# Patient Record
Sex: Female | Born: 1940 | Race: White | Hispanic: No | Smoking: Former smoker
Health system: Southern US, Community
[De-identification: ages and names within clinical notes are randomized; demographics above are authoritative.]

## PROBLEM LIST (undated history)

## (undated) DIAGNOSIS — Z8489 Family history of other specified conditions: Secondary | ICD-10-CM

## (undated) DIAGNOSIS — Z973 Presence of spectacles and contact lenses: Secondary | ICD-10-CM

## (undated) DIAGNOSIS — I10 Essential (primary) hypertension: Secondary | ICD-10-CM

## (undated) DIAGNOSIS — I714 Abdominal aortic aneurysm, without rupture, unspecified: Secondary | ICD-10-CM

## (undated) DIAGNOSIS — Z975 Presence of (intrauterine) contraceptive device: Secondary | ICD-10-CM

## (undated) DIAGNOSIS — H9190 Unspecified hearing loss, unspecified ear: Secondary | ICD-10-CM

## (undated) DIAGNOSIS — T7840XA Allergy, unspecified, initial encounter: Secondary | ICD-10-CM

## (undated) DIAGNOSIS — M8580 Other specified disorders of bone density and structure, unspecified site: Secondary | ICD-10-CM

## (undated) DIAGNOSIS — R030 Elevated blood-pressure reading, without diagnosis of hypertension: Secondary | ICD-10-CM

## (undated) DIAGNOSIS — M1990 Unspecified osteoarthritis, unspecified site: Secondary | ICD-10-CM

## (undated) DIAGNOSIS — Z972 Presence of dental prosthetic device (complete) (partial): Secondary | ICD-10-CM

## (undated) DIAGNOSIS — Z96659 Presence of unspecified artificial knee joint: Secondary | ICD-10-CM

## (undated) DIAGNOSIS — N814 Uterovaginal prolapse, unspecified: Secondary | ICD-10-CM

## (undated) DIAGNOSIS — H269 Unspecified cataract: Secondary | ICD-10-CM

## (undated) DIAGNOSIS — M810 Age-related osteoporosis without current pathological fracture: Secondary | ICD-10-CM

## (undated) HISTORY — DX: Elevated blood-pressure reading, without diagnosis of hypertension: R03.0

## (undated) HISTORY — PX: JOINT REPLACEMENT: SHX530

## (undated) HISTORY — DX: Essential (primary) hypertension: I10

## (undated) HISTORY — PX: COLONOSCOPY: SHX174

## (undated) HISTORY — DX: Uterovaginal prolapse, unspecified: N81.4

## (undated) HISTORY — PX: WISDOM TOOTH EXTRACTION: SHX21

## (undated) HISTORY — DX: Allergy, unspecified, initial encounter: T78.40XA

## (undated) HISTORY — DX: Abdominal aortic aneurysm, without rupture: I71.4

## (undated) HISTORY — DX: Abdominal aortic aneurysm, without rupture, unspecified: I71.40

## (undated) HISTORY — DX: Age-related osteoporosis without current pathological fracture: M81.0

## (undated) HISTORY — DX: Unspecified cataract: H26.9

## (undated) HISTORY — DX: Unspecified osteoarthritis, unspecified site: M19.90

## (undated) HISTORY — PX: EYE SURGERY: SHX253

## (undated) HISTORY — DX: Other specified disorders of bone density and structure, unspecified site: M85.80

---
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
## 2001-07-05 HISTORY — PX: REPLACEMENT TOTAL KNEE BILATERAL: SUR1225

## 2001-08-16 ENCOUNTER — Encounter: Payer: Self-pay | Admitting: Orthopedic Surgery

## 2001-08-21 ENCOUNTER — Inpatient Hospital Stay (HOSPITAL_COMMUNITY): Admission: RE | Admit: 2001-08-21 | Discharge: 2001-08-24 | Payer: Self-pay | Admitting: Orthopedic Surgery

## 2001-10-04 ENCOUNTER — Encounter: Payer: Self-pay | Admitting: *Deleted

## 2001-10-04 ENCOUNTER — Encounter: Admission: RE | Admit: 2001-10-04 | Discharge: 2001-10-04 | Payer: Self-pay | Admitting: *Deleted

## 2001-11-22 ENCOUNTER — Ambulatory Visit (HOSPITAL_COMMUNITY): Admission: RE | Admit: 2001-11-22 | Discharge: 2001-11-22 | Payer: Self-pay | Admitting: Gastroenterology

## 2001-12-11 ENCOUNTER — Inpatient Hospital Stay (HOSPITAL_COMMUNITY): Admission: RE | Admit: 2001-12-11 | Discharge: 2001-12-14 | Payer: Self-pay | Admitting: Orthopedic Surgery

## 2002-10-23 ENCOUNTER — Encounter: Payer: Self-pay | Admitting: *Deleted

## 2002-10-23 ENCOUNTER — Encounter: Admission: RE | Admit: 2002-10-23 | Discharge: 2002-10-23 | Payer: Self-pay | Admitting: *Deleted

## 2003-11-08 ENCOUNTER — Encounter: Admission: RE | Admit: 2003-11-08 | Discharge: 2003-11-08 | Payer: Self-pay | Admitting: Internal Medicine

## 2004-11-12 ENCOUNTER — Encounter: Admission: RE | Admit: 2004-11-12 | Discharge: 2004-11-12 | Payer: Self-pay | Admitting: Internal Medicine

## 2005-12-08 ENCOUNTER — Encounter: Admission: RE | Admit: 2005-12-08 | Discharge: 2005-12-08 | Payer: Self-pay | Admitting: Internal Medicine

## 2006-08-02 ENCOUNTER — Other Ambulatory Visit: Admission: RE | Admit: 2006-08-02 | Discharge: 2006-08-02 | Payer: Self-pay | Admitting: *Deleted

## 2006-12-12 ENCOUNTER — Encounter: Admission: RE | Admit: 2006-12-12 | Discharge: 2006-12-12 | Payer: Self-pay | Admitting: *Deleted

## 2007-12-21 ENCOUNTER — Encounter: Admission: RE | Admit: 2007-12-21 | Discharge: 2007-12-21 | Payer: Self-pay | Admitting: Family Medicine

## 2008-12-23 ENCOUNTER — Encounter: Admission: RE | Admit: 2008-12-23 | Discharge: 2008-12-23 | Payer: Self-pay | Admitting: Family Medicine

## 2009-12-24 ENCOUNTER — Encounter: Admission: RE | Admit: 2009-12-24 | Discharge: 2009-12-24 | Payer: Self-pay | Admitting: Family Medicine

## 2010-11-20 NOTE — Op Note (Signed)
. [facility]  Patient:    Abigail Cohen, Abigail Cohen Visit Number: [id number] MRN: [id number]          Service Type: SUR Location: 5000 [id number] Attending Physician:  Carolan Shiver Ii Dictated by:   Carlisle Beers. Dorothyann Gibbs, M.D. Proc. Date: 12/11/01 Admit Date:  12/11/2001                             Operative Report  PREOPERATIVE DIAGNOSIS:  Osteoarthritis, left knee.  SURGICAL PROCEDURE:  Left LCS total knee replacement, fully cemented.  SURGEON:  John L. Dorothyann Gibbs, M.D.  ASSISTANTJerolyn Shin. Tresa Res, M.D.  ANESTHESIA:  General.  PATHOLOGY:  Bone-on-bone with severe varus deformity, left knee; with subluxation of the femur medially.  POSTOPERATIVE DIAGNOSES:  Osteoarthritis, left knee.  DESCRIPTION OF PROCEDURE:  Under general anesthesia, the left leg is prepared with Duraprep and draped as a sterile field.  A proximal sterile tourniquet is used, leg prepped out with the Esmarch and the tourniquet used at 350 mmHg. The knee is exposed through a midline incision.  The patella is everted. Dissection is carried medial to the parapatella.  Due to the tight medial compartment, a release of the MCL and periosteal sleeve from the medial tibia is done.  Following this the femur is sized to standard.  Using the first tibial guide, a proximal tibial resection is carried out using the first femoral guide, then a condylar drill hole is placed using the second guide. Anterior and posterior flare of the distal femur are resected with the 17.5 flexion gap.  The intermedullary guide is then used, and a distal femoral cut is made with a 17.5 extension gap.  Recessing guide is then used.  Attention is then turned to debris, and lamina spreaders inserted.  Remnants of the menisci and cruciates are resected.  The tibia is then incised to a 2.5.  The center peg hole and keel hole are placed.  Trial reduction of a 2.5 tibia with 17.5 bearing and standard femur  reveals excellent, fit alignment and stability.  The patella is osteotomized, three peg holes are placed.  The patellar component tracks midline.  The joint capsule is clamped together, and excellent stability is seen.  Permanent components are obtained, while the bony surfaces are flushed with pulse irrigation.  Permanent components are then all cemented in place.  It should be noted there is a medial cyst in the tibia, and this is packed with bone graft.  Attention is then turned to trimming spurs along the medial tibial plateau. Permanent components are cemented in place, and after the cement hardened the excess is removed.  Tourniquet is let down at slightly more than one hour. Hemovac drain is inserted, and multiple small vessels are cauterized.  The wound is then closed in layers with #1 Tycron, #1 Vicryl and 2-0 Vicryl; then skin clips.  OPERATIVE TIME:  Approximately  1 hr 20 min.  DISPOSITION:  The patient received a femoral nerve block and returned to recovery in good condition. Dictated by:   Carlisle Beers. Dorothyann Gibbs, M.D. Attending Physician:  Carolan Shiver Ii DD:  12/11/01 TD:  12/12/01 Job: [id number]

## 2010-11-20 NOTE — Procedures (Signed)
[facility]  Patient:    CALLI, BASHOR Visit Number: [id number] MRN: [id number]          Service Type: END Location: ENDO Attending Physician:  Dennison Bulla Ii Dictated by:   Verlin Grills, M.D. Proc. Date: 11/22/01 Admit Date:  11/22/2001 Discharge Date: 11/22/2001   CC:         John L. Dorothyann Gibbs, M.D.  Forde Dandy, M.D.   Procedure Report  PROCEDURE:  Screening colonoscopy.  REFERRING PHYSICIANS:  John L. Dorothyann Gibbs, M.D., Forde Dandy, M.D.  PROCEDURE INDICATION:  Ms. Eugenia Eldredge is a 70 year old female born June 18, 1941. Ms. Sasaki is referred to me for her first screening colonoscopy with polypectomy to prevent colon cancer. Her mother has undergone colonoscopic exams to remove colon polyps. There is no family history of colon cancer.  Ms. Moye viewed the colonoscopy education film. I discussed with her the complications associated with colonoscopy and polypectomy, including a 15 per 1000 risk of bleeding and 4 per 1000 risk of colon perforation requiring surgical repair. Ms. Fogarty has signed the operative permit.  MEDICATION ALLERGIES:  PENICILLIN.  CHRONIC MEDICATIONS:  Bextra, Sudafed, Benadryl, multivitamin with calcium, aspirin.  PAST MEDICAL HISTORY:  Knee replacement surgery. Oral surgery.  FAMILY HISTORY:  Negative for colon cancer.  ENDOSCOPIST:  Verlin Grills, M.D.  PREMEDICATION:  Versed 5 mg, Demerol 50 mg.  ENDOSCOPE:  Olympus Pediatric colonoscope.  DESCRIPTION OF PROCEDURE:  After obtaining informed consent, Ms. Hunnell was placed in the left lateral decubitus position. I administered intravenous Demerol and intravenous Versed to achieve conscious sedation for the procedure. The patients blood pressure, oxygen saturation and cardiac rhythm were monitored throughout the procedure and documented in the medical record.  Anal inspection was normal. Digital rectal exam was  normal. The Olympus Pediatric video colonoscope was introduced into the rectum and advanced to the cecum. Colonic preparation for the exam today was excellent.  Rectum:  Normal.  Sigmoid colon:  Normal.  Descending colon:  Normal.  Splenic flexure:  Normal.  Transverse colon:  Normal.  Hepatic flexure:  Normal.  Ascending colon:  Normal.  Cecum and ileocecal valve:  Normal.  ASSESSMENT:  Normal screening proctocolonoscopy to the cecum. No endoscopic evidence for the presence of colorectal neoplasia. Dictated by:   Verlin Grills, M.D. Attending Physician:  Dennison Bulla Ii DD:  11/22/01 TD:  11/24/01 Job: [id number]

## 2010-11-20 NOTE — H&P (Signed)
[place]. [facility]  Patient:    Abigail Cohen, Abigail Cohen Visit Number: [id number] MRN: [id number]          Service Type: END Location: ENDO Attending Physician:  Dennison Bulla Ii Dictated by:   Jamelle Rushing, P.A. Admit Date:  11/22/2001 Discharge Date: 11/22/2001                           History and Physical  DATE OF BIRTH:  Aug 16, 1940.  CHIEF COMPLAINT:  Right knee pain for several years.  HISTORY OF PRESENT ILLNESS:  The patient is a 70 year old white female with a history of bilateral knee pain for several years.  The patient had a right total knee arthroplasty on August 21, 2001, with good results.  The patient states that the left knee pain has not improved since having the right total knee arthroplasty.  The patient indicates the pain is still present primarily while ambulating.  It is described as a sharp, stabbing-type pain located directly in the knee with any weightbearing-type activity.  It does improve when she is off her feet and using Bextra.  She does have some popping and grinding in the knee.  There is no radiation of the pain.  It does bother her at night.  She does have some swelling in the knee.  She is still currently using a cane to assist with ambulation from previous total knee arthroplasty.  X-rays revealed severe end-stage osteoarthritis with varus deformity.  ALLERGIES:  PENICILLIN.  MEDICATIONS: 1. Bextra 20 mg p.o. b.i.d. 2. Aspirin 325 mg p.o. q.d., discontinued previous to this date. 3. Multivitamins with calcium.  PAST MEDICAL HISTORY:  The patient denies any significant issues in the past with any medical concern, especially diabetes, hypertension, thyroid disease, hiatal hernia, peptic ulcer, heart disease, asthma.  PAST SURGICAL HISTORY: 1. Tonsillectomy in 1945. 2. Wisdom tooth in 2001. 3. Right total knee arthroplasty in 2003.  The patient denies any complications of the above-mentioned  surgical procedures.  SOCIAL HISTORY:  The patient is a short, obese white female, 70 years of age. Smokes approximately one-half pack of cigarettes a day for the last 40 years. Denies alcohol use.  Is widowed.  She does have two grown children.  She lives in a two-story house.  She is currently retired.  FAMILY PHYSICIAN:  Media planner at [employer].  FAMILY HISTORY:  Mother is deceased of age-related issues at 20.  Father is deceased from stroke.  The patient has one sister, who is alive and in good health.  REVIEW OF SYSTEMS:  Negative for all categories with the exception of the patient does have a partial plate and she does use glasses at all times.  PHYSICAL EXAMINATION:  VITAL SIGNS:  The patients height is 5 feet 1 inch, weight 170 pounds.  Blood pressure is 142/90, pulse of 84 and regular, respirations 12, temperature is 98.8.  GENERAL:  This is a healthy-appearing, well-developed white female, short in stature.  She walks with a significant left-sided limp.  She does carry her right leg slightly straight and stiff-legged.  She carries the cane in her right hand.  She is able to get on and off the exam table without any difficulty.  HEENT:  Head was normocephalic, atraumatic.  Nontender over the maxillary and frontal sinuses.  Pupils are equal, round and reactive, accommodating to light.  Extraocular movements intact.  Sclerae are not icteric.  Conjunctivae  are pink and moist.  Nasal septum was midline, mucous membranes pink and moist with no polyps noted.  Oral buccal mucosa was pink and moist without lesions. A partial plate was in place.  The rest of the dentition was in good repair. Uvula moved symmetrically with phonation.  The patient was able to swallow without any difficulty.  External ears were without deformities, canals patent, TMs pearly-gray and intact, gross hearing is intact.  NECK:  Supple.  No palpable lymphadenopathy.  Thyroid gland was  nontender. The patient had excellent range of motion of her cervical spine without any difficulty or tenderness.  CHEST:  Lungs were clear and equal bilaterally, no wheezes, rales, rhonchi, or rubs noted.  CARDIAC:  Regular rate and rhythm.  S1 and S2 were auscultated.  No murmurs, gallops, or rubs noted.  ABDOMEN:  Round, obese, soft, nontender to deep palpation.  Bowel sounds were normoactive throughout.  She had no tenderness to CVA percussion.  EXTREMITIES:  Upper extremities were symmetric size and shape.  She had excellent range of motion of her shoulders, elbows, and wrists without any difficulty.  Motor strength of 5/5 in all muscle groups tested.  Lower extremities:  Bilateral hips had full extension, flexion up to 110 degrees, mostly limited by abdominal girth, 20 degrees internal and external rotation without any difficulty or symptoms.  Right knee had a well-healed midline surgical incision with no sign of erythema or ecchymosis.  No palpable effusion.  Range of motion was 5-92 degrees, no valgus-varus laxity, no instability about the knee.  The calf was nontender.  Left knee had no sign of erythema or ecchymosis, no palpable effusion, was round and boggy-appearing. She did have medial and lateral joint line tenderness.  She had about 10 degrees valgus-varus laxity, no anterior or posterior drawer.  She had approximately a 7-10 degree varus deformity.  Range of motion was from 5-95 degrees.  No calf tenderness.  Bilateral ankles were symmetrical with good dorsiflexion and plantar flexion.  NEUROLOGIC:  The patient was conscious, alert, and appropriate, and held a decent conversation with the examiner.  Cranial nerves II-XII were grossly intact.  Deep tendon reflexes of the upper and lower extremities were brisk at 2+ and with generally light touch sensation is intact.  PERIPHERAL VASCULATURE:  Carotid pulses 2+, no bruits.  Radial pulses 2+. Unable to palpate femoral  pulses.  Dorsalis pedis and posterior tibial pulses were 1+.  The patient had 1+ nonpitting edema.  She had no significant  varicosity or venous stasis changes.  BREASTS, RECTAL, GENITOURINARY:  Deferred at this time.  IMPRESSION: 1. End-stage osteoarthritis, left knee, with varus deformity. 2. Obesity.  PLAN:  The patient will be admitted to [facility] on June 9 under the care of John L. Dorothyann Gibbs, M.D.  The patient will undergo all routine labs and tests prior to having a left total knee arthroplasty performed. Dictated by:   Jamelle Rushing, P.A. Attending Physician:  Dennison Bulla Ii DD:  12/05/01 TD:  12/06/01 Job: [id number]

## 2010-11-20 NOTE — Discharge Summary (Signed)
[place]. [facility]  Patient:    Abigail Cohen, CLOER Visit Number: [id number] MRN: [id number]          Service Type: SUR Location: 5000 [id number] Attending Physician:  Carolan Shiver Ii Dictated by:   Jamelle Rushing, P.A.-C. Admit Date:  12/11/2001 Discharge Date: 12/14/2001   CC:         [facility]   Discharge Summary  ADMISSION DIAGNOSES: 1. End-stage osteoarthritis left knee with varus deformity. 2. Obesity.  DISCHARGE DIAGNOSES: 1. Left total arthroplasty. 2. Obesity. 3. Urinary tract infection.  HISTORY OF PRESENT ILLNESS:  The patient is a 70 year old white female with a history of bilateral knee pain for several years.  The patient had a right total knee arthroplasty performed in February 2003 with good results.  The patient is still having left knee pain with ambulation.  The pain is described as a sharp stabbing pain with weightbearing.  It improves with Bextra.  She does have popping and grinding and swelling.  She does have night pain.  No radiation of the pain.  She is currently using a cane to assist in ambulation. X-rays revealed end-stage osteoarthritis with varus deformity.  ALLERGIES:  PENICILLIN.  MEDICATIONS: 1. Bextra 20 mg p.o. b.i.d. 2. Aspirin 325 mg q.d. 3. Multivitamin with calcium.  SURGICAL PROCEDURE:  On December 11, 2001, the patient was taken to the OR by Dr. Jonny Ruiz Rendall assisted by Dr. Vear Clock.  Under general anesthesia the patient underwent a left LCS total knee replacement, fully cemented.  The patient tolerated this procedure well.  One Hemovac drain was left in place. Operative time was 1 hour and 20 minutes and the patient did receive a postoperative femoral nerve block.  The patient was transferred to the recovery room in good condition.  CONSULTATIONS:  Routine physical therapy, occupational therapy and case management consult was requested.  HOSPITAL COURSE:  On December 11, 2001, the patient  was admitted to [facility] under the care of Dr. Jonny Ruiz Rendall.  The patient was taken to the OR where a left total knee arthroplasty was performed.  The patient received a postoperative femoral nerve block and was placed on Arixta for routine DVT prophylaxis.  The patient then incurred a total of three days of postoperative care in which she had no significant untoward events.  The patients initial urinalysis did indicate the patient had a UTI so she was placed on Cipro for management of this condition.  Her vital signs remained stable.  She did develop a low grade temperature.  Otherwise no other complaints.  Her wound remained benign for any signs of infection.  Her leg remained neuromotor and vascularly intact. The patient worked very well with physical therapy and was ready for discharge to home on postoperative #72 with no further complaints and working well with physical therapy.  LABORATORY DATA:  H&H on December 14, 2001, hemoglobin 10.0, hematocrit 28.8. Routine chemistry on December 13, 2001, sodium 137, potassium 3.7, glucose 117, BUN 11, creatinine 0.7.  Urinalysis on admission was positive for nitrates and too numerous to count bacteria.  MEDICATIONS: 1. Colace 100 mg p.o. b.i.d. 2. Senokot 8.6 mg p.o. b.i.d. 3. Arixta 2.5 mg subcu q.24h. 4. Laxative or enema of choice p.r.n. 5. Tylenol 650 mg p.o. q.4h. p.r.n. 6. Robaxin 500 mg p.o. q.6h. p.r.n. 7. Percocet one or two tablets every four to six hours p.r.n. 8. OxyContin CR 10 mg p.o. q.12h. p.r.n. 9. Cipro 500 mg  p.o. b.i.d.  DISCHARGE INSTRUCTIONS: 1. Medications:  The patient is to resume routine home medications.    Percocet 5 mg one or two tablets every four to six hours for    pain as needed.  OxyContin CR 10 mg two tablets every 12 hours    for five days and then one tablet every 12 hours for five more    days.  Arixta 2.5 mg injection for seven days and then aspirin for    the next month. 2. Pain  management as noted above. 3. Activity as tolerated. 4. Diet:  No restrictions. 5. Wound care:  Keep wound clean and dry, check daily for any    signs of infection.  Call physician if any problems. 6. Followup: Call for a followup appointment in 10 days with    Dr. Priscille Kluver.  DISCHARGE CONDITION:  The patients condition on discharge to home is listed as good. Dictated by:   Jamelle Rushing, P.A.-C. Attending Physician:  Carolan Shiver Ii DD:  01/20/02 TD:  01/26/02 Job: [id number]

## 2010-11-20 NOTE — Op Note (Signed)
[place]. [facility]  Patient:    Abigail Cohen, Abigail Cohen Visit Number: [id number] MRN: [id number]          Service Type: SUR Location: RCRM 2550 09 Attending Physician:  Carolan Shiver Ii Dictated by:   Carlisle Beers. Dorothyann Gibbs, M.D. Proc. Date: 08/21/01 Admit Date:  08/21/2001                             Operative Report  PREOPERATIVE DIAGNOSIS:  Osteoarthritis of right knee.  SURGICAL PROCEDURE:  Right LCS total knee replacement.  POSTOPERATIVE DIAGNOSIS:  Osteoarthritis of right knee.  SURGEON:  John L. Rendall III, M.D.  ASSISTANT:  Arnoldo Morale, P.A.-C  ANESTHESIA:  General.  PATHOLOGY:  Varus knee with bone-against-bone medial compartment.  All compartments are severely worn with spurs in all three compartments.  DESCRIPTION OF PROCEDURE:  Under general anesthesia, the right leg is prepared with Duraprep and draped as a sterile field.  A sterile proximal thigh tourniquet is applied.  The leg is wrapped out with the Esmarch, and the tourniquet is used to 350 mm.  A midline incision is made.  The patella is everted.  The femur is sized to a standard.  Proximal tibial resection is carried out using the tibial guide.  Using the first femoral guide, an intercondylar drill hole is placed, using the second guide, the anterior and posterior flare of the distal femur are resected with a 15 mm flexion gap. The intramedullary guide is then used, and a distal femoral cut is made with a 15 mm extension gap.  Recessing guide is then used.  Following this, the lamina spreader is inserted, the remnants of the cruciate and menisci are resected, spurs off the back of the femoral condyle, and a large loose body from the popliteal space is removed.  Following this, the tibia is exposed. It is sized to 2.5.  A center peg hole is placed with fins.  Trial reduction of a 2.5 tibia 15 mm bearing, standard femur reveals laxity medially, and a 17.5 bearing is then used, and  excellent balancing is then encountered.  The patella is osteotomized.  Three peg holes are placed.  The permanent components are then cemented in place after trial reduction revealed excellent fit and alignment.  Tourniquet is let down at approximately 1 hour.  The medium Hemovac drain is inserted.  The wound is then closed in layers after completing cauterization of multiple small vessels.  Leg is closed in layers of #1 surgidac, 0 and 2-0 Vicryl, and skin clips.  Operative time was 1 hour and 25 minutes.  The patient tolerated the procedure well, and returned to recovery in good condition. Dictated by:   Carlisle Beers. Dorothyann Gibbs, M.D. Attending Physician:  Carolan Shiver Ii DD:  08/21/01 TD:  08/21/01 Job: [id number]

## 2010-11-20 NOTE — H&P (Signed)
[place]  Patient:    Abigail Cohen, Abigail Cohen. Visit Number: [id number] MRN: [id number]          Service Type: Attending:  Carlisle Beers. Dorothyann Gibbs, M.D. Dictated by:   Jamelle Rushing, P.A. Adm. Date:  08/21/01                           History and Physical  DATE OF BIRTH:  April 24, 1941  CHIEF COMPLAINT:  Bilateral knee pain several years.  HISTORY OF PRESENT ILLNESS:  The patient is a 70 year old white female with bilateral knee pain for many years.  The patient states around the first of this year she banged her right knee, causing significant increase in pain and difficulty with range of motion.  The patient went to her primary care physician with [facility] in [place].  She had evaluations and had recommendations for further evaluations by an orthopedist.  The patient states that she has sharp pain in bilateral knees with weight bearing, that it is currently improving somewhat with Bextra.  She does have significant popping, grinding in the knees with any type of weight-bearing activity or range of motion of the knee.  She does have swelling.  She does have occasional night pain.  The patient is currently using a brace on her right knee for one month to improve her discomfort in her right knee.  The patient states that bilateral knee pain is currently worse in the right than the left.  There is no radiation of this discomfort.  X-rays reveal severe end-stage osteoarthritis, bilateral knees with a varus deformity bilaterally.  There is subluxation of the femur medially over the tibia bilaterally, greater on the left than the right.  ALLERGIES:  PENICILLIN.  CURRENT MEDICATIONS: 1. Bextra 20 mg p.o. b.i.d. 2. Aspirin 325 mg p.o. q.d. 3. Multivitamin with calcium.  MEDICAL HISTORY:  The patient denies any significant medical issues including diabetes, hypertension, thyroid disease, hiatal hernia, peptic ulcers, heart disease,  or asthma.  SURGICAL HISTORY: 1. Tonsillectomy in 1945. 2. Oral wisdom tooth surgery in 2001.  The patient denies any complications with any of the above mentioned surgical procedures.  SOCIAL HISTORY:  The patient is a short in stature, slightly heavy set white female, who states she currently smokes less than one half pack a day for the last 40 years.  She denies any alcohol.  She is widowed.  She does have two grown children.  She lives in a two story house with complete living facilities on one floor.  She is currently retired.  FAMILY PHYSICIAN:  Media planner at [employer].  Cardiology evaluation on August 15, 2001, by Dr. Katrinka Blazing with an adenosine Cardiolite test and stress test.  FAMILY MEDICAL HISTORY:  Mother is deceased with age related issues at 61 years of age.  Father is deceased from stroke complications.  The patient has got one sister alive and in good health.  REVIEW OF SYSTEMS:  Negative for any significant issues in general, respiratory, cardiac, GI, GU, hematologic, musculoskeletal, neurologic, or muscles, or mental status issues.  PHYSICAL EXAMINATION:  VITAL SIGNS:  Height is 5 feet 1 inch, weight is 175, pulse 92 and regular, respirations 12, temperature is 98.8, blood pressure is 128/88.  GENERAL:  This is a healthy-appearing white female, short in stature, slightly obese.  She does ambulate with a significant right-sided limp.  She appears to be bowlegged.  She does have long leg immobilizer on her right lower extremity.  She has a fair amount of difficulty getting on and off the exam table.  HEENT:  Head is normocephalic, atraumatic, nontender over maxillary or frontal sinuses.  Pupils are equal, round, and reactive, accommodating to light. Extraocular movements intact.  Sclerae are nonicteric.  Conjunctivae are pink and moist.  External ears without deformities.  Canals patent on the right, cerumen impacted on the left.  Gross hearing  is intact.  TMs are intact. Nasal septum was midline.  Mucous membranes pink and moist, no polyps noted. Oral buccal mucosa was pink and moist without lesions.  Dentition was in good repair.  Uvula was midline and the patient was able to swallow without any difficulty.  CHEST:  Lung sounds were clear and equal bilaterally.  No wheezes, rales, rhonchi, or rubs noted.  HEART:  Regular rate and rhythm, S1 and S2 were auscultated.  No murmurs, rubs, or gallops noted.  ABDOMEN:  Round, obese, soft, nontender to deep palpation.  Unable to palpate any hepatosplenomegaly.  SCVA was nontender to percussion.  Bowel sounds were normoactive throughout all four quadrants.  EXTREMITIES:  Upper extremities were symmetrically sized and shaped with full range of motion of shoulders, elbows, and wrists without any difficulty. Motor strength was 5/5.  Lower extremities:  Right and left hips had full range of motion with extension and flexion at 20 degrees internal/external rotation without any mechanical symptoms or difficulty.  Right knee had a long leg immobilizer on. With the removal of immobilizer, range of motion was 5 degrees to 90 degrees. She was tender along the medial and lateral joint line.  No signs of erythema or ecchymosis.  She had very coarse crepitus under the kneecap with her range of motion.  She had no anterior or posterior draw.  No significant valgus/varus laxity.  Left knee had no sign of erythema or ecchymosis.  No palpable effusion.  She did have medial and lateral joint line tenderness. She had significant coarse crepitus under the patella with range of motion, which was about 2 degrees to 90 degrees.  The calves were nontender. Bilateral ankles were symmetrical with good dorsiplantar flexion.  Peripheral vasculature:  Carotid pulses 2+, no bruits.  Radial pulses 2+, unable to palpate femoral pulses or posterior tibial.  The dorsalis pedis pulses were 1+.  She had no lower  extremity varicosities or pigmentation changes.  She had slight nonpitting edema.   NEUROLOGIC:  The patient was conscious, alert, and appropriate, held an easy conversation with the examiner.  Cranial nerves II-XII were grossly intact. Deep tendon reflexes of the upper and lower extremities were symmetrical, right to left brisk.  The patient was grossly intact to light touch and sensation from head to toe.  BREASTS/RECTAL/GENITOURINARY:  Exams deferred at this time.  IMPRESSION: 1. Severe end-stage osteoarthritis, bilateral knees with varus deformity,    right more symptomatic than the left. 2. Obesity. 3. Tobacco use.  PLAN:  The patient will be admitted to [facility] on August 21, 2001.  The patient has had a physical exam by her primary care physician and she has also had a cardiac workup by Dr. Katrinka Blazing.  I presently do not have these results, but will receive them prior to hospitalization and they will be forwarded to the patients chart.  The patient will undergo all of the routine labs and tests prior to this surgical procedure of a right total knee arthroplasty February 17. Dictated  by:   Jamelle Rushing, P.A. Attending:  Carlisle Beers. Dorothyann Gibbs, M.D. DD:  08/15/01 TD:  08/15/01 Job: [id number]

## 2010-11-20 NOTE — Discharge Summary (Signed)
[place]. [facility]  Patient:    Abigail Cohen, Abigail Cohen Visit Number: [id number] MRN: [id number]          Service Type: SUR Location: [id number] Attending Physician:  Carolan Shiver Ii Dictated by:   Jamelle Rushing, P.A.-C Admit Date:  12/11/2001 Discharge Date: 12/14/2001                             Discharge Summary  ADMISSION DIAGNOSES: 1. Severe end stage osteoarthritis of bilateral knees with varus deformity,    currently right knee more symptomatic than the left. 2. Obesity. 3. Tobacco use.  DISCHARGE DIAGNOSES: 1. Right total knee arthroplasty. 2. Obesity. 3. Tobacco use.  HISTORY OF PRESENT ILLNESS:  The patient is a 70 year old white female with bilateral knee pain for several years now.  The patient recently banged her right knee and has had significant increase in pain and difficulty with range of motion in that knee.  The patient has been evaluated in the past by her primary care physician, and recommended for an orthopedic evaluation.  The patient presents presently with right greater than left knee pain, but both have sharp shooting pains with any type of weightbearing activity and the increased amount of time on her feet.  The patient denies any radiation or pain.  She does have popping and grinding and slight swelling.  She does have significant night pain.  The patient has noted an increasing deformity in bilateral knees.  X-ray reveal end stage osteoarthritis bilateral knees, varus deformity with a subluxed femur medially over the tibia, left worse than right.  ALLERGIES:  PENICILLIN.  CURRENT MEDICATIONS: 1. Bextra 20 mg p.o. b.i.d. 2. Aspirin 325 mg p.o. q.d. 3. Multivitamin with calcium.  SURGICAL PROCEDURE:  On August 21, 2001, the patient was taken to the operating room by Dr. Jonny Ruiz L. Rendall, assisted by Arnoldo Morale, P.A.-C.  Under general anesthesia, the patient underwent a right LCS total knee replacement. The  patient tolerated the procedure well.  Operative time was 1 hour and 25 minutes.  The patient had a femoral nerve block placed prior to being discharged to the recovery room and then to the orthopedic floor.  CONSULTATIONS:  Physical therapy, occupational therapy, rehab, and case management.  HOSPITAL COURSE:  On August 21, 2001, the patient was admitted to [facility] under the care of Dr. Jonny Ruiz L. Rendall.  The patient was taken to the OR where a right total knee LCS knee replacement was performed.  The patient tolerated the procedure well.  Femoral nerve block was placed, and the patient was transferred to the recovery room and then to the orthopedic floor for routine postoperative care.  The patient was placed on Erixstra 2.5 mg subcutaneously q.d. for routine deep vein thrombosis prophylaxis.  The patient had an uneventful three day postoperative course on the orthopedic floor.  Her vital signs remained stable.  She remained afebrile with only a small low grade temperature on postoperative day #2.  Her leg remained neuromotorvascularly intact.  Her wound remained benign for any signs of infection.  She worked very well with physical therapy, and on postoperative day #3, after physical therapy in the morning and the afternoon the patient was comfortable and she was discharged to home in good condition.  LABORATORY DATA:  EKG on admission was normal sinus rhythm, possible left atrial enlargement at 89 beats per minute.  CBC on August 24, 2001,  white blood cell count 10.3, hemoglobin 9.2, hematocrit 26.7, platelets of 142.  The patient was asymptomatic for any signs of decrease in hemoglobin and hematocrit.  Routine chemistry on August 24, 2001, sodium 136, potassium 3.7, glucose 122, down from 141 the day previous, BUN 6, creatinine 0.7, increased glucoses due to stress of surgery.  Routine urinalysis on admission found many epithelial cells, large  leukocyte esterase, negative nitrites, many bacteria.  This was thought to be a contaminated specimen, and she also received routine postoperative antibiotics.  The patient denied any symptoms.  DISCHARGE MEDICATIONS FROM ORTHOPEDIC FLOOR:  1. Colace 100 mg p.o. b.i.d.  2. Senokot one tablet p.o. b.i.d. a.c.  3. Laxative or enema of choice p.r.n.  4. Reglan 10 mg p.o. q.8h. p.r.n.  5. Percocet one or two tablets q.4-6h. p.r.n.  6. Tylenol 650 mg p.o. q.4h. p.r.n.  7. Robaxin 500 mg p.o. q.6h. p.r.n.  8. Restoril 15 mg p.o. q.h.s. p.r.n.  9. Erixstra 2.5 mg subcutaneously q.24h. 10. Potassium chloride 20 mEq p.o. b.i.d. 11. OxyContin CR 10 mg p.o. q.12h.  DISCHARGE MEDICATIONS: 1. The patient is to resume all routine home medications. 2. OxyContin CR one tablet q.12h. 3. Oxy IR 5 mg one or two tablets q.4-6h. p.r.n. pain. 4. Trinsicon one tablet t.i.d. after meals. 5. Ambrose Mantle one injection for 7 days, then restart aspirin after completed    [place].  ACTIVITY:  As tolerated with the use of a walker, no driving, but may shower.  DIET:  No restrictions.  WOUND CARE:  Check daily for any signs of infection as instructed by physician.  If any problems, notify M.D.  [name]:  Call [phone] or the [facility] for a followup in 10 days.  CONDITION ON DISCHARGE:  Good. Dictated by:   Jamelle Rushing, P.A.-C Attending Physician:  Carolan Shiver Ii DD:  09/30/01 TD:  10/01/01 Job: [id number]

## 2011-01-11 ENCOUNTER — Other Ambulatory Visit: Payer: Self-pay | Admitting: Family Medicine

## 2011-01-11 DIAGNOSIS — Z1231 Encounter for screening mammogram for malignant neoplasm of breast: Secondary | ICD-10-CM

## 2011-01-21 ENCOUNTER — Ambulatory Visit
Admission: RE | Admit: 2011-01-21 | Discharge: 2011-01-21 | Disposition: A | Discharge status: Home / Self Care | Payer: Medicare Other | Source: Ambulatory Visit | Attending: Family Medicine | Admitting: Family Medicine

## 2011-01-21 DIAGNOSIS — Z1231 Encounter for screening mammogram for malignant neoplasm of breast: Secondary | ICD-10-CM

## 2011-02-03 ENCOUNTER — Other Ambulatory Visit (HOSPITAL_COMMUNITY)
Admission: RE | Admit: 2011-02-03 | Discharge: 2011-02-03 | Disposition: A | Discharge status: Home / Self Care | Payer: Medicare Other | Source: Ambulatory Visit | Attending: Family Medicine | Admitting: Family Medicine

## 2011-02-03 ENCOUNTER — Other Ambulatory Visit: Payer: Self-pay | Admitting: Physician Assistant

## 2011-02-03 DIAGNOSIS — Z124 Encounter for screening for malignant neoplasm of cervix: Secondary | ICD-10-CM | POA: Insufficient documentation

## 2011-07-28 DIAGNOSIS — N813 Complete uterovaginal prolapse: Secondary | ICD-10-CM | POA: Diagnosis not present

## 2011-07-28 DIAGNOSIS — N898 Other specified noninflammatory disorders of vagina: Secondary | ICD-10-CM | POA: Diagnosis not present

## 2011-07-28 DIAGNOSIS — N816 Rectocele: Secondary | ICD-10-CM | POA: Diagnosis not present

## 2011-07-28 DIAGNOSIS — N814 Uterovaginal prolapse, unspecified: Secondary | ICD-10-CM | POA: Diagnosis not present

## 2011-11-05 DIAGNOSIS — N952 Postmenopausal atrophic vaginitis: Secondary | ICD-10-CM | POA: Diagnosis not present

## 2011-11-05 DIAGNOSIS — N819 Female genital prolapse, unspecified: Secondary | ICD-10-CM | POA: Diagnosis not present

## 2012-01-14 ENCOUNTER — Other Ambulatory Visit: Payer: Self-pay | Admitting: Family Medicine

## 2012-01-14 DIAGNOSIS — Z1231 Encounter for screening mammogram for malignant neoplasm of breast: Secondary | ICD-10-CM

## 2012-02-01 ENCOUNTER — Ambulatory Visit: Payer: Medicare Other

## 2012-02-09 ENCOUNTER — Ambulatory Visit
Admission: RE | Admit: 2012-02-09 | Discharge: 2012-02-09 | Disposition: A | Discharge status: Home / Self Care | Payer: Medicare Other | Source: Ambulatory Visit | Attending: Family Medicine | Admitting: Family Medicine

## 2012-02-09 DIAGNOSIS — Z1231 Encounter for screening mammogram for malignant neoplasm of breast: Secondary | ICD-10-CM

## 2012-02-11 DIAGNOSIS — Z4689 Encounter for fitting and adjustment of other specified devices: Secondary | ICD-10-CM | POA: Diagnosis not present

## 2012-02-11 DIAGNOSIS — N816 Rectocele: Secondary | ICD-10-CM | POA: Diagnosis not present

## 2012-02-11 DIAGNOSIS — N813 Complete uterovaginal prolapse: Secondary | ICD-10-CM | POA: Diagnosis not present

## 2012-02-24 DIAGNOSIS — N952 Postmenopausal atrophic vaginitis: Secondary | ICD-10-CM | POA: Diagnosis not present

## 2012-02-24 DIAGNOSIS — Z8049 Family history of malignant neoplasm of other genital organs: Secondary | ICD-10-CM | POA: Diagnosis not present

## 2012-03-02 DIAGNOSIS — Z0000 Encounter for general adult medical examination without abnormal findings: Secondary | ICD-10-CM | POA: Diagnosis not present

## 2012-04-05 DIAGNOSIS — M899 Disorder of bone, unspecified: Secondary | ICD-10-CM | POA: Diagnosis not present

## 2012-04-05 DIAGNOSIS — M949 Disorder of cartilage, unspecified: Secondary | ICD-10-CM | POA: Diagnosis not present

## 2012-05-08 DIAGNOSIS — N819 Female genital prolapse, unspecified: Secondary | ICD-10-CM | POA: Diagnosis not present

## 2012-05-08 DIAGNOSIS — Z4689 Encounter for fitting and adjustment of other specified devices: Secondary | ICD-10-CM | POA: Diagnosis not present

## 2012-06-07 DIAGNOSIS — Z23 Encounter for immunization: Secondary | ICD-10-CM | POA: Diagnosis not present

## 2012-06-12 DIAGNOSIS — K4090 Unilateral inguinal hernia, without obstruction or gangrene, not specified as recurrent: Secondary | ICD-10-CM | POA: Diagnosis not present

## 2012-06-16 ENCOUNTER — Encounter (INDEPENDENT_AMBULATORY_CARE_PROVIDER_SITE_OTHER): Payer: Self-pay | Admitting: General Surgery

## 2012-06-20 ENCOUNTER — Encounter (INDEPENDENT_AMBULATORY_CARE_PROVIDER_SITE_OTHER): Payer: Self-pay | Admitting: Surgery

## 2012-06-20 ENCOUNTER — Ambulatory Visit (INDEPENDENT_AMBULATORY_CARE_PROVIDER_SITE_OTHER): Payer: Medicare Other | Admitting: Surgery

## 2012-06-20 VITALS — BP 124/72 | HR 92 | Temp 97.5°F | Resp 16 | Ht <= 58 in | Wt 136.8 lb

## 2012-06-20 DIAGNOSIS — K4090 Unilateral inguinal hernia, without obstruction or gangrene, not specified as recurrent: Secondary | ICD-10-CM | POA: Diagnosis not present

## 2012-06-20 NOTE — Progress Notes (Signed)
NAME: Abigail Cohen DOB: 08/30/1940 MRN: [id number]                                                                                      DATE: 06/20/2012  PCP: OWENS,REBECCA, FNP Referring Provider: Owens, Rebecca, FNP  IMPRESSION:  Reducible likely direct left inguinal hernia  PLAN:   repair of left inguinal hernia. We can do this as an outpatient under general or IV sedation and local anesthesia. We'll set this up to be done at her convenience. I have discussed with the patient the fact that he has an inguinal hernia and reviewed the pathophysiology of that diagnosis. I have given him educational materials about this. I discussed options for treatment including observation or repair and have discussed both laparoscopic and open inguinal hernia repairs as potential techniques. We have discussed the use of mesh. We have discussed risks of surgery including bleeding, infection, recurrence, postoperative pain and possible chronic groin pain, testicular injury, and potential issues with voiding. I believe the patient's questions have been answered and that he has a good understanding of the issues                  CC:  Chief Complaint  Patient presents with  . Inguinal Hernia    Left    HPI:  Abigail Cohen is a 71 y.o.  female who presents for evaluation of A reducible left inguinal hernia. She pointed this out to her primary care physician on her recent exam. It is not that symptomatic and has been present for close to a year. She is not clear whether it is enlarged a little bit. Surgical evaluation was requested to evaluate for possible repair.  PMH:  has a past medical history of Osteopenia; Osteoarthritis; Borderline hypertension; and Uterine prolapse.  PSH:   has past surgical history that includes Replacement total knee bilateral (2003).  ALLERGIES:  No Known Allergies  MEDICATIONS: Current outpatient prescriptions:aspirin 81 MG tablet, Take 81 mg by mouth daily., Disp: , Rfl: ;   CALCIUM-VITAMIN D PO, Take 1,500 mg by mouth daily., Disp: , Rfl: ;  Docusate Calcium (STOOL SOFTENER PO), Take 100 mg by mouth daily., Disp: , Rfl: ;  estradiol (ESTRACE) 0.1 MG/GM vaginal cream, Place 2 g vaginally 2 (two) times a week., Disp: , Rfl: ;  Multiple Vitamin (MULTIVITAMIN) tablet, Take 1 tablet by mouth daily., Disp: , Rfl:  OVER THE COUNTER MEDICATION, citrical + vit D, Disp: , Rfl:   ROS: She has filled out our 12 point review of systems and it is negative . EXAM:   VITAL SIGNS: BP 124/72  Pulse 92  Temp 97.5 F (36.4 C) (Temporal)  Resp 16  Ht 4' 10" (1.473 m)  Wt 136 lb 12.8 oz (62.052 kg)  BMI 28.59 kg/m2 GENERAL:  The patient is alert, oriented, and generally healthy-appearing, NAD. Mood and affect are normal.  HEENT:  The head is normocephalic, the eyes nonicteric, the pupils were round regular and equal. EOMs are normal. Pharynx normal. Dentition good.  NECK:  The neck is supple and there are no masses or thyromegaly.  LUNGS: Normal respirations and clear to   auscultation.  HEART: Regular rhythm, with no murmurs rubs or gallops. Pulses are intact carotid dorsalis pedis and posterior tibial. No significant varicosities are noted.   ABDOMEN: Soft, flat, and nontender. No masses or organomegaly is noted. . Bowel sounds are normal.there is a reducible, moderately sized, left inguinal hernia, probably direct. Nontender. There is no right inguinal hernia and there is no umbilical hernia that I can appreciate today.  EXTREMITIES:  Good range of motion, mild bilateral lower extremity edema. Well-healed surgical scars from her knee replacements bilaterally   DATA REVIEWED:  I looked over the notes from her primary physician.    Kamarion Zagami J 06/20/2012  CC: Owens, Rebecca, FNP, OWENS,REBECCA, FNP        

## 2012-06-20 NOTE — Patient Instructions (Signed)
We will schedule surgery to repair the hernia in the left lower part of your abdomen

## 2012-06-30 ENCOUNTER — Encounter (HOSPITAL_BASED_OUTPATIENT_CLINIC_OR_DEPARTMENT_OTHER): Payer: Self-pay | Admitting: *Deleted

## 2012-06-30 NOTE — Progress Notes (Signed)
No labs needed Pt has had both knees replaced 2003-had a stress test prior to those-all normal-no f/u needed

## 2012-07-04 DIAGNOSIS — K5730 Diverticulosis of large intestine without perforation or abscess without bleeding: Secondary | ICD-10-CM | POA: Diagnosis not present

## 2012-07-04 DIAGNOSIS — Z1211 Encounter for screening for malignant neoplasm of colon: Secondary | ICD-10-CM | POA: Diagnosis not present

## 2012-07-04 LAB — HM COLONOSCOPY

## 2012-07-07 ENCOUNTER — Encounter (HOSPITAL_BASED_OUTPATIENT_CLINIC_OR_DEPARTMENT_OTHER)
Admission: RE | Disposition: A | Discharge status: Home / Self Care | Payer: Self-pay | Source: Ambulatory Visit | Attending: Surgery

## 2012-07-07 ENCOUNTER — Encounter (HOSPITAL_BASED_OUTPATIENT_CLINIC_OR_DEPARTMENT_OTHER): Payer: Self-pay | Admitting: Certified Registered Nurse Anesthetist

## 2012-07-07 ENCOUNTER — Ambulatory Visit (HOSPITAL_BASED_OUTPATIENT_CLINIC_OR_DEPARTMENT_OTHER): Payer: Medicare Other | Admitting: Certified Registered Nurse Anesthetist

## 2012-07-07 ENCOUNTER — Ambulatory Visit (HOSPITAL_BASED_OUTPATIENT_CLINIC_OR_DEPARTMENT_OTHER)
Admission: RE | Admit: 2012-07-07 | Discharge: 2012-07-07 | Disposition: A | Discharge status: Home / Self Care | Payer: Medicare Other | Source: Ambulatory Visit | Attending: Surgery | Admitting: Surgery

## 2012-07-07 ENCOUNTER — Encounter (HOSPITAL_BASED_OUTPATIENT_CLINIC_OR_DEPARTMENT_OTHER): Payer: Self-pay

## 2012-07-07 DIAGNOSIS — Z7982 Long term (current) use of aspirin: Secondary | ICD-10-CM | POA: Diagnosis not present

## 2012-07-07 DIAGNOSIS — R030 Elevated blood-pressure reading, without diagnosis of hypertension: Secondary | ICD-10-CM | POA: Diagnosis not present

## 2012-07-07 DIAGNOSIS — K4090 Unilateral inguinal hernia, without obstruction or gangrene, not specified as recurrent: Secondary | ICD-10-CM | POA: Insufficient documentation

## 2012-07-07 DIAGNOSIS — Z96659 Presence of unspecified artificial knee joint: Secondary | ICD-10-CM | POA: Insufficient documentation

## 2012-07-07 DIAGNOSIS — M1990 Unspecified osteoarthritis, unspecified site: Secondary | ICD-10-CM | POA: Diagnosis not present

## 2012-07-07 DIAGNOSIS — M899 Disorder of bone, unspecified: Secondary | ICD-10-CM | POA: Insufficient documentation

## 2012-07-07 DIAGNOSIS — M949 Disorder of cartilage, unspecified: Secondary | ICD-10-CM | POA: Diagnosis not present

## 2012-07-07 HISTORY — PX: INGUINAL HERNIA REPAIR: SHX194

## 2012-07-07 HISTORY — DX: Presence of spectacles and contact lenses: Z97.3

## 2012-07-07 HISTORY — DX: Presence of dental prosthetic device (complete) (partial): Z97.2

## 2012-07-07 HISTORY — DX: Presence of unspecified artificial knee joint: Z96.659

## 2012-07-07 HISTORY — DX: Family history of other specified conditions: Z84.89

## 2012-07-07 HISTORY — PX: HERNIA REPAIR: SHX51

## 2012-07-07 SURGERY — REPAIR, HERNIA, INGUINAL, ADULT
Anesthesia: Monitor Anesthesia Care | Site: Abdomen | Laterality: Left | Wound class: Clean

## 2012-07-07 MED ORDER — PROPOFOL 10 MG/ML IV EMUL
INTRAVENOUS | Status: DC | PRN
  Administered 2012-07-07: 75 ug/kg/min via INTRAVENOUS

## 2012-07-07 MED ORDER — PHENYLEPHRINE HCL 10 MG/ML IJ SOLN
INTRAMUSCULAR | Status: DC | PRN
  Administered 2012-07-07: 40 ug via INTRAVENOUS
  Administered 2012-07-07: 80 ug via INTRAVENOUS

## 2012-07-07 MED ORDER — MIDAZOLAM HCL 2 MG/2ML IJ SOLN: 0.5000 mg | Freq: Once | INTRAMUSCULAR | Status: DC | PRN

## 2012-07-07 MED ORDER — OXYCODONE HCL 5 MG PO TABS: 5.0000 mg | ORAL_TABLET | Freq: Once | ORAL | Status: DC | PRN

## 2012-07-07 MED ORDER — LIDOCAINE HCL (CARDIAC) 20 MG/ML IV SOLN
INTRAVENOUS | Status: DC | PRN
  Administered 2012-07-07: 50 mg via INTRAVENOUS

## 2012-07-07 MED ORDER — FENTANYL CITRATE 0.05 MG/ML IJ SOLN
INTRAMUSCULAR | Status: DC | PRN
  Administered 2012-07-07: 100 ug via INTRAVENOUS

## 2012-07-07 MED ORDER — CEFAZOLIN SODIUM-DEXTROSE 2-3 GM-% IV SOLR
INTRAVENOUS | Status: DC | PRN
  Administered 2012-07-07: 2 g via INTRAVENOUS

## 2012-07-07 MED ORDER — FENTANYL CITRATE 0.05 MG/ML IJ SOLN: 25.0000 ug | INTRAMUSCULAR | Status: DC | PRN

## 2012-07-07 MED ORDER — CHLORHEXIDINE GLUCONATE 4 % EX LIQD: 1.0000 "application " | Freq: Once | CUTANEOUS | Status: DC

## 2012-07-07 MED ORDER — HYDROCODONE-ACETAMINOPHEN 5-325 MG PO TABS: 1.0000 | ORAL_TABLET | ORAL | Status: DC | PRN

## 2012-07-07 MED ORDER — PROMETHAZINE HCL 25 MG/ML IJ SOLN: 6.2500 mg | INTRAMUSCULAR | Status: DC | PRN

## 2012-07-07 MED ORDER — OXYCODONE HCL 5 MG/5ML PO SOLN: 5.0000 mg | Freq: Once | ORAL | Status: DC | PRN

## 2012-07-07 MED ORDER — LACTATED RINGERS IV SOLN
INTRAVENOUS | Status: DC
  Administered 2012-07-07 (×2): via INTRAVENOUS

## 2012-07-07 MED ORDER — ACETAMINOPHEN 10 MG/ML IV SOLN
1000.0000 mg | Freq: Once | INTRAVENOUS | Status: AC
  Administered 2012-07-07: 1000 mg via INTRAVENOUS

## 2012-07-07 MED ORDER — PROPOFOL 10 MG/ML IV BOLUS
INTRAVENOUS | Status: DC | PRN
  Administered 2012-07-07 (×4): 20 mg via INTRAVENOUS

## 2012-07-07 MED ORDER — MEPERIDINE HCL 25 MG/ML IJ SOLN: 6.2500 mg | INTRAMUSCULAR | Status: DC | PRN

## 2012-07-07 MED ORDER — MIDAZOLAM HCL 5 MG/5ML IJ SOLN
INTRAMUSCULAR | Status: DC | PRN
  Administered 2012-07-07: 2 mg via INTRAVENOUS

## 2012-07-07 MED ORDER — LIDOCAINE-EPINEPHRINE (PF) 1 %-1:200000 IJ SOLN
INTRAMUSCULAR | Status: DC | PRN
  Administered 2012-07-07: 15:00:00 via INTRAMUSCULAR

## 2012-07-07 SURGICAL SUPPLY — 42 items
BLADE HEX COATED 2.75 (ELECTRODE) ×2 IMPLANT
BLADE SURG 10 STRL SS (BLADE) ×2 IMPLANT
BLADE SURG ROTATE 9660 (MISCELLANEOUS) ×2 IMPLANT
CHLORAPREP W/TINT 26ML (MISCELLANEOUS) ×2 IMPLANT
CLIP TI WIDE RED SMALL 6 (CLIP) IMPLANT
CLOTH BEACON ORANGE TIMEOUT ST (SAFETY) ×2 IMPLANT
COVER MAYO STAND STRL (DRAPES) ×2 IMPLANT
COVER TABLE BACK 60X90 (DRAPES) ×2 IMPLANT
DECANTER SPIKE VIAL GLASS SM (MISCELLANEOUS) IMPLANT
DERMABOND ADVANCED (GAUZE/BANDAGES/DRESSINGS) ×1
DERMABOND ADVANCED .7 DNX12 (GAUZE/BANDAGES/DRESSINGS) ×1 IMPLANT
DRAIN PENROSE 1/2X12 LTX STRL (WOUND CARE) ×2 IMPLANT
DRAPE LAPAROTOMY TRNSV 102X78 (DRAPE) ×2 IMPLANT
DRAPE UTILITY XL STRL (DRAPES) ×2 IMPLANT
ELECT REM PT RETURN 9FT ADLT (ELECTROSURGICAL) ×2
ELECTRODE REM PT RTRN 9FT ADLT (ELECTROSURGICAL) ×1 IMPLANT
GLOVE BIOGEL PI IND STRL 7.5 (GLOVE) ×1 IMPLANT
GLOVE BIOGEL PI INDICATOR 7.5 (GLOVE) ×1
GLOVE EUDERMIC 7 POWDERFREE (GLOVE) ×2 IMPLANT
GLOVE SKINSENSE NS SZ7.0 (GLOVE) ×1
GLOVE SKINSENSE STRL SZ7.0 (GLOVE) ×1 IMPLANT
GOWN PREVENTION PLUS XLARGE (GOWN DISPOSABLE) ×4 IMPLANT
MESH ULTRAPRO 3X6 7.6X15CM (Mesh General) ×2 IMPLANT
NEEDLE HYPO 22GX1.5 SAFETY (NEEDLE) ×2 IMPLANT
NEEDLE HYPO 25X1 1.5 SAFETY (NEEDLE) ×2 IMPLANT
NS IRRIG 1000ML POUR BTL (IV SOLUTION) IMPLANT
PACK BASIN DAY SURGERY FS (CUSTOM PROCEDURE TRAY) ×2 IMPLANT
PENCIL BUTTON HOLSTER BLD 10FT (ELECTRODE) ×2 IMPLANT
SLEEVE SCD COMPRESS KNEE MED (MISCELLANEOUS) IMPLANT
SPONGE INTESTINAL PEANUT (DISPOSABLE) ×2 IMPLANT
SPONGE LAP 4X18 X RAY DECT (DISPOSABLE) ×2 IMPLANT
SUT MNCRL AB 4-0 PS2 18 (SUTURE) ×2 IMPLANT
SUT PROLENE 2 0 CT2 30 (SUTURE) ×4 IMPLANT
SUT SILK 2 0 SH (SUTURE) IMPLANT
SUT VIC AB 3-0 CT1 27 (SUTURE) ×2
SUT VIC AB 3-0 CT1 27XBRD (SUTURE) ×2 IMPLANT
SUT VIC AB 4-0 BRD 54 (SUTURE) ×2 IMPLANT
SYR CONTROL 10ML LL (SYRINGE) ×2 IMPLANT
TAPE HYPAFIX 4 X10 (GAUZE/BANDAGES/DRESSINGS) IMPLANT
TOWEL OR 17X24 6PK STRL BLUE (TOWEL DISPOSABLE) ×2 IMPLANT
TOWEL OR NON WOVEN STRL DISP B (DISPOSABLE) ×2 IMPLANT
WATER STERILE IRR 1000ML POUR (IV SOLUTION) ×2 IMPLANT

## 2012-07-07 NOTE — Op Note (Signed)
[name] 20-Sep-1940 [id number] 06/20/2012  Preoperative diagnosis: direct left inguinal hernia  Postoperative diagnosis: same  Procedure: repair of direct left inguinal hernia with mesh  Surgeon: Currie Paris, MD, FACS  Anesthesia:MAC  Clinical History and Indications: this patient has presented with a bulge in the left angle area consistent with a large direct inguinal hernia that reduced. She elected to have this repaired.  Description of Procedure:The patient was seen in the holding area and the plans for the procedure as noted above confirmed with the patient. We reviewed again the risks and complications and the patient had no further questions. I then marked the left  as the operative side. This was confirmed with the patient. She wishes to proceed.  The patient was taken to the operating room and after satisfactory MAC anesthesia was obtained the left  inguinal area was prepped and draped as a sterile field. A time out was done.  I used an equal mixture of 0.5% plain Marcaine and 1% Xylocaine with epinephrine for local anesthesia and to help with postoperative pain management. The area of the incision was infiltrated first as well as a field block going medially and inferiorly. I also injected some below the external oblique aponeurosis at the level of the anterior superior iliac spine.  An oblique incision was made and deepened to the external oblique aponeurosis. Bleeders were either cauterized or tied with 3-0 Vicryl. The external oblique aponeurosis was opened in the line of its fibers and elevated off of the underlying tissue.  After opening the external oblique the line of its fibers there was a large direct hernia present. This was stripped off of the surrounding underlying surface of the external oblique and freed up. The entire floor was involved. I was able to reduce this and then closed the transversalis and internal oblique with a running 2-0 Prolene to keep the  hernia reduced. This was done with no tension. I then took some Ultrpore mesh and cut it to shape. It was anchored at the pubic tubercle and a running 2-0 Prolene used to suture it to the edge of the inferior shelving edge of the external oblique.  Several more sutures of 2-0 Prolene were used to anchor the mesh to the internal oblique.  This appeared to produce a nice coverage and repair with no tension. There was adequate space for the cord structures to exit through the mesh and deep ring.  I checked to make sure everything was dry. Additional local had been infiltrated as I was working to be sure we had complete anesthesia of the entire operative field.  The incision was then closed with a running 3-0 Vicryl on the external oblique, closing it over the repair. Scarpa's fascia was closed with a running 3-0 Vicryl and the skin with a running 4-0 Monocryl subcuticular and Dermabond on the skin.  The patient tolerated the procedure well. There were no operative complications. There was minimal blood loss. All counts were correct.  Currie Paris, MD, FACS 07/07/2012 2:49 PM

## 2012-07-07 NOTE — Anesthesia Preprocedure Evaluation (Signed)
Anesthesia Evaluation  Patient identified by MRN, date of birth, ID band Patient awake    Reviewed: Allergy & Precautions, H&P , NPO status , Patient's Chart, lab work & pertinent test results, reviewed documented beta blocker date and time   History of Anesthesia Complications Negative for: history of anesthetic complications  Airway Mallampati: I TM Distance: >3 FB Neck ROM: Full    Dental No notable dental hx. (+) Teeth Intact and Dental Advisory Given   Pulmonary former smoker,  breath sounds clear to auscultation  Pulmonary exam normal       Cardiovascular negative cardio ROS  Rhythm:Regular Rate:Normal     Neuro/Psych negative neurological ROS  negative psych ROS   GI/Hepatic negative GI ROS, Neg liver ROS,   Endo/Other  negative endocrine ROS  Renal/GU negative Renal ROS     Musculoskeletal  (+) Arthritis -, Osteoarthritis,    Abdominal   Peds  Hematology negative hematology ROS (+)   Anesthesia Other Findings   Reproductive/Obstetrics                           Anesthesia Physical Anesthesia Plan  ASA: II  Anesthesia Plan: MAC   Post-op Pain Management:    Induction: Intravenous  Airway Management Planned: Natural Airway and Simple Face Mask  Additional Equipment:   Intra-op Plan:   Post-operative Plan:   Informed Consent: I have reviewed the patients History and Physical, chart, labs and discussed the procedure including the risks, benefits and alternatives for the proposed anesthesia with the patient or authorized representative who has indicated his/her understanding and acceptance.   Dental advisory given  Plan Discussed with: CRNA and Surgeon  Anesthesia Plan Comments: (Plan routine monitors, MAC with local by Dr. Jamey Ripa)        Anesthesia Quick Evaluation

## 2012-07-07 NOTE — Anesthesia Procedure Notes (Addendum)
Procedure Name: MAC Date/Time: 07/07/2012 1:50 PM Performed by: Zenia Resides D Pre-anesthesia Checklist: Patient identified, Timeout performed, Emergency Drugs available, Suction available and Patient being monitored Patient Re-evaluated:Patient Re-evaluated prior to inductionOxygen Delivery Method: Simple face mask Ventilation: Oral airway inserted - appropriate to patient size Placement Confirmation: positive ETCO2 and CO2 detector

## 2012-07-07 NOTE — Transfer of Care (Signed)
Immediate Anesthesia Transfer of Care Note  Patient: Abigail Cohen  Procedure(s) Performed: Procedure(s) (LRB) with comments: HERNIA REPAIR INGUINAL ADULT (Left) - left inguinal area  Patient Location: PACU  Anesthesia Type:MAC  Level of Consciousness: awake and alert   Airway & Oxygen Therapy: Patient Spontanous Breathing and Patient connected to face mask oxygen  Post-op Assessment: Report given to PACU RN and Post -op Vital signs reviewed and stable  Post vital signs: Reviewed and stable  Complications: No apparent anesthesia complications

## 2012-07-07 NOTE — Anesthesia Postprocedure Evaluation (Signed)
  Anesthesia Post-op Note  Patient: Abigail Cohen  Procedure(s) Performed: Procedure(s) (LRB) with comments: HERNIA REPAIR INGUINAL ADULT (Left) - left inguinal area  Patient Location: PACU  Anesthesia Type:MAC  Level of Consciousness: awake, alert , oriented and patient cooperative  Airway and Oxygen Therapy: Patient Spontanous Breathing  Post-op Pain: none  Post-op Assessment: Post-op Vital signs reviewed, Patient's Cardiovascular Status Stable, Respiratory Function Stable, Patent Airway, No signs of Nausea or vomiting and Pain level controlled  Post-op Vital Signs: Reviewed and stable  Complications: No apparent anesthesia complications

## 2012-07-07 NOTE — Interval H&P Note (Signed)
History and Physical Interval Note:  07/07/2012 1:25 PM  Abigail Cohen  has presented today for surgery, with the diagnosis of left inguinal hernia  The various methods of treatment have been discussed with the patient and family. After consideration of risks, benefits and other options for treatment, the patient has consented to  Procedure(s) (LRB) with comments: HERNIA REPAIR INGUINAL ADULT (Left) as a surgical intervention .  The patient's history has been reviewed, patient examined, no change in status, stable for surgery.  I have reviewed the patient's chart and labs.  Questions were answered to the patient's satisfaction.    Left inguinal area is marked as the operative site [name]

## 2012-07-07 NOTE — H&P (View-Only) (Signed)
Abigail Cohen DOB: Apr 15, 1941 MRN: [id number]                                                                                      DATE: 06/20/2012  PCP: Claude Manges, FNP Referring Provider: Claude Manges, FNP  IMPRESSION:  Reducible likely direct left inguinal hernia  PLAN:   repair of left inguinal hernia. We can do this as an outpatient under general or IV sedation and local anesthesia. We'll set this up to be done at her convenience. I have discussed with the patient the fact that he has an inguinal hernia and reviewed the pathophysiology of that diagnosis. I have given him educational materials about this. I discussed options for treatment including observation or repair and have discussed both laparoscopic and open inguinal hernia repairs as potential techniques. We have discussed the use of mesh. We have discussed risks of surgery including bleeding, infection, recurrence, postoperative pain and possible chronic groin pain, testicular injury, and potential issues with voiding. I believe the patient's questions have been answered and that he has a good understanding of the issues                  CC:  Chief Complaint  Patient presents with  . Inguinal Hernia    Left    HPI:  Abigail Cohen is a 72 y.o.  female who presents for evaluation of A reducible left inguinal hernia. She pointed this out to her primary care physician on her recent exam. It is not that symptomatic and has been present for close to a year. She is not clear whether it is enlarged a little bit. Surgical evaluation was requested to evaluate for possible repair.  PMH:  has a past medical history of Osteopenia; Osteoarthritis; Borderline hypertension; and Uterine prolapse.  PSH:   has past surgical history that includes Replacement total knee bilateral (2003).  ALLERGIES:  No Known Allergies  MEDICATIONS: Current outpatient prescriptions:aspirin 81 MG tablet, Take 81 mg by mouth daily., Disp: , Rfl: ;   CALCIUM-VITAMIN D PO, Take 1,500 mg by mouth daily., Disp: , Rfl: ;  Docusate Calcium (STOOL SOFTENER PO), Take 100 mg by mouth daily., Disp: , Rfl: ;  estradiol (ESTRACE) 0.1 MG/GM vaginal cream, Place 2 g vaginally 2 (two) times a week., Disp: , Rfl: ;  Multiple Vitamin (MULTIVITAMIN) tablet, Take 1 tablet by mouth daily., Disp: , Rfl:  OVER THE COUNTER MEDICATION, citrical + vit D, Disp: , Rfl:   ROS: She has filled out our 12 point review of systems and it is negative . EXAM:   VITAL SIGNS: BP 124/72  Pulse 92  Temp 97.5 F (36.4 C) (Temporal)  Resp 16  Ht 4\' 10"  (1.473 m)  Wt 136 lb 12.8 oz (62.052 kg)  BMI 28.59 kg/m2 GENERAL:  The patient is alert, oriented, and generally healthy-appearing, NAD. Mood and affect are normal.  HEENT:  The head is normocephalic, the eyes nonicteric, the pupils were round regular and equal. EOMs are normal. Pharynx normal. Dentition good.  NECK:  The neck is supple and there are no masses or thyromegaly.  LUNGS: Normal respirations and clear to  auscultation.  HEART: Regular rhythm, with no murmurs rubs or gallops. Pulses are intact carotid dorsalis pedis and posterior tibial. No significant varicosities are noted.   ABDOMEN: Soft, flat, and nontender. No masses or organomegaly is noted. . Bowel sounds are normal.there is a reducible, moderately sized, left inguinal hernia, probably direct. Nontender. There is no right inguinal hernia and there is no umbilical hernia that I can appreciate today.  EXTREMITIES:  Good range of motion, mild bilateral lower extremity edema. Well-healed surgical scars from her knee replacements bilaterally   DATA REVIEWED:  I looked over the notes from her primary physician.    Abigail Cohen 06/20/2012  CC: Claude Manges, FNP, OWENS,REBECCA, FNP

## 2012-07-11 ENCOUNTER — Encounter (HOSPITAL_BASED_OUTPATIENT_CLINIC_OR_DEPARTMENT_OTHER): Payer: Self-pay | Admitting: Surgery

## 2012-07-25 ENCOUNTER — Encounter (INDEPENDENT_AMBULATORY_CARE_PROVIDER_SITE_OTHER): Payer: Self-pay | Admitting: Surgery

## 2012-07-25 ENCOUNTER — Ambulatory Visit (INDEPENDENT_AMBULATORY_CARE_PROVIDER_SITE_OTHER): Payer: Medicare Other | Admitting: Surgery

## 2012-07-25 VITALS — BP 112/68 | HR 84 | Temp 97.6°F | Resp 18 | Ht 59.0 in | Wt 136.8 lb

## 2012-07-25 DIAGNOSIS — K4090 Unilateral inguinal hernia, without obstruction or gangrene, not specified as recurrent: Secondary | ICD-10-CM

## 2012-07-25 NOTE — Patient Instructions (Signed)
We will see you again on an as needed basis. Please call the office at [phone] if you have any questions or concerns. Thank you for allowing us to take care of you.  

## 2012-07-25 NOTE — Progress Notes (Signed)
Abigail Cohen                                            DOB: May 15, 1941 DATE: 07/25/2012                                                  MRN: [id number]  CC: Post op   HPI: This patient comes in for post op follow-up .Sheunderwent [facility] repari on 07/06/12. She feels that she is doing well.  PE:  VITAL SIGNS: BP 112/68  Pulse 84  Temp 97.6 F (36.4 C) (Temporal)  Resp 18  Ht 4\' 11"  (1.499 m)  Wt 136 lb 12.8 oz (62.052 kg)  BMI 27.63 kg/m2  General: The patient appears to be healthy, NAD Incision: Heal;ing nicely, usual amount of ridging. repaior is solid  DATA REVIEWED: None  IMPRESSION: The patient is doing well S/P [facility] repari.    PLAN: RTC PRN

## 2012-07-31 DIAGNOSIS — N819 Female genital prolapse, unspecified: Secondary | ICD-10-CM | POA: Diagnosis not present

## 2012-09-05 DIAGNOSIS — Z4689 Encounter for fitting and adjustment of other specified devices: Secondary | ICD-10-CM | POA: Diagnosis not present

## 2012-09-05 DIAGNOSIS — N819 Female genital prolapse, unspecified: Secondary | ICD-10-CM | POA: Diagnosis not present

## 2012-12-06 DIAGNOSIS — N819 Female genital prolapse, unspecified: Secondary | ICD-10-CM | POA: Diagnosis not present

## 2012-12-06 DIAGNOSIS — Z4689 Encounter for fitting and adjustment of other specified devices: Secondary | ICD-10-CM | POA: Diagnosis not present

## 2013-01-08 ENCOUNTER — Other Ambulatory Visit: Payer: Self-pay

## 2013-01-08 DIAGNOSIS — Z1231 Encounter for screening mammogram for malignant neoplasm of breast: Secondary | ICD-10-CM

## 2013-02-09 ENCOUNTER — Ambulatory Visit
Admission: RE | Admit: 2013-02-09 | Discharge: 2013-02-09 | Disposition: A | Discharge status: Home / Self Care | Payer: Medicare Other | Source: Ambulatory Visit

## 2013-02-09 DIAGNOSIS — Z1231 Encounter for screening mammogram for malignant neoplasm of breast: Secondary | ICD-10-CM | POA: Diagnosis not present

## 2013-04-04 DIAGNOSIS — N819 Female genital prolapse, unspecified: Secondary | ICD-10-CM | POA: Diagnosis not present

## 2013-04-04 DIAGNOSIS — Z4689 Encounter for fitting and adjustment of other specified devices: Secondary | ICD-10-CM | POA: Diagnosis not present

## 2013-04-25 DIAGNOSIS — D696 Thrombocytopenia, unspecified: Secondary | ICD-10-CM | POA: Diagnosis not present

## 2013-04-25 DIAGNOSIS — Z0000 Encounter for general adult medical examination without abnormal findings: Secondary | ICD-10-CM | POA: Diagnosis not present

## 2013-04-25 DIAGNOSIS — Z13220 Encounter for screening for lipoid disorders: Secondary | ICD-10-CM | POA: Diagnosis not present

## 2013-04-25 DIAGNOSIS — Z23 Encounter for immunization: Secondary | ICD-10-CM | POA: Diagnosis not present

## 2013-04-26 DIAGNOSIS — Z0000 Encounter for general adult medical examination without abnormal findings: Secondary | ICD-10-CM | POA: Diagnosis not present

## 2013-04-26 DIAGNOSIS — D696 Thrombocytopenia, unspecified: Secondary | ICD-10-CM | POA: Diagnosis not present

## 2013-04-26 DIAGNOSIS — Z136 Encounter for screening for cardiovascular disorders: Secondary | ICD-10-CM | POA: Diagnosis not present

## 2013-09-05 DIAGNOSIS — Z4689 Encounter for fitting and adjustment of other specified devices: Secondary | ICD-10-CM | POA: Diagnosis not present

## 2013-10-17 DIAGNOSIS — J111 Influenza due to unidentified influenza virus with other respiratory manifestations: Secondary | ICD-10-CM | POA: Diagnosis not present

## 2013-10-17 DIAGNOSIS — R509 Fever, unspecified: Secondary | ICD-10-CM | POA: Diagnosis not present

## 2013-10-17 DIAGNOSIS — J029 Acute pharyngitis, unspecified: Secondary | ICD-10-CM | POA: Diagnosis not present

## 2014-01-07 ENCOUNTER — Other Ambulatory Visit: Payer: Self-pay

## 2014-01-07 DIAGNOSIS — Z1231 Encounter for screening mammogram for malignant neoplasm of breast: Secondary | ICD-10-CM

## 2014-02-11 ENCOUNTER — Ambulatory Visit: Payer: Medicare Other

## 2014-02-13 ENCOUNTER — Ambulatory Visit
Admission: RE | Admit: 2014-02-13 | Discharge: 2014-02-13 | Disposition: A | Discharge status: Home / Self Care | Payer: Medicare Other | Source: Ambulatory Visit

## 2014-02-13 DIAGNOSIS — Z1231 Encounter for screening mammogram for malignant neoplasm of breast: Secondary | ICD-10-CM

## 2014-03-19 DIAGNOSIS — N813 Complete uterovaginal prolapse: Secondary | ICD-10-CM | POA: Diagnosis not present

## 2014-03-19 DIAGNOSIS — Z4689 Encounter for fitting and adjustment of other specified devices: Secondary | ICD-10-CM | POA: Diagnosis not present

## 2014-04-26 DIAGNOSIS — M8580 Other specified disorders of bone density and structure, unspecified site: Secondary | ICD-10-CM | POA: Diagnosis not present

## 2014-04-26 DIAGNOSIS — Z0000 Encounter for general adult medical examination without abnormal findings: Secondary | ICD-10-CM | POA: Diagnosis not present

## 2014-04-26 DIAGNOSIS — Z131 Encounter for screening for diabetes mellitus: Secondary | ICD-10-CM | POA: Diagnosis not present

## 2014-04-29 ENCOUNTER — Other Ambulatory Visit: Payer: Self-pay | Admitting: Family

## 2014-04-29 DIAGNOSIS — M8580 Other specified disorders of bone density and structure, unspecified site: Secondary | ICD-10-CM

## 2014-05-09 ENCOUNTER — Ambulatory Visit
Admission: RE | Admit: 2014-05-09 | Discharge: 2014-05-09 | Disposition: A | Discharge status: Home / Self Care | Payer: Medicare Other | Source: Ambulatory Visit | Attending: Family | Admitting: Family

## 2014-05-09 DIAGNOSIS — M810 Age-related osteoporosis without current pathological fracture: Secondary | ICD-10-CM | POA: Diagnosis not present

## 2014-05-09 DIAGNOSIS — M8580 Other specified disorders of bone density and structure, unspecified site: Secondary | ICD-10-CM

## 2014-05-20 DIAGNOSIS — R600 Localized edema: Secondary | ICD-10-CM | POA: Diagnosis not present

## 2014-05-20 DIAGNOSIS — M810 Age-related osteoporosis without current pathological fracture: Secondary | ICD-10-CM | POA: Diagnosis not present

## 2014-05-20 DIAGNOSIS — H6121 Impacted cerumen, right ear: Secondary | ICD-10-CM | POA: Diagnosis not present

## 2014-06-03 DIAGNOSIS — Z4689 Encounter for fitting and adjustment of other specified devices: Secondary | ICD-10-CM | POA: Diagnosis not present

## 2014-06-03 DIAGNOSIS — Z01411 Encounter for gynecological examination (general) (routine) with abnormal findings: Secondary | ICD-10-CM | POA: Diagnosis not present

## 2014-06-03 DIAGNOSIS — M810 Age-related osteoporosis without current pathological fracture: Secondary | ICD-10-CM | POA: Diagnosis not present

## 2014-06-12 DIAGNOSIS — J019 Acute sinusitis, unspecified: Secondary | ICD-10-CM | POA: Diagnosis not present

## 2014-07-06 DIAGNOSIS — Z23 Encounter for immunization: Secondary | ICD-10-CM | POA: Diagnosis not present

## 2014-08-03 DIAGNOSIS — H10021 Other mucopurulent conjunctivitis, right eye: Secondary | ICD-10-CM | POA: Diagnosis not present

## 2014-08-05 DIAGNOSIS — J019 Acute sinusitis, unspecified: Secondary | ICD-10-CM | POA: Diagnosis not present

## 2014-08-21 DIAGNOSIS — H25012 Cortical age-related cataract, left eye: Secondary | ICD-10-CM | POA: Diagnosis not present

## 2014-08-21 DIAGNOSIS — H2511 Age-related nuclear cataract, right eye: Secondary | ICD-10-CM | POA: Diagnosis not present

## 2014-08-21 DIAGNOSIS — H2512 Age-related nuclear cataract, left eye: Secondary | ICD-10-CM | POA: Diagnosis not present

## 2014-08-21 DIAGNOSIS — H25041 Posterior subcapsular polar age-related cataract, right eye: Secondary | ICD-10-CM | POA: Diagnosis not present

## 2014-08-21 DIAGNOSIS — H25011 Cortical age-related cataract, right eye: Secondary | ICD-10-CM | POA: Diagnosis not present

## 2014-08-21 DIAGNOSIS — H25042 Posterior subcapsular polar age-related cataract, left eye: Secondary | ICD-10-CM | POA: Diagnosis not present

## 2014-08-30 DIAGNOSIS — N816 Rectocele: Secondary | ICD-10-CM | POA: Diagnosis not present

## 2014-08-30 DIAGNOSIS — N819 Female genital prolapse, unspecified: Secondary | ICD-10-CM | POA: Diagnosis not present

## 2014-08-30 DIAGNOSIS — Z4689 Encounter for fitting and adjustment of other specified devices: Secondary | ICD-10-CM | POA: Diagnosis not present

## 2014-10-08 DIAGNOSIS — H25012 Cortical age-related cataract, left eye: Secondary | ICD-10-CM | POA: Diagnosis not present

## 2014-10-08 DIAGNOSIS — H2512 Age-related nuclear cataract, left eye: Secondary | ICD-10-CM | POA: Diagnosis not present

## 2014-10-09 DIAGNOSIS — H25041 Posterior subcapsular polar age-related cataract, right eye: Secondary | ICD-10-CM | POA: Diagnosis not present

## 2014-10-09 DIAGNOSIS — H2511 Age-related nuclear cataract, right eye: Secondary | ICD-10-CM | POA: Diagnosis not present

## 2014-10-09 DIAGNOSIS — H25011 Cortical age-related cataract, right eye: Secondary | ICD-10-CM | POA: Diagnosis not present

## 2014-10-15 DIAGNOSIS — H2511 Age-related nuclear cataract, right eye: Secondary | ICD-10-CM | POA: Diagnosis not present

## 2014-10-15 DIAGNOSIS — H25011 Cortical age-related cataract, right eye: Secondary | ICD-10-CM | POA: Diagnosis not present

## 2015-01-14 ENCOUNTER — Other Ambulatory Visit: Payer: Self-pay

## 2015-01-14 DIAGNOSIS — Z1231 Encounter for screening mammogram for malignant neoplasm of breast: Secondary | ICD-10-CM

## 2015-02-18 ENCOUNTER — Ambulatory Visit
Admission: RE | Admit: 2015-02-18 | Discharge: 2015-02-18 | Disposition: A | Discharge status: Home / Self Care | Payer: Medicare Other | Source: Ambulatory Visit

## 2015-02-18 DIAGNOSIS — Z1231 Encounter for screening mammogram for malignant neoplasm of breast: Secondary | ICD-10-CM

## 2015-02-28 DIAGNOSIS — N813 Complete uterovaginal prolapse: Secondary | ICD-10-CM | POA: Diagnosis not present

## 2015-02-28 DIAGNOSIS — Z4689 Encounter for fitting and adjustment of other specified devices: Secondary | ICD-10-CM | POA: Diagnosis not present

## 2015-04-22 DIAGNOSIS — Z23 Encounter for immunization: Secondary | ICD-10-CM | POA: Diagnosis not present

## 2015-04-22 DIAGNOSIS — Z79899 Other long term (current) drug therapy: Secondary | ICD-10-CM | POA: Diagnosis not present

## 2015-04-22 DIAGNOSIS — M810 Age-related osteoporosis without current pathological fracture: Secondary | ICD-10-CM | POA: Diagnosis not present

## 2015-04-22 DIAGNOSIS — R202 Paresthesia of skin: Secondary | ICD-10-CM | POA: Diagnosis not present

## 2015-04-28 DIAGNOSIS — Z0000 Encounter for general adult medical examination without abnormal findings: Secondary | ICD-10-CM | POA: Diagnosis not present

## 2015-05-08 ENCOUNTER — Ambulatory Visit (INDEPENDENT_AMBULATORY_CARE_PROVIDER_SITE_OTHER): Payer: Medicare Other

## 2015-05-08 ENCOUNTER — Encounter: Payer: Self-pay | Admitting: Podiatry

## 2015-05-08 ENCOUNTER — Ambulatory Visit (INDEPENDENT_AMBULATORY_CARE_PROVIDER_SITE_OTHER): Payer: Medicare Other | Admitting: Podiatry

## 2015-05-08 VITALS — BP 129/84 | HR 96 | Resp 16

## 2015-05-08 DIAGNOSIS — G629 Polyneuropathy, unspecified: Secondary | ICD-10-CM | POA: Diagnosis not present

## 2015-05-08 DIAGNOSIS — M21619 Bunion of unspecified foot: Secondary | ICD-10-CM | POA: Diagnosis not present

## 2015-05-08 DIAGNOSIS — M2040 Other hammer toe(s) (acquired), unspecified foot: Secondary | ICD-10-CM | POA: Diagnosis not present

## 2015-05-08 DIAGNOSIS — M2011 Hallux valgus (acquired), right foot: Secondary | ICD-10-CM | POA: Diagnosis not present

## 2015-05-08 NOTE — Progress Notes (Signed)
   Subjective:    Patient ID: Abigail Cohen, female    DOB: 11-21-1940, 74 y.o.   MRN: [id number]  HPI she presents today for a chief complain of some pins and needles type sensations and plantar aspect of the forefoot bilaterally. She states they've been present for 2 years and evaluated by her primary care provider. She states that she had her nerve checked out by her primary care provider and they seemed to be okay. She states she also has some deformities present but is not interested in correction.    Review of Systems  Musculoskeletal: Positive for arthralgias and gait problem.  Neurological: Positive for numbness.  All other systems reviewed and are negative.      Objective:   Physical Exam:: 74 year old female in no apparent distress vital signs stable alert and oriented 3 pulses are strong and palpable. Neurologic sensorium appears to be intact per Semmes-Weinstein monofilament. Deep tendon reflexes are intact bilateral muscle strength is normal bilateral. Orthopedic evaluation doesn't straight hallux valgus deformities bilateral hammertoe deformities bilateral. Cutaneous evaluation of straight supple well-hydrated cutis no erythema edema cellulitis drainage or odor        Assessment & Plan:  Very early idiopathic neuropathy.  Plan: At this point I recommended that she consider Nu Remedy, and over-the-counter vitamin. I will follow-up with her in [place].  Roselind Messier DPM

## 2015-05-08 NOTE — Patient Instructions (Signed)

## 2015-05-10 ENCOUNTER — Other Ambulatory Visit: Payer: Self-pay | Admitting: Family

## 2015-05-10 DIAGNOSIS — R5381 Other malaise: Secondary | ICD-10-CM

## 2015-06-03 DIAGNOSIS — Z4689 Encounter for fitting and adjustment of other specified devices: Secondary | ICD-10-CM | POA: Diagnosis not present

## 2015-08-07 ENCOUNTER — Ambulatory Visit: Payer: Medicare Other | Admitting: Podiatry

## 2015-08-12 ENCOUNTER — Encounter: Payer: Self-pay | Admitting: Podiatry

## 2015-08-12 ENCOUNTER — Ambulatory Visit (INDEPENDENT_AMBULATORY_CARE_PROVIDER_SITE_OTHER): Payer: Medicare Other | Admitting: Podiatry

## 2015-08-12 VITALS — BP 100/66 | HR 71 | Resp 16

## 2015-08-12 DIAGNOSIS — G629 Polyneuropathy, unspecified: Secondary | ICD-10-CM | POA: Diagnosis not present

## 2015-08-12 DIAGNOSIS — Z79899 Other long term (current) drug therapy: Secondary | ICD-10-CM

## 2015-08-12 NOTE — Progress Notes (Signed)
She presents today for follow-up of her idiopathic neuropathy. She states that it seems that she has less burning and tingling in her feet in the morning and at bedtime and she really doesn't notice it in the daytime at all anymore. She continues to take her medication.    objective: vital signs are stable alert and oriented  No changes in physical findings.  Assessment: idiopathic neuropathy bilateral foot.  Plan: continue the use of NuRemedy

## 2015-10-14 DIAGNOSIS — G629 Polyneuropathy, unspecified: Secondary | ICD-10-CM

## 2015-10-21 DIAGNOSIS — M79605 Pain in left leg: Secondary | ICD-10-CM | POA: Diagnosis not present

## 2015-10-21 DIAGNOSIS — M79652 Pain in left thigh: Secondary | ICD-10-CM | POA: Diagnosis not present

## 2015-10-21 DIAGNOSIS — R109 Unspecified abdominal pain: Secondary | ICD-10-CM | POA: Diagnosis not present

## 2015-11-17 DIAGNOSIS — H26491 Other secondary cataract, right eye: Secondary | ICD-10-CM | POA: Diagnosis not present

## 2015-11-17 DIAGNOSIS — Z961 Presence of intraocular lens: Secondary | ICD-10-CM | POA: Diagnosis not present

## 2015-11-17 DIAGNOSIS — H02839 Dermatochalasis of unspecified eye, unspecified eyelid: Secondary | ICD-10-CM | POA: Diagnosis not present

## 2015-11-17 DIAGNOSIS — H18411 Arcus senilis, right eye: Secondary | ICD-10-CM | POA: Diagnosis not present

## 2015-11-24 DIAGNOSIS — Z4689 Encounter for fitting and adjustment of other specified devices: Secondary | ICD-10-CM | POA: Diagnosis not present

## 2015-11-24 DIAGNOSIS — M859 Disorder of bone density and structure, unspecified: Secondary | ICD-10-CM | POA: Diagnosis not present

## 2015-11-24 DIAGNOSIS — Z01411 Encounter for gynecological examination (general) (routine) with abnormal findings: Secondary | ICD-10-CM | POA: Diagnosis not present

## 2015-11-24 DIAGNOSIS — M8589 Other specified disorders of bone density and structure, multiple sites: Secondary | ICD-10-CM | POA: Diagnosis not present

## 2015-12-04 DIAGNOSIS — M79605 Pain in left leg: Secondary | ICD-10-CM | POA: Diagnosis not present

## 2015-12-08 DIAGNOSIS — H26492 Other secondary cataract, left eye: Secondary | ICD-10-CM | POA: Diagnosis not present

## 2015-12-08 DIAGNOSIS — Z961 Presence of intraocular lens: Secondary | ICD-10-CM | POA: Diagnosis not present

## 2015-12-09 DIAGNOSIS — M7062 Trochanteric bursitis, left hip: Secondary | ICD-10-CM | POA: Diagnosis not present

## 2015-12-09 DIAGNOSIS — H26492 Other secondary cataract, left eye: Secondary | ICD-10-CM | POA: Diagnosis not present

## 2015-12-09 DIAGNOSIS — M25562 Pain in left knee: Secondary | ICD-10-CM | POA: Diagnosis not present

## 2015-12-09 DIAGNOSIS — M25561 Pain in right knee: Secondary | ICD-10-CM | POA: Diagnosis not present

## 2016-02-02 ENCOUNTER — Other Ambulatory Visit: Payer: Self-pay | Admitting: Family

## 2016-02-02 DIAGNOSIS — Z1231 Encounter for screening mammogram for malignant neoplasm of breast: Secondary | ICD-10-CM

## 2016-02-10 ENCOUNTER — Ambulatory Visit (INDEPENDENT_AMBULATORY_CARE_PROVIDER_SITE_OTHER): Payer: Medicare Other | Admitting: Podiatry

## 2016-02-10 ENCOUNTER — Encounter: Payer: Self-pay | Admitting: Podiatry

## 2016-02-10 DIAGNOSIS — G629 Polyneuropathy, unspecified: Secondary | ICD-10-CM

## 2016-02-10 NOTE — Progress Notes (Signed)
She presents today for follow-up of her neuropathy. She states that she's been diagnosed with scoliosis and leg length discrepancy and some sciatica to her left side. She states that her neuropathy is worse on the left. She states that she's been taking the supplements for neuropathy provided to her from our office if she's not sure that she is doing better or not. He states that it does not hurt is often and is frequently as it once did this still seems to hurt some on occasions.  Objective: Vital signs are stable she is alert and oriented 3. No changes on physical exam.  Assessment: Significant neuropathy bilateral foot.  Plan: He continue the use of the supplements and I will follow-up with her in 6 months.

## 2016-02-20 ENCOUNTER — Ambulatory Visit: Payer: Medicare Other

## 2016-02-20 ENCOUNTER — Ambulatory Visit
Admission: RE | Admit: 2016-02-20 | Discharge: 2016-02-20 | Disposition: A | Discharge status: Home / Self Care | Payer: Medicare Other | Source: Ambulatory Visit | Attending: Family | Admitting: Family

## 2016-02-20 DIAGNOSIS — Z1231 Encounter for screening mammogram for malignant neoplasm of breast: Secondary | ICD-10-CM

## 2016-04-28 DIAGNOSIS — G6289 Other specified polyneuropathies: Secondary | ICD-10-CM | POA: Diagnosis not present

## 2016-04-28 DIAGNOSIS — G603 Idiopathic progressive neuropathy: Secondary | ICD-10-CM | POA: Diagnosis not present

## 2016-04-28 DIAGNOSIS — Z0000 Encounter for general adult medical examination without abnormal findings: Secondary | ICD-10-CM | POA: Diagnosis not present

## 2016-04-28 DIAGNOSIS — Z23 Encounter for immunization: Secondary | ICD-10-CM | POA: Diagnosis not present

## 2016-05-04 DIAGNOSIS — M542 Cervicalgia: Secondary | ICD-10-CM | POA: Diagnosis not present

## 2016-05-10 DIAGNOSIS — M8589 Other specified disorders of bone density and structure, multiple sites: Secondary | ICD-10-CM | POA: Diagnosis not present

## 2016-05-10 DIAGNOSIS — M810 Age-related osteoporosis without current pathological fracture: Secondary | ICD-10-CM | POA: Diagnosis not present

## 2016-05-10 DIAGNOSIS — Z4689 Encounter for fitting and adjustment of other specified devices: Secondary | ICD-10-CM | POA: Diagnosis not present

## 2016-05-11 DIAGNOSIS — M542 Cervicalgia: Secondary | ICD-10-CM | POA: Diagnosis not present

## 2016-05-11 DIAGNOSIS — M6281 Muscle weakness (generalized): Secondary | ICD-10-CM | POA: Diagnosis not present

## 2016-05-18 DIAGNOSIS — M542 Cervicalgia: Secondary | ICD-10-CM | POA: Diagnosis not present

## 2016-05-18 DIAGNOSIS — M6281 Muscle weakness (generalized): Secondary | ICD-10-CM | POA: Diagnosis not present

## 2016-05-21 DIAGNOSIS — M542 Cervicalgia: Secondary | ICD-10-CM | POA: Diagnosis not present

## 2016-05-21 DIAGNOSIS — M6281 Muscle weakness (generalized): Secondary | ICD-10-CM | POA: Diagnosis not present

## 2016-05-24 DIAGNOSIS — M542 Cervicalgia: Secondary | ICD-10-CM | POA: Diagnosis not present

## 2016-05-24 DIAGNOSIS — M6281 Muscle weakness (generalized): Secondary | ICD-10-CM | POA: Diagnosis not present

## 2016-05-26 DIAGNOSIS — M542 Cervicalgia: Secondary | ICD-10-CM | POA: Diagnosis not present

## 2016-05-26 DIAGNOSIS — M6281 Muscle weakness (generalized): Secondary | ICD-10-CM | POA: Diagnosis not present

## 2016-06-01 DIAGNOSIS — M6281 Muscle weakness (generalized): Secondary | ICD-10-CM | POA: Diagnosis not present

## 2016-06-01 DIAGNOSIS — M542 Cervicalgia: Secondary | ICD-10-CM | POA: Diagnosis not present

## 2016-06-03 DIAGNOSIS — M6281 Muscle weakness (generalized): Secondary | ICD-10-CM | POA: Diagnosis not present

## 2016-06-03 DIAGNOSIS — M542 Cervicalgia: Secondary | ICD-10-CM | POA: Diagnosis not present

## 2016-06-09 DIAGNOSIS — M6281 Muscle weakness (generalized): Secondary | ICD-10-CM | POA: Diagnosis not present

## 2016-06-09 DIAGNOSIS — M542 Cervicalgia: Secondary | ICD-10-CM | POA: Diagnosis not present

## 2016-06-11 DIAGNOSIS — M6281 Muscle weakness (generalized): Secondary | ICD-10-CM | POA: Diagnosis not present

## 2016-06-11 DIAGNOSIS — M542 Cervicalgia: Secondary | ICD-10-CM | POA: Diagnosis not present

## 2016-06-15 DIAGNOSIS — M6281 Muscle weakness (generalized): Secondary | ICD-10-CM | POA: Diagnosis not present

## 2016-06-15 DIAGNOSIS — M542 Cervicalgia: Secondary | ICD-10-CM | POA: Diagnosis not present

## 2016-06-17 DIAGNOSIS — M6281 Muscle weakness (generalized): Secondary | ICD-10-CM | POA: Diagnosis not present

## 2016-06-17 DIAGNOSIS — M542 Cervicalgia: Secondary | ICD-10-CM | POA: Diagnosis not present

## 2016-06-18 DIAGNOSIS — M810 Age-related osteoporosis without current pathological fracture: Secondary | ICD-10-CM | POA: Diagnosis not present

## 2016-06-21 DIAGNOSIS — M542 Cervicalgia: Secondary | ICD-10-CM | POA: Diagnosis not present

## 2016-06-21 DIAGNOSIS — M6281 Muscle weakness (generalized): Secondary | ICD-10-CM | POA: Diagnosis not present

## 2016-07-07 DIAGNOSIS — M6281 Muscle weakness (generalized): Secondary | ICD-10-CM | POA: Diagnosis not present

## 2016-07-07 DIAGNOSIS — M542 Cervicalgia: Secondary | ICD-10-CM | POA: Diagnosis not present

## 2016-07-09 DIAGNOSIS — M6281 Muscle weakness (generalized): Secondary | ICD-10-CM | POA: Diagnosis not present

## 2016-07-09 DIAGNOSIS — M542 Cervicalgia: Secondary | ICD-10-CM | POA: Diagnosis not present

## 2016-07-12 DIAGNOSIS — M810 Age-related osteoporosis without current pathological fracture: Secondary | ICD-10-CM | POA: Diagnosis not present

## 2016-07-15 DIAGNOSIS — M542 Cervicalgia: Secondary | ICD-10-CM | POA: Diagnosis not present

## 2016-07-15 DIAGNOSIS — M6281 Muscle weakness (generalized): Secondary | ICD-10-CM | POA: Diagnosis not present

## 2016-07-29 DIAGNOSIS — M6281 Muscle weakness (generalized): Secondary | ICD-10-CM | POA: Diagnosis not present

## 2016-07-29 DIAGNOSIS — R2689 Other abnormalities of gait and mobility: Secondary | ICD-10-CM | POA: Diagnosis not present

## 2016-08-02 ENCOUNTER — Ambulatory Visit (INDEPENDENT_AMBULATORY_CARE_PROVIDER_SITE_OTHER): Payer: Medicare Other | Admitting: Internal Medicine

## 2016-08-02 ENCOUNTER — Encounter: Payer: Self-pay | Admitting: Internal Medicine

## 2016-08-02 DIAGNOSIS — M810 Age-related osteoporosis without current pathological fracture: Secondary | ICD-10-CM

## 2016-08-02 NOTE — Progress Notes (Signed)
Patient ID: Abigail Cohen, female   DOB: 1940-11-07, 76 y.o.   MRN: [id number]    HPI  Abigail Cohen is a 76 y.o.-year-old female, referred by her PCP, Dr.Jennifer Owens Shark, for management of osteoporosis.  Pt was dx with OP in 2015.  I reviewed pt's DEXA scans: Date L1-L4 T score FN T score 33% distal Radius UD radius  05/10/2016 (Lunar) n/a RFN: -2.0 LFN: -1.5 -3.1 -2.8  05/09/2014 (Hologic) n/a RFN: n/a LFN: -1.7 -2.6 -1.3  04/05/2012 (Lunar) n/a RFN: -1.7 -2.2   11/02/2006 (Lunar) n/a RFN: -1.6 -2.1   10/04/2001 (Hologic)  -0.5 LFN: -0.8 n/a    She denies fractures or falls.  No dizziness/vertigo/orthostasis/poor vision.  Previous OP treatments:  - Fosamax 2015-06/2016  No vitamin D deficiency. Reviewed available vit D levels: 11/24/2015: vit D 49.2 06/03/2014: Vit D 39.7 No results found for: VD25OH  Pt is on vitamin D 250 units daily. Also calcium citrate 250 units in am with a MVI. She also eats dairy and green, leafy, vegetables.  She writes down each day how much calcium she takes.   Will start weight bearing exercises. She starts going to [facility] on [address].   She does not take high vitamin A doses.  Menopause was in her 9s.   Pt does not have a FH of osteoporosis.  No h/o hyper/hypocalcemia or hyperparathyroidism. No h/o kidney stones. 04/28/2016: Calcium 9.8 No results found for: PTH, CALCIUM  No h/o thyrotoxicosis. 05/20/2014: Total T4 7.0 (4.5-12) No results found for: TSH  No h/o CKD. Last BUN/Cr: 06/18/2016: EGFR 78, BUN/creatinine 29/0.72  04/28/2016: EGFR 85: BUN/creatinine 24/0.67 No results found for: BUN, CREATININE   She has a h/o 1 steroid inj. for hip bursitis.  ROS: Constitutional: no weight gain/loss, no fatigue, no subjective hyperthermia/hypothermia Eyes: no blurry vision, no xerophthalmia ENT: no sore throat, no nodules palpated in throat, no dysphagia/odynophagia, no hoarseness, + hypoacusis, +  tinnitus Cardiovascular: no CP/SOB/palpitations/+ occasional leg swelling Respiratory: no cough/SOB Gastrointestinal: no N/V/D/C/+ occasional heartburn Musculoskeletal: + both: muscle/joint aches Skin: no rashes, + hair loss Neurological: no tremors/+ numbness/+ tingling - feet/dizziness Psychiatric: no depression/anxiety  Past Medical History:  Diagnosis Date  . Borderline hypertension   . Family history of anesthesia complication    daughter gets nauseated  . Knee joint replacement status   . Osteoarthritis   . Osteopenia   . Uterine prolapse   . Wears glasses   . Wears partial dentures    upper and lower partial   Past Surgical History:  Procedure Laterality Date  . COLONOSCOPY    . HERNIA REPAIR  07/07/12   Dr Margot Chimes  . INGUINAL HERNIA REPAIR  07/07/2012   Procedure: HERNIA REPAIR INGUINAL ADULT;  Surgeon: Haywood Lasso, MD;  Location: [facility] [place];  Service: General;  Laterality: Left;  left inguinal area  . REPLACEMENT TOTAL KNEE BILATERAL  2003  . TONSILLECTOMY  1945  . WISDOM TOOTH EXTRACTION     Social History   Social History  . Marital status: Divorced    Spouse name: N/A  . Number of children: N/A  . Years of education: N/A   Occupational History  . Not on file.   Social History Main Topics  . Smoking status: Former Smoker    Start date: 07/05/2004    Quit date: 06/30/2005  . Smokeless tobacco: Never Used  . Alcohol use Yes     Comment: social  . Drug use: No  Current Outpatient Prescriptions  Medication Sig Dispense Refill  . aspirin 81 MG tablet Take 81 mg by mouth daily.    Marland Kitchen b complex vitamins tablet Take 2 tablets by mouth daily.    Marland Kitchen CALCIUM-VITAMIN D PO Take 1,500 mg by mouth daily.    Mariane Baumgarten Calcium (STOOL SOFTENER PO) Take 100 mg by mouth daily.    Marland Kitchen estradiol (ESTRACE) 0.1 MG/GM vaginal cream Place 2 g vaginally 2 (two) times a week.    . Multiple Vitamin (MULTIVITAMIN) tablet Take 1 tablet by mouth daily.     No  current facility-administered medications for this visit.    No Known Allergies Family History  Problem Relation Age of Onset  . Heart disease Mother   . Heart disease Father   . Cancer Sister     endometrial   PE: BP 128/88 (BP Location: Left Arm)   Pulse 88   Ht 4' 9.5" (1.461 m)   Wt 137 lb (62.1 kg)   SpO2 98%   BMI 29.13 kg/m  Wt Readings from Last 3 Encounters:  08/02/16 137 lb (62.1 kg)  07/25/12 136 lb 12.8 oz (62.1 kg)  07/07/12 135 lb (61.2 kg)   Constitutional: overweight, in NAD. No kyphosis. 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 Osteoporosis  Plan: 1. Osteoporosis - likely postmenopausal - Discussed about increased risk of fracture, depending on the T score, greatly increased when the T score is lower than -2.5, but it is actually a continuum and -2.5 should not be regarded as an absolute threshold. We reviewed her last 2 DEXA scan reports together. I did explain the purpose of checking the radius BMD and the difference between ultra distal and 33% distal radius. I also explained that since the studies were checked on different machines, they are not directly comparable. However, I explained that based on the T scores, she has an increased risk for fractures. - we reviewed her dietary and supplemental calcium and vitamin D intake. She does get  1000-1200 mg of calcium daily and I will check vit D today to see if she needs supplementation - given her specific instructions about food sources for Calcium and Vitamin D - see pt instructions  - discussed fall precautions   - given handout from [facility]: weight bearing exercises - advised to do this every day or at least 5/7 days. She will also start an organized exercise pgm. - we discussed about maintaining a good amount of protein in her diet. The recommended daily protein intake is ~0.8 g per kilogram per day (for her ~50-60 g) . I advised her to try to aim for this amount (use mostly plant proteins), since a diet low in proteins can exacerbate osteoporosis. Also, avoid smoking or >2 drinks of alcohol a day. - We discussed about the different medication classes, benefits and side effects (including atypical fractures and ONJ - no dental workup in progress or planned).  - I explained that, since she had no clear benefit from oral bisphosphonates (Fosamax), so my first choice would be sq denosumab (Prolia) for 3-6 years, then zoledronic acid (iv Reclast) for 1-2 years. I explained that we cannot stop Prolia abruptly since  she would lose all the BMD benefits in that case. I would use Teriparatide as a last resort. Pt was given reading information about Prolia, and I explained the mechanism of action and expected benefits. She does agree with Prolia. - if labs normal, will arrange for a Prolia inj - will check a new DEXA scan in 2 years after starting Prolia -  I explained that the first indication that the treatment is working is her not having anymore fractures. DEXA scan changes are secondary: unchanged or slightly higher T-scores are desirable - will see pt back in a year  We will check the following labs in 3 days, as now she is on biotin and this can influence 2 of the labs: Orders Placed This Encounter  Procedures  . Vitamin D, 25-hydroxy  . BASIC METABOLIC PANEL WITH GFR  . Parathyroid hormone, intact (no Ca)  . TSH   Component     Latest Ref Rng & Units 08/04/2016  Sodium     135 - 146 mmol/L 139  Potassium     3.5 - 5.3 mmol/L 4.8  Chloride     98 - 110 mmol/L 103  CO2     20 - 31 mmol/L 28  Glucose     65 - 99  mg/dL 73  BUN     7 - 25 mg/dL 23  Creatinine     0.60 - 0.93 mg/dL 0.70  Calcium     8.6 - 10.4 mg/dL 9.1  GFR, Est African American     >=60 mL/min >89  GFR, Est Non African American     >=60 mL/min 83  VITD     30.00 - 100.00 ng/mL 41.40  PTH     15 - 65 pg/mL 29  TSH     0.35 - 4.50 uIU/mL 0.54   All labs are normal. We can continue with Prolia. She was also asking me about her neuropathy, and I suggested to start alpha lipoic acid. Also, resume the B complex.  - time spent with the patient: 1 hour, of which >50% was spent in obtaining information about her symptoms, reviewing her previous labs, evaluations, and treatments, counseling her about her condition (please see the discussed topics above), and developing a plan to further investigate it; she had a number of questions which I addressed.  CC: Dr. Tomasita Crumble, MD PhD [facility]

## 2016-08-02 NOTE — Patient Instructions (Addendum)
Please stop the B complex and come back for labs in 2-3 days.  We will start the PA for Prolia.  Please return in 1 year.  How Can I Prevent Falls? Men and women with osteoporosis need to take care not to fall down. Falls can break bones. Some reasons people fall are: Poor vision  Poor balance  Certain diseases that affect how you walk  Some types of medicine, such as sleeping pills.  Some tips to help prevent falls outdoors are: Use a cane or walker  Wear rubber-soled shoes so you don't slip  Walk on grass when sidewalks are slippery  In winter, put salt or kitty litter on icy sidewalks.  Some ways to help prevent falls indoors are: Keep rooms free of clutter, especially on floors  Use plastic or carpet runners on slippery floors  Wear low-heeled shoes that provide good support  Do not walk in socks, stockings, or slippers  Be sure carpets and area rugs have skid-proof backs or are tacked to the floor  Be sure stairs are well lit and have rails on both sides  Put grab bars on bathroom walls near tub, shower, and toilet  Use a rubber bath mat in the shower or tub  Keep a flashlight next to your bed  Use a sturdy step stool with a handrail and wide steps  Add more lights in rooms (and night lights) Buy a cordless phone to keep with you so that you don't have to rush to the phone       when it rings and so that you can call for help if you fall.   (adapted from http://www.niams.[employer].at)  Dietary sources of calcium and vitamin D:  Calcium content (mg) - http://www.niams.https://www.gonzalez.org/  Fortified oatmeal, 1 packet 350  Sardines, canned in oil, with edible bones, 3 oz. 324  Cheddar cheese, 1 oz. shredded 306  Milk, nonfat, 1 cup 302  Milkshake, 1 cup 300  Yogurt, plain, low-fat, 1 cup 300  Soybeans, cooked, 1 cup 261  Tofu, firm, with calcium,  cup 204  Orange juice, fortified with calcium, 6 oz. 200-260  (varies)  Salmon, canned, with edible bones, 3 oz. 181  Pudding, instant, made with 2% milk,  cup 153  Baked beans, 1 cup 142  Cottage cheese, 1% milk fat, 1 cup 138  Spaghetti, lasagna, 1 cup 125  Frozen yogurt, vanilla, soft-serve,  cup 103  Ready-to-eat cereal, fortified with calcium, 1 cup 100-1,000 (varies)  Cheese pizza, 1 slice 100  Fortified waffles, 2 100  Turnip greens, boiled,  cup 99  Broccoli, raw, 1 cup 90  Ice cream, vanilla,  cup 85  Soy or rice milk, fortified with calcium, 1 cup 80-500 (varies)   Vitamin D content (International Units, IU) - https://www.ars.usda.gov Cod liver oil, 1 tablespoon 1,360  Swordfish, cooked, 3 oz 566  Salmon (sockeye), cooked, 3 oz 447  Tuna fish, canned in water, drained, 3 oz 154  Orange juice fortified with vitamin D, 1 cup (check product labels, as amount of added vitamin D varies) 137  Milk, nonfat, reduced fat, and whole, vitamin D-fortified, 1 cup 115-124  Yogurt, fortified with 20% of the daily value for vitamin D, 6 oz 80  Margarine, fortified, 1 tablespoon 60  Sardines, canned in oil, drained, 2 sardines 46  Liver, beef, cooked, 3 oz 42  Egg, 1 large (vitamin D is found in yolk) 41  Ready-to-eat cereal, fortified with 10% of the daily value for vitamin D, 0.75-1  cup  40  Cheese, Swiss, 1 oz 6    Exercise for Strong Bones (from [employer]) There are two types of exercises that are important for building and maintaining bone density:  weight-bearing and muscle-strengthening exercises. Weight-bearing Exercises These exercises include activities that make you move against gravity while staying upright. Weight-bearing exercises can be high-impact or low-impact. High-impact weight-bearing exercises help build bones and keep them strong. If you have broken a bone due to osteoporosis or are at risk of breaking a bone, you may need to avoid high-impact exercises. If you're not sure, you should check with your  healthcare provider. Examples of high-impact weight-bearing exercises are: . Dancing . Doing high-impact aerobics . Hiking . Jogging/running . Jumping Rope . Stair climbing . Tennis Low-impact weight-bearing exercises can also help keep bones strong and are a safe alternative if you cannot do high-impact exercises. Examples of low-impact weight-bearing exercises are: . Using elliptical training machines . Doing low-impact aerobics . Using stair-step machines . Fast walking on a treadmill or outside Muscle-Strengthening Exercises These exercises include activities where you move your body, a weight or some other resistance against gravity. They are also known as resistance exercises and include: . Lifting weights . Using elastic exercise bands . Using weight machines . Lifting your own body weight . Functional movements, such as standing and rising up on your toes Yoga and Pilates can also improve strength, balance and flexibility. However, certain positions may not be safe for people with osteoporosis or those at increased risk of broken bones. For example, exercises that have you bend forward may increase the chance of breaking a bone in the spine. A physical therapist should be able to help you learn which exercises are safe and appropriate for you. Non-Impact Exercises Non-impact exercises can help you to improve balance, posture and how well you move in everyday activities. These exercises can also help to increase muscle strength and decrease the risk of falls and broken bones. Some of these exercises include: . Balance exercises that strengthen your legs and test your balance, such as Tai Chi, can decrease your risk of falls. . Posture exercises that improve your posture and reduce rounded or "sloping" shoulders can help you decrease the chance of breaking a bone, especially in the spine. . Functional exercises that improve how well you move can help you with everyday activities and  decrease your chance of falling and breaking a bone. For example, if you have trouble getting up from a chair or climbing stairs, you should do these activities as exercises. A physical therapist can teach you balance, posture and functional exercises. Starting a New Exercise Program If you haven't exercised regularly for a while, check with your healthcare provider before beginning a new exercise program--particularly if you have health problems such as heart disease, diabetes or high blood pressure. If you're at high risk of breaking a bone, you should work with a physical therapist to develop a safe exercise program. Once you have your healthcare provider's approval, start slowly. If you've already broken bones in the spine because of osteoporosis, be very careful to avoid activities that require reaching down, bending forward, rapid twisting motions, heavy lifting and those that increase your chance of a fall. As you get started, your muscles may feel sore for a day or two after you exercise. If soreness lasts longer, you may be working too hard and need to ease up. Exercises should be done in a pain-free range of motion. How  Much Exercise Do You Need? Weight-bearing exercises 30 minutes on most days of the week. Do a 30-minutesession or multiple sessions spread out throughout the day. The benefits to your bones are the same.   Muscle-strengthening exercises Two to three days per week. If you don't have much time for strengthening/resistance training, do small amounts at a time. You can do just one body part each day. For example do arms one day, legs the next and trunk the next. You can also spread these exercises out during your normal day.  Balance, posture and functional exercises Every day or as often as needed. You may want to focus on one area more than the others. If you have fallen or lose your balance, spend time doing balance exercises. If you are getting rounded shoulders, work more on  posture exercises. If you have trouble climbing stairs or getting up from the couch, do more functional exercises. You can also perform these exercises at one time or spread them during your day. Work with a phyiscal therapist to learn the right exercises for you.    Denosumab: Patient drug information (Up-to-date) Copyright (780) [phone] [employer] All rights reserved.  Brand Names: [place]  ProliaRivka Barbara What is this drug used for?  .It is used to treat soft, brittle bones (osteoporosis).  .It is used for bone growth.  .It is used when treating some cancers.  .It may be given to you for other reasons. Talk with the doctor. What do I need to tell my doctor BEFORE I take this drug?  All products:  .If you have an allergy to denosumab or any other part of this drug.  .If you are allergic to any drugs like this one, any other drugs, foods, or other substances. Tell your doctor about the allergy and what signs you had, like rash; hives; itching; shortness of breath; wheezing; cough; swelling of face, lips, tongue, or throat; or any other signs.  .If you have low calcium levels.  ProliaT:  .If you are pregnant or may be pregnant. Do not take this drug if you are pregnant.  This is not a list of all drugs or health problems that interact with this drug.  Tell your doctor and pharmacist about all of your drugs (prescription or OTC, natural products, vitamins) and health problems. You must check to make sure that it is safe for you to take this drug with all of your drugs and health problems. Do not start, stop, or change the dose of any drug without checking with your doctor. What are some things I need to know or do while I take this drug?  All products:  .Tell dentists, surgeons, and other doctors that you use this drug.  .This drug may raise the chance of a broken leg. Talk with your doctor.  .Have your blood work checked. Talk with your doctor.  .Have a bone density test. Talk with your  doctor.  .Take calcium and vitamin D as you were told by your doctor.  .Have a dental exam before starting this drug.  .Take good care of your teeth. See a dentist often.  .If you smoke, talk with your doctor.  .Do not give to a child. Talk with your doctor.  .Tell your doctor if you are breast-feeding. You will need to talk about any risks to your baby.  Xgeva:  .This drug may cause harm to the unborn baby if you take it while you are pregnant. If you get pregnant while taking  this drug, call your doctor right away.  ProliaT:  .Very bad infections have been reported with use of this drug. If you have any infection, are taking antibiotics now or in the recent past, or have many infections, talk with your doctor.  .You may have more chance of getting an infection. Wash hands often. Stay away from people with infections, colds, or flu.  .Use birth control that you can trust to prevent pregnancy while taking this drug.  .If you are a man and your sex partner is pregnant or gets pregnant at any time while you are being treated, talk with your doctor. What are some side effects that I need to call my doctor about right away?  WARNING/CAUTION: Even though it may be rare, some people may have very bad and sometimes deadly side effects when taking a drug. Tell your doctor or get medical help right away if you have any of the following signs or symptoms that may be related to a very bad side effect:  All products:  .Signs of an allergic reaction, like rash; hives; itching; red, swollen, blistered, or peeling skin with or without fever; wheezing; tightness in the chest or throat; trouble breathing or talking; unusual hoarseness; or swelling of the mouth, face, lips, tongue, or throat.  .Signs of low calcium levels like muscle cramps or spasms, numbness and tingling, or seizures.  .Mouth sores.  .Any new or strange groin, hip, or thigh pain.  .This drug may cause jawbone problems. The chance may be higher  the longer you take this drug. The chance may be higher if you have cancer, dental problems, dentures that do not fit well, anemia, blood clotting problems, or an infection. The chance may also be higher if you are having dental work or if you are getting chemo, some steroid drugs, or radiation. Call your doctor right away if you have jaw swelling or pain.  Xgeva:  .Not hungry.  .Muscle pain or weakness.  .Seizures.  .Shortness of breath.  ProliaT:  .Signs of infection. These include a fever of 100.17F (38C) or higher, chills, very bad sore throat, ear or sinus pain, cough, more sputum or change in color of sputum, pain with passing urine, mouth sores, wound that will not heal, or anal itching or pain.  .Signs of a pancreas problem (pancreatitis) like very bad stomach pain, very bad back pain, or very bad upset stomach or throwing up.  .Chest pain.  .A heartbeat that does not feel normal.  .Very bad skin irritation.  .Feeling very tired or weak.  .Bladder pain or pain when passing urine or change in how much urine is passed.  .Passing urine often.  .Swelling in the arms or legs. What are some other side effects of this drug?  All drugs may cause side effects. However, many people have no side effects or only have minor side effects. Call your doctor or get medical help if any of these side effects or any other side effects bother you or do not go away:  Xgeva:  .Feeling tired or weak.  Marland KitchenHeadache.  Marland KitchenUpset stomach or throwing up.  .Loose stools (diarrhea).  .Cough.  ProliaT:  .Back pain.  .Muscle or joint pain.  .Sore throat.  .Runny nose.  .Pain in arms or legs.  These are not all of the side effects that may occur. If you have questions about side effects, call your doctor. Call your doctor for medical advice about side effects.  You may report side effects to  your national health agency. How is this drug best taken?  Use this drug as ordered by your doctor. Read and follow the  dosing on the label closely.  .It is given as a shot into the fatty part of the skin. What do I do if I miss a dose?  .Call the doctor to find out what to do. How do I store and/or throw out this drug?  Marland KitchenThis drug will be given to you in a hospital or doctor's office. You will not store it at home.  Marland KitchenKeep all drugs out of the reach of children and pets.  .Check with your pharmacist about how to throw out unused drugs.  General drug facts  .If your symptoms or health problems do not get better or if they become worse, call your doctor.  .Do not share your drugs with others and do not take anyone else's drugs.  Marland KitchenKeep a list of all your drugs (prescription, natural products, vitamins, OTC) with you. Give this list to your doctor.  .Talk with the doctor before starting any new drug, including prescription or OTC, natural products, or vitamins.  .Some drugs may have another patient information leaflet. If you have any questions about this drug, please talk with your doctor, pharmacist, or other health care provider.  .If you think there has been an overdose, call your poison control center or get medical care right away. Be ready to tell or show what was taken, how much, and when it happened.

## 2016-08-03 DIAGNOSIS — M6281 Muscle weakness (generalized): Secondary | ICD-10-CM | POA: Diagnosis not present

## 2016-08-03 DIAGNOSIS — R2689 Other abnormalities of gait and mobility: Secondary | ICD-10-CM | POA: Diagnosis not present

## 2016-08-04 ENCOUNTER — Other Ambulatory Visit (INDEPENDENT_AMBULATORY_CARE_PROVIDER_SITE_OTHER): Payer: Medicare Other

## 2016-08-04 DIAGNOSIS — M810 Age-related osteoporosis without current pathological fracture: Secondary | ICD-10-CM

## 2016-08-04 LAB — VITAMIN D 25 HYDROXY (VIT D DEFICIENCY, FRACTURES): VITD: 41.4 ng/mL (ref 30.00–100.00)

## 2016-08-04 LAB — TSH: TSH: 0.54 u[IU]/mL (ref 0.35–4.50)

## 2016-08-05 ENCOUNTER — Telehealth: Payer: Self-pay

## 2016-08-05 DIAGNOSIS — R2689 Other abnormalities of gait and mobility: Secondary | ICD-10-CM | POA: Diagnosis not present

## 2016-08-05 DIAGNOSIS — M6281 Muscle weakness (generalized): Secondary | ICD-10-CM | POA: Diagnosis not present

## 2016-08-05 LAB — BASIC METABOLIC PANEL WITH GFR
BUN: 23 mg/dL (ref 7–25)
CHLORIDE: 103 mmol/L (ref 98–110)
CO2: 28 mmol/L (ref 20–31)
CREATININE: 0.7 mg/dL (ref 0.60–0.93)
Calcium: 9.1 mg/dL (ref 8.6–10.4)
GFR, Est African American: >89 mL/min (ref >=60)
GFR, Est Non African American: 83 mL/min (ref >=60)
GLUCOSE: 73 mg/dL (ref 65–99)
Potassium: 4.8 mmol/L (ref 3.5–5.3)
Sodium: 139 mmol/L (ref 135–146)

## 2016-08-05 LAB — PARATHYROID HORMONE, INTACT (NO CA): PTH: 29 pg/mL (ref 15–65)

## 2016-08-05 NOTE — Telephone Encounter (Signed)
-----   Message from Philemon Kingdom, MD sent at 08/05/2016 12:35 PM EST ----- Almyra Free, can you please call pt: All labs are normal. We can continue with Prolia -as soon as we hear from her insurance . She was also asking me about her neuropathy at last visit >> tried to start alpha lipoic acid (600 mg 2x a day) along with the B complex.

## 2016-08-05 NOTE — Telephone Encounter (Signed)
Tried to contact patient, patient number rang, and went to busy signal. Will try again.

## 2016-08-06 ENCOUNTER — Telehealth: Payer: Self-pay

## 2016-08-06 NOTE — Telephone Encounter (Signed)
Called patient and gave lab results. Patient had no questions or concerns.  

## 2016-08-10 DIAGNOSIS — R2689 Other abnormalities of gait and mobility: Secondary | ICD-10-CM | POA: Diagnosis not present

## 2016-08-10 DIAGNOSIS — M6281 Muscle weakness (generalized): Secondary | ICD-10-CM | POA: Diagnosis not present

## 2016-08-12 ENCOUNTER — Encounter: Payer: Self-pay | Admitting: Podiatry

## 2016-08-12 ENCOUNTER — Ambulatory Visit (INDEPENDENT_AMBULATORY_CARE_PROVIDER_SITE_OTHER): Payer: Medicare Other | Admitting: Podiatry

## 2016-08-12 ENCOUNTER — Other Ambulatory Visit: Payer: Self-pay

## 2016-08-12 ENCOUNTER — Telehealth: Payer: Self-pay

## 2016-08-12 DIAGNOSIS — R2689 Other abnormalities of gait and mobility: Secondary | ICD-10-CM | POA: Diagnosis not present

## 2016-08-12 DIAGNOSIS — M6281 Muscle weakness (generalized): Secondary | ICD-10-CM | POA: Diagnosis not present

## 2016-08-12 DIAGNOSIS — G629 Polyneuropathy, unspecified: Secondary | ICD-10-CM | POA: Diagnosis not present

## 2016-08-12 MED ORDER — TERIPARATIDE (RECOMBINANT) 600 MCG/2.4ML ~~LOC~~ SOLN: 20.0000 ug | Freq: Every day | SUBCUTANEOUS | 2 refills | Status: DC

## 2016-08-12 NOTE — Telephone Encounter (Signed)
Called and left message for patient regarding medications, advised that we were awaiting her prolia, and deleted the other medication off her med list, it was placed by error. Gave call back number if any questions.

## 2016-08-12 NOTE — Progress Notes (Signed)
She presents today for diabetic checkup and wants me to take a look at her every 6 months. She is mostly concerned today about the NeuRemedy that she takes for the neuropathy. She is also concerned about the arthritis in her feet and hands and spine.  Objective: Vital signs are stable she is alert and oriented 3. Pulses are palpable. Neurologic sensorium is intact. Deep tendon reflexes are intact. Muscle strength +5 over 5 dorsiflexion plantar flexors and inverters and everters all intrinsic musculature is intact. Orthopedic evaluation demonstrates severe hammertoe deformities HEB deformities and review of radiographs do demonstrate osteoarthritic changes of the hammertoes and the forefoot and midfoot as well. 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 No open lesions or wounds toenails are thick yellow and dystrophic.  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  Plan: She would like a referral to rheumatology.  Follow-up with her in 6 months

## 2016-08-19 DIAGNOSIS — R2689 Other abnormalities of gait and mobility: Secondary | ICD-10-CM | POA: Diagnosis not present

## 2016-08-19 DIAGNOSIS — M6281 Muscle weakness (generalized): Secondary | ICD-10-CM | POA: Diagnosis not present

## 2016-08-20 ENCOUNTER — Telehealth: Payer: Self-pay

## 2016-08-20 DIAGNOSIS — M6281 Muscle weakness (generalized): Secondary | ICD-10-CM | POA: Diagnosis not present

## 2016-08-20 DIAGNOSIS — R2689 Other abnormalities of gait and mobility: Secondary | ICD-10-CM | POA: Diagnosis not present

## 2016-08-20 NOTE — Telephone Encounter (Signed)
Pa has been received for the prolia injection. Patient is approved and would owe 250 $ for the prolia. Please let me know if the patient agrees to injection and price. Thanks!

## 2016-08-20 NOTE — Telephone Encounter (Signed)
Called patient, and she is going to research to make sure this copay is correct, and will call back next week to make an appointment.

## 2016-08-20 NOTE — Telephone Encounter (Signed)
Called patient, notified of $250 copy for prolia. Patient is going to research the information, and will call us next week to let us know if she is going to schedule.

## 2016-08-23 DIAGNOSIS — M6281 Muscle weakness (generalized): Secondary | ICD-10-CM | POA: Diagnosis not present

## 2016-08-23 DIAGNOSIS — R2689 Other abnormalities of gait and mobility: Secondary | ICD-10-CM | POA: Diagnosis not present

## 2016-08-25 DIAGNOSIS — M6281 Muscle weakness (generalized): Secondary | ICD-10-CM | POA: Diagnosis not present

## 2016-08-25 DIAGNOSIS — R2689 Other abnormalities of gait and mobility: Secondary | ICD-10-CM | POA: Diagnosis not present

## 2016-08-26 ENCOUNTER — Telehealth: Payer: Self-pay

## 2016-08-26 NOTE — Telephone Encounter (Signed)
Patient called and scheduled injection for Prolia injection.

## 2016-08-31 DIAGNOSIS — R2689 Other abnormalities of gait and mobility: Secondary | ICD-10-CM | POA: Diagnosis not present

## 2016-08-31 DIAGNOSIS — M6281 Muscle weakness (generalized): Secondary | ICD-10-CM | POA: Diagnosis not present

## 2016-09-01 ENCOUNTER — Ambulatory Visit: Payer: Medicare Other

## 2016-09-02 DIAGNOSIS — R2689 Other abnormalities of gait and mobility: Secondary | ICD-10-CM | POA: Diagnosis not present

## 2016-09-02 DIAGNOSIS — M6281 Muscle weakness (generalized): Secondary | ICD-10-CM | POA: Diagnosis not present

## 2016-09-07 DIAGNOSIS — R2689 Other abnormalities of gait and mobility: Secondary | ICD-10-CM | POA: Diagnosis not present

## 2016-09-07 DIAGNOSIS — M6281 Muscle weakness (generalized): Secondary | ICD-10-CM | POA: Diagnosis not present

## 2016-09-09 DIAGNOSIS — M6281 Muscle weakness (generalized): Secondary | ICD-10-CM | POA: Diagnosis not present

## 2016-09-09 DIAGNOSIS — R2689 Other abnormalities of gait and mobility: Secondary | ICD-10-CM | POA: Diagnosis not present

## 2016-10-13 DIAGNOSIS — M25512 Pain in left shoulder: Secondary | ICD-10-CM | POA: Diagnosis not present

## 2016-10-13 DIAGNOSIS — M810 Age-related osteoporosis without current pathological fracture: Secondary | ICD-10-CM | POA: Diagnosis not present

## 2016-10-13 DIAGNOSIS — E663 Overweight: Secondary | ICD-10-CM | POA: Diagnosis not present

## 2016-10-13 DIAGNOSIS — M25511 Pain in right shoulder: Secondary | ICD-10-CM | POA: Diagnosis not present

## 2016-10-13 DIAGNOSIS — M2550 Pain in unspecified joint: Secondary | ICD-10-CM | POA: Diagnosis not present

## 2016-10-13 DIAGNOSIS — Z6827 Body mass index (BMI) 27.0-27.9, adult: Secondary | ICD-10-CM | POA: Diagnosis not present

## 2016-10-13 DIAGNOSIS — M150 Primary generalized (osteo)arthritis: Secondary | ICD-10-CM | POA: Diagnosis not present

## 2016-11-03 DIAGNOSIS — E663 Overweight: Secondary | ICD-10-CM | POA: Diagnosis not present

## 2016-11-03 DIAGNOSIS — M2550 Pain in unspecified joint: Secondary | ICD-10-CM | POA: Diagnosis not present

## 2016-11-03 DIAGNOSIS — R768 Other specified abnormal immunological findings in serum: Secondary | ICD-10-CM | POA: Diagnosis not present

## 2016-11-03 DIAGNOSIS — M810 Age-related osteoporosis without current pathological fracture: Secondary | ICD-10-CM | POA: Diagnosis not present

## 2016-11-03 DIAGNOSIS — Z6827 Body mass index (BMI) 27.0-27.9, adult: Secondary | ICD-10-CM | POA: Diagnosis not present

## 2016-11-03 DIAGNOSIS — M150 Primary generalized (osteo)arthritis: Secondary | ICD-10-CM | POA: Diagnosis not present

## 2016-11-16 DIAGNOSIS — N813 Complete uterovaginal prolapse: Secondary | ICD-10-CM | POA: Diagnosis not present

## 2016-11-16 DIAGNOSIS — Z4689 Encounter for fitting and adjustment of other specified devices: Secondary | ICD-10-CM | POA: Diagnosis not present

## 2017-01-03 DIAGNOSIS — G629 Polyneuropathy, unspecified: Secondary | ICD-10-CM | POA: Diagnosis not present

## 2017-01-10 ENCOUNTER — Other Ambulatory Visit: Payer: Self-pay | Admitting: Internal Medicine

## 2017-01-10 DIAGNOSIS — Z1231 Encounter for screening mammogram for malignant neoplasm of breast: Secondary | ICD-10-CM

## 2017-01-14 ENCOUNTER — Telehealth: Payer: Self-pay

## 2017-01-14 NOTE — Telephone Encounter (Signed)
Spoke with the patient and she is interested in Abigail Cohen- patient also stated she received Porlia injection on 09/01/16 but there was no documentation in epic about this- I spoke with Colletta Maryland and she is going to look into how the charges can be fixed, I am going to discuss SOB with Joycelyn Schmid, and patient will be scheduled for injection once all is completed

## 2017-02-02 ENCOUNTER — Telehealth: Payer: Self-pay | Admitting: Internal Medicine

## 2017-02-02 NOTE — Telephone Encounter (Signed)
I think she just wants to know she is okay insurance wise to come get her Prolia.

## 2017-02-02 NOTE — Telephone Encounter (Signed)
Patient has questions about her last prolia injection and the insurance coverage before she comes for her next appointment. She was expecting a call from [place] a week after and has not heard anything. Call patient to advise, okay to leave a detailed message.

## 2017-02-07 NOTE — Telephone Encounter (Signed)
Routing to you °

## 2017-02-07 NOTE — Telephone Encounter (Signed)
Do we know anything regarding this patient prolia information???  Thanks!

## 2017-02-07 NOTE — Telephone Encounter (Signed)
Patient called to check the status of the note below. Patient is highly upset that she has not heard anything back. Call patient to advise as soon as possible, okay to leave a detailed message.

## 2017-02-08 NOTE — Telephone Encounter (Signed)
Patient has been notified- left vm and requested she call back

## 2017-02-08 NOTE — Telephone Encounter (Signed)
There is no PA required for Prolia and her out of pocket cost is $0- ready for injection

## 2017-02-10 ENCOUNTER — Encounter: Payer: Self-pay | Admitting: Podiatry

## 2017-02-10 ENCOUNTER — Ambulatory Visit (INDEPENDENT_AMBULATORY_CARE_PROVIDER_SITE_OTHER): Payer: Medicare Other | Admitting: Podiatry

## 2017-02-10 DIAGNOSIS — G629 Polyneuropathy, unspecified: Secondary | ICD-10-CM

## 2017-02-10 DIAGNOSIS — M79676 Pain in unspecified toe(s): Secondary | ICD-10-CM | POA: Diagnosis not present

## 2017-02-10 DIAGNOSIS — B351 Tinea unguium: Secondary | ICD-10-CM

## 2017-02-10 NOTE — Progress Notes (Signed)
   Subjective:    Patient ID: Abigail Cohen, female    DOB: [birth year], 76 y.o.   MRN: [id number]  HPI    Review of Systems     Objective:   Physical Exam        Assessment & Plan:

## 2017-02-10 NOTE — Progress Notes (Signed)
Patient ID: Abigail Cohen, female   DOB: 1941/01/20, 76 y.o.   MRN: [id number] She presents today for chief complaint of painful elongated toenails. She was referred to rheumatology who told her that she may have rheumatoid arthritis.  Objective: Nails are long thick yellow dystrophic with mycotic.  Assessment: Pain in limb secondary to onychomycosis.  Plan: Debridement of toenails. Follow up with her in 6 months

## 2017-02-14 ENCOUNTER — Telehealth: Payer: Self-pay

## 2017-02-14 NOTE — Telephone Encounter (Signed)
Spoke with the patient and she is scheduled for injection 03/03/17 at 10am

## 2017-02-21 ENCOUNTER — Ambulatory Visit
Admission: RE | Admit: 2017-02-21 | Discharge: 2017-02-21 | Disposition: A | Discharge status: Home / Self Care | Payer: Medicare Other | Source: Ambulatory Visit | Attending: Internal Medicine | Admitting: Internal Medicine

## 2017-02-21 DIAGNOSIS — Z1231 Encounter for screening mammogram for malignant neoplasm of breast: Secondary | ICD-10-CM | POA: Diagnosis not present

## 2017-03-03 ENCOUNTER — Ambulatory Visit (INDEPENDENT_AMBULATORY_CARE_PROVIDER_SITE_OTHER): Payer: Medicare Other

## 2017-03-03 DIAGNOSIS — M810 Age-related osteoporosis without current pathological fracture: Secondary | ICD-10-CM | POA: Diagnosis not present

## 2017-03-03 MED ORDER — DENOSUMAB 60 MG/ML ~~LOC~~ SOLN
60.0000 mg | Freq: Once | SUBCUTANEOUS | Status: AC
  Administered 2017-03-03: 60 mg via SUBCUTANEOUS

## 2017-03-03 MED ORDER — DENOSUMAB 60 MG/ML ~~LOC~~ SOLN: 60.0000 mg | Freq: Once | SUBCUTANEOUS | Status: DC

## 2017-04-29 DIAGNOSIS — M1990 Unspecified osteoarthritis, unspecified site: Secondary | ICD-10-CM | POA: Diagnosis not present

## 2017-04-29 DIAGNOSIS — Z23 Encounter for immunization: Secondary | ICD-10-CM | POA: Diagnosis not present

## 2017-04-29 DIAGNOSIS — Z79899 Other long term (current) drug therapy: Secondary | ICD-10-CM | POA: Diagnosis not present

## 2017-04-29 DIAGNOSIS — Z136 Encounter for screening for cardiovascular disorders: Secondary | ICD-10-CM | POA: Diagnosis not present

## 2017-04-29 DIAGNOSIS — Z1389 Encounter for screening for other disorder: Secondary | ICD-10-CM | POA: Diagnosis not present

## 2017-04-29 DIAGNOSIS — M810 Age-related osteoporosis without current pathological fracture: Secondary | ICD-10-CM | POA: Diagnosis not present

## 2017-04-29 DIAGNOSIS — Z0000 Encounter for general adult medical examination without abnormal findings: Secondary | ICD-10-CM | POA: Diagnosis not present

## 2017-04-29 DIAGNOSIS — G629 Polyneuropathy, unspecified: Secondary | ICD-10-CM | POA: Diagnosis not present

## 2017-05-09 DIAGNOSIS — M810 Age-related osteoporosis without current pathological fracture: Secondary | ICD-10-CM | POA: Diagnosis not present

## 2017-05-09 DIAGNOSIS — E663 Overweight: Secondary | ICD-10-CM | POA: Diagnosis not present

## 2017-05-09 DIAGNOSIS — M2550 Pain in unspecified joint: Secondary | ICD-10-CM | POA: Diagnosis not present

## 2017-05-09 DIAGNOSIS — Z6827 Body mass index (BMI) 27.0-27.9, adult: Secondary | ICD-10-CM | POA: Diagnosis not present

## 2017-05-09 DIAGNOSIS — M150 Primary generalized (osteo)arthritis: Secondary | ICD-10-CM | POA: Diagnosis not present

## 2017-05-09 DIAGNOSIS — R768 Other specified abnormal immunological findings in serum: Secondary | ICD-10-CM | POA: Diagnosis not present

## 2017-05-09 DIAGNOSIS — M779 Enthesopathy, unspecified: Secondary | ICD-10-CM | POA: Diagnosis not present

## 2017-05-20 DIAGNOSIS — N812 Incomplete uterovaginal prolapse: Secondary | ICD-10-CM | POA: Diagnosis not present

## 2017-05-20 DIAGNOSIS — Z4689 Encounter for fitting and adjustment of other specified devices: Secondary | ICD-10-CM | POA: Diagnosis not present

## 2017-05-20 DIAGNOSIS — N816 Rectocele: Secondary | ICD-10-CM | POA: Diagnosis not present

## 2017-07-22 ENCOUNTER — Telehealth: Payer: Self-pay | Admitting: *Deleted

## 2017-07-26 ENCOUNTER — Other Ambulatory Visit: Payer: Self-pay | Admitting: Podiatry

## 2017-07-26 ENCOUNTER — Ambulatory Visit (INDEPENDENT_AMBULATORY_CARE_PROVIDER_SITE_OTHER): Payer: Medicare Other

## 2017-07-26 ENCOUNTER — Ambulatory Visit (INDEPENDENT_AMBULATORY_CARE_PROVIDER_SITE_OTHER): Payer: Medicare Other | Admitting: Podiatry

## 2017-07-26 DIAGNOSIS — M25571 Pain in right ankle and joints of right foot: Secondary | ICD-10-CM

## 2017-07-26 DIAGNOSIS — M7750 Other enthesopathy of unspecified foot: Secondary | ICD-10-CM

## 2017-07-26 DIAGNOSIS — M778 Other enthesopathies, not elsewhere classified: Secondary | ICD-10-CM

## 2017-07-26 DIAGNOSIS — M779 Enthesopathy, unspecified: Principal | ICD-10-CM

## 2017-07-26 DIAGNOSIS — M7751 Other enthesopathy of right foot: Secondary | ICD-10-CM | POA: Diagnosis not present

## 2017-07-26 NOTE — Progress Notes (Signed)
She presents today chief complaint of pain to her right ankle.  She states that I may have overdone it a couple of weeks ago with activities but I really never twisted it or anything that I know of.  She is tried elevation icing decreasing her activities she states that it just seems to make it worse.  Objective: Vital signs are stable she is alert oriented x3 pulses are palpable.  Neurologic sensorium is intact.  Degenerative flexors are intact.  No ecchymosis no open lesions or wounds are noted.  She does have mild edema overlying the anterior lateral ankle.  She has pain on palpation of the sinus tarsi and on end range of motion of the subtalar joint.  Radiographs taken in the office today do not demonstrate any type of acute osseous abnormality.  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  Plan: I injected the area today with Kenalog and local anesthetic.  This is performed with 20 mg of Kenalog 5 mg Marcaine after sterile Betadine skin prep and verbal consent.  She tolerated procedure well without complications and left with decreased pain.

## 2017-08-02 ENCOUNTER — Ambulatory Visit (INDEPENDENT_AMBULATORY_CARE_PROVIDER_SITE_OTHER): Payer: Medicare Other | Admitting: Internal Medicine

## 2017-08-02 ENCOUNTER — Encounter: Payer: Self-pay | Admitting: Internal Medicine

## 2017-08-02 ENCOUNTER — Ambulatory Visit (INDEPENDENT_AMBULATORY_CARE_PROVIDER_SITE_OTHER): Payer: Medicare Other | Admitting: Podiatry

## 2017-08-02 ENCOUNTER — Encounter: Payer: Self-pay | Admitting: Podiatry

## 2017-08-02 VITALS — BP 120/80 | HR 100 | Ht <= 58 in | Wt 129.0 lb

## 2017-08-02 DIAGNOSIS — M7671 Peroneal tendinitis, right leg: Secondary | ICD-10-CM | POA: Diagnosis not present

## 2017-08-02 DIAGNOSIS — M810 Age-related osteoporosis without current pathological fracture: Secondary | ICD-10-CM | POA: Diagnosis not present

## 2017-08-02 LAB — BASIC METABOLIC PANEL WITH GFR
BUN / CREAT RATIO: 33 (calc) — AB (ref 6–22)
BUN: 31 mg/dL — ABNORMAL HIGH (ref 7–25)
CO2: 28 mmol/L (ref 20–32)
Calcium: 9.3 mg/dL (ref 8.6–10.4)
Chloride: 101 mmol/L (ref 98–110)
Creat: 0.94 mg/dL — ABNORMAL HIGH (ref 0.60–0.93)
GFR, EST NON AFRICAN AMERICAN: 58 mL/min/{1.73_m2} — AB (ref >=60)
GFR, Est African American: 67 mL/min/{1.73_m2} (ref >=60)
GLUCOSE: 72 mg/dL (ref 65–99)
POTASSIUM: 4.5 mmol/L (ref 3.5–5.3)
SODIUM: 137 mmol/L (ref 135–146)

## 2017-08-02 LAB — VITAMIN D 25 HYDROXY (VIT D DEFICIENCY, FRACTURES): VITD: 48.18 ng/mL (ref 30.00–100.00)

## 2017-08-02 MED ORDER — METHYLPREDNISOLONE 4 MG PO TBPK: ORAL_TABLET | ORAL | 0 refills | Status: DC

## 2017-08-02 NOTE — Progress Notes (Signed)
She presents today for follow-up of her subtalar joint capsulitis of the right foot.  She states that that seems to be doing much better however have severe pain rating of the back of the heel as she points to the lateral malleolus of the right foot and ankle.  She states that is painful right here she points to a swollen area.  Objective: Vital signs are stable alert and oriented x3.  Pulses are palpable.  She has pain on palpation of the peroneal tendons as well as plantar flexion and abduction lateral flexion and eversion.  It seems to be located primarily to the posterior inferior lateral malleolar region.  Possible peroneal tendinitis or tear.  Assessment: Peroneal tendinitis right resolving subtalar joint capsulitis right.  Plan: After sterile Betadine skin prep and verbal confirmation I injected the area today 20 mg Kenalog 5 mg Marcaine to the point of maximal tenderness.  Started her on a Medrol Dosepak and I will follow-up with her in 3 weeks.

## 2017-08-02 NOTE — Patient Instructions (Addendum)
Please stop at the lab.  Let's continue with Prolia  Injections.  Check out the book:    Please come back for a follow-up appointment in 1 year.

## 2017-08-02 NOTE — Progress Notes (Signed)
Patient ID: Abigail Cohen, female   DOB: Aug 28, 1940, 77 y.o.   MRN: [id number]    HPI  Abigail Cohen is a 77 y.o.-year-old female, returning for follow-up for osteoporosis.  Last visit a year ago.  She had tendonitis in her foot.  Pt was dx with OP in 2015.  I reviewed previous DXA scan reports: Date L1-L4 T score FN T score 33% distal Radius UD radius  05/10/2016 ([facility]) n/a RFN: -2.0 LFN: -1.5 -3.1 -2.8  05/09/2014 (Hologic) n/a RFN: n/a LFN: -1.7 -2.6 -1.3  04/05/2012 (Lun[facility]) n/a RFN: -1.7 -2.2   11/02/2006 (Lunar) n/a RFN: -1.6 -2.1   10/04/2001 (Hologic)  -0.5 LFN: -0.8 n/a    No fractures or falls since last visit.  Denies dizziness/vertigo/orthostasis/poor vision.  Previous osteoporosis treatments: - Fosamax 2015-06/2016 - Prolia - since 08/2016, 02/2017  No vitamin D deficiency. Reviewed available vit D levels -  Last level was normal at last visit Lab Results  Component Value Date   VD25OH 41.40 08/04/2016  11/24/2015: vit D 49.2 06/03/2014: Vit D 39.7  She takes vitamin D 250 units daily + calcium citrate 250 mg in a.m. + multivitamin in am with a MVI.   She did start weightbearing exercises.  She [facility] on [address] >> stopped, now at home on her glider. Now less exercises during tendonitis.   No extra vitamin A doses.  Menopause was in her 56s.   No FH of osteoporosis.  No h/o hyper/hypocalcemia or hyperparathyroidism. No h/o kidney stones. Lab Results  Component Value Date   PTH 29 08/04/2016   CALCIUM 9.1 08/04/2016  04/28/2016: Calcium 9.8  No H/o No thyrotoxicosis. Lab Results  Component Value Date   TSH 0.54 08/04/2016  05/20/2014: Total T4 7.0 (4.5-12)  No h/o CKD. Last BUN/Cr: Lab Results  Component Value Date   BUN 23 08/04/2016   CREATININE 0.70 08/04/2016  06/18/2016: EGFR 78, BUN/creatinine 29/0.72  04/28/2016: EGFR 85: BUN/creatinine 24/0.67  She has a h/o 1 steroid inj. for hip bursitis in  the past, not recently.  ROS: Constitutional: no weight gain/no weight loss, no fatigue, no subjective hyperthermia, no subjective hypothermia Eyes: no blurry vision, no xerophthalmia ENT: no sore throat, no nodules palpated in throat, no dysphagia, no odynophagia, no hoarseness Cardiovascular: no CP/no SOB/no palpitations/no leg swelling Respiratory: no cough/no SOB/no wheezing Gastrointestinal: no N/no V/no D/no C/no acid reflux Musculoskeletal: + muscle aches/+ joint aches Skin: no rashes, no hair loss Neurological: no tremors/no numbness/no tingling/no dizziness  I reviewed pt's medications, allergies, PMH, social hx, family hx, and changes were documented in the history of present illness. Otherwise, unchanged from my initial visit note.  Past Medical History:  Diagnosis Date  . Borderline hypertension   . Family history of anesthesia complication    daughter gets nauseated  . Knee joint replacement status   . Osteoarthritis   . Osteopenia   . Uterine prolapse   . Wears glasses   . Wears partial dentures    upper and lower partial   Past Surgical History:  Procedure Laterality Date  . COLONOSCOPY    . HERNIA REPAIR  07/07/12   Dr Margot Chimes  . INGUINAL HERNIA REPAIR  07/07/2012   Procedure: HERNIA REPAIR INGUINAL ADULT;  Surgeon: Haywood Lasso, MD;  Location: [facility];  Service: General;  Laterality: Left;  left inguinal area  . REPLACEMENT TOTAL KNEE BILATERAL  2003  . TONSILLECTOMY  1945  . WISDOM  TOOTH EXTRACTION     Social History   Social History  . Marital status: Divorced    Spouse name: N/A  . Number of children: N/A  . Years of education: N/A   Occupational History  . Not on file.   Social History Main Topics  . Smoking status: Former Smoker    Start date: 07/05/2004    Quit date: 06/30/2005  . Smokeless tobacco: Never Used  . Alcohol use Yes     Comment: social  . Drug use: No   Current Outpatient Medications  Medication Sig  Dispense Refill  . aspirin 81 MG tablet Take 81 mg by mouth daily.    Marland Kitchen b complex vitamins tablet Take 2 tablets by mouth daily.    Marland Kitchen CALCIUM-VITAMIN D PO Take 1,500 mg by mouth daily.    Mariane Baumgarten Calcium (STOOL SOFTENER PO) Take 100 mg by mouth daily.    Marland Kitchen estradiol (ESTRACE) 0.1 MG/GM vaginal cream Place 2 g vaginally 2 (two) times a week.    . Multiple Vitamin (MULTIVITAMIN) tablet Take 1 tablet by mouth daily.     No current facility-administered medications for this visit.    No Known Allergies Family History  Problem Relation Age of Onset  . Heart disease Mother   . Heart disease Father   . Cancer Sister        endometrial   PE: Ht 4' 9.75" (1.467 m)   Wt 129 lb (58.5 kg)   BMI 27.19 kg/m  Wt Readings from Last 3 Encounters:  08/02/16 137 lb (62.1 kg)  07/25/12 136 lb 12.8 oz (62.1 kg)  07/07/12 135 lb (61.2 kg)   Constitutional: Normal weight, in NAD Eyes: PERRLA, EOMI, no exophthalmos ENT: moist mucous membranes, no thyromegaly, no cervical lymphadenopathy Cardiovascular: RRR, No MRG Respiratory: CTA B Gastrointestinal: abdomen soft, NT, ND, BS+ Musculoskeletal: no deformities, strength intact in all 4 Skin: moist, warm, no rashes Neurological: no tremor with outstretched hands, + 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 Osteoporosis  Plan: 1. Osteoporosis - Likely age-related + postmenopausal - Reviewed again her latest DXA scan reports: Since the studies were checked on different machines, they were not directly comparable.  However, based on the T-scores in 2017, she is at increased risk for fractures. - She is on good dietary + supplementation of calcium and vitamin D.  She does get 1000-1200 mg of calcium daily and she gets enough vitamin D per latest vitamin D level, which was normal, year ago.  We will recheck this today. - again discussed fall precautions. - She did start weightbearing exercises since last visit - At last visit, we also  discussed the different medication classes, benefits, and side effects, and I suggested Prolia, with which she agreed.  She had her first Prolia injection 08/2016, and her second 02/2017.  She is due for the third 1 next month.  She has no side effects from Prolia.   - She has no dental work in progress or coming up.  No jaw pain, no hip pain. - We will need to get another bone mineral density-05/2018, on [facility] DXA machine.  We discussed that unchanged or slightly higher T-scores are desirable, but the most important element indicating success is her not having a fracture. - We will check her vitamin D level and BMP today  - will see pt back in 1 year  - time spent with the patient: 25 minutes, of which >50% was spent in  obtaining information about her symptoms, reviewing her previous labs, evaluations, and treatments, counseling her about her condition (please see the discussed topics above), and developing a plan to further investigate it; she had a number of questions which I addressed.  Component     Latest Ref Rng & Units 08/02/2017  Glucose     65 - 99 mg/dL 72  BUN     7 - 25 mg/dL 31 (H)  Creatinine     0.60 - 0.93 mg/dL 0.94 (H)  GFR, Est Non African American     > OR = 60 mL/min/1.30m 58 (L)  GFR, Est African American     > OR = 60 mL/min/1.724m67  BUN/Creatinine Ratio     6 - 22 (calc) 33 (H)  Sodium     135 - 146 mmol/L 137  Potassium     3.5 - 5.3 mmol/L 4.5  Chloride     98 - 110 mmol/L 101  CO2     20 - 32 mmol/L 28  Calcium     8.6 - 10.4 mg/dL 9.3  VITD     30.00 - 100.00 ng/mL 48.18   Labs pointed towards mild dehydration.  Will advise her to stay hydrated. Vitamin D is normal.    CrPhilemon KingdomMD PhD [facility]

## 2017-08-09 NOTE — Telephone Encounter (Signed)
Pt states she is having sharp stabbing pain in her feet, she has arthritis and peripheral neuropathy. Pt states she began having this pain after wearing new Vionic and running up stairs, then Birkenstocks and running up stairs. Pt states she has worsening pain with walking and has been resting, ice and elevation, wanted to know if she should go to her Rheumatologist. I told her yes and to keep her scheduled appt with our office 08/02/2017.

## 2017-08-11 ENCOUNTER — Ambulatory Visit: Payer: Medicare Other | Admitting: Podiatry

## 2017-08-23 ENCOUNTER — Ambulatory Visit (INDEPENDENT_AMBULATORY_CARE_PROVIDER_SITE_OTHER): Payer: Medicare Other | Admitting: Podiatry

## 2017-08-23 DIAGNOSIS — M7671 Peroneal tendinitis, right leg: Secondary | ICD-10-CM

## 2017-08-23 DIAGNOSIS — M7751 Other enthesopathy of right foot: Secondary | ICD-10-CM

## 2017-08-23 DIAGNOSIS — M779 Enthesopathy, unspecified: Secondary | ICD-10-CM

## 2017-08-23 DIAGNOSIS — M778 Other enthesopathies, not elsewhere classified: Secondary | ICD-10-CM

## 2017-08-23 NOTE — Progress Notes (Signed)
She presents today for follow-up of her peroneal tendinitis of her right foot and capsulitis.  She states that the right foot is doing much better now is about 95% also.  I think the Medrol really helped.  Objective: Vital signs are stable alert and oriented x3.  Pulses are palpable.  Neurologic sensorium is intact.  Deep tendon reflexes are intact no reproducible pain on palpation of the peroneal's around the lateral malleolus today.  There is no fluctuance no malodor no purulence no open lesions or wounds.  She has good abduction and plantar flexion and eversion against resistance.  No tenderness.  Assessment: Resolving peroneal tendinitis and subtalar joint capsulitis right.  Plan: Encouraged her to continue all conservative therapies including braces and anti-inflammatories follow-up with her in 1 month if necessary.

## 2017-08-29 ENCOUNTER — Telehealth: Payer: Self-pay | Admitting: Internal Medicine

## 2017-08-29 NOTE — Telephone Encounter (Signed)
Patient is calling prolia shot. She is stating she is due for her next one. And would like to know if we can give her a call to get this scheduled.   Please advise.

## 2017-08-29 NOTE — Telephone Encounter (Signed)
This is in regards to Abigail Cohen handles that. Sending to [place] to advise

## 2017-09-01 ENCOUNTER — Telehealth: Payer: Self-pay | Admitting: Internal Medicine

## 2017-09-01 NOTE — Telephone Encounter (Signed)
Patient is asking when do she need to come back to have her prolia injection.  Leave a message on voice mail. If she not there,

## 2017-09-01 NOTE — Telephone Encounter (Signed)
Last Prolia injection given 03/03/17. Pt advised and voiced understanding.  Nurse visit scheduled for 09/05/17.

## 2017-09-02 NOTE — Telephone Encounter (Signed)
It looks like she was put on schedule for Prolia injection, please advise that this is okay and that her benefits have been certified.

## 2017-09-06 ENCOUNTER — Ambulatory Visit (INDEPENDENT_AMBULATORY_CARE_PROVIDER_SITE_OTHER): Payer: Medicare Other

## 2017-09-06 DIAGNOSIS — M810 Age-related osteoporosis without current pathological fracture: Secondary | ICD-10-CM | POA: Diagnosis not present

## 2017-09-06 MED ORDER — DENOSUMAB 60 MG/ML ~~LOC~~ SOLN
60.0000 mg | Freq: Once | SUBCUTANEOUS | Status: AC
  Administered 2017-09-06: 60 mg via SUBCUTANEOUS

## 2017-09-07 NOTE — Telephone Encounter (Signed)
Pt received prolia 09/06/17

## 2017-09-20 ENCOUNTER — Ambulatory Visit: Payer: Medicare Other | Admitting: Podiatry

## 2017-11-08 DIAGNOSIS — M810 Age-related osteoporosis without current pathological fracture: Secondary | ICD-10-CM | POA: Diagnosis not present

## 2017-11-08 DIAGNOSIS — Z79899 Other long term (current) drug therapy: Secondary | ICD-10-CM | POA: Diagnosis not present

## 2017-11-08 DIAGNOSIS — Z6827 Body mass index (BMI) 27.0-27.9, adult: Secondary | ICD-10-CM | POA: Diagnosis not present

## 2017-11-08 DIAGNOSIS — R768 Other specified abnormal immunological findings in serum: Secondary | ICD-10-CM | POA: Diagnosis not present

## 2017-11-08 DIAGNOSIS — E663 Overweight: Secondary | ICD-10-CM | POA: Diagnosis not present

## 2017-11-08 DIAGNOSIS — M545 Low back pain: Secondary | ICD-10-CM | POA: Diagnosis not present

## 2017-11-08 DIAGNOSIS — M2550 Pain in unspecified joint: Secondary | ICD-10-CM | POA: Diagnosis not present

## 2017-11-08 DIAGNOSIS — M150 Primary generalized (osteo)arthritis: Secondary | ICD-10-CM | POA: Diagnosis not present

## 2017-11-24 DIAGNOSIS — Z4689 Encounter for fitting and adjustment of other specified devices: Secondary | ICD-10-CM | POA: Diagnosis not present

## 2017-11-24 DIAGNOSIS — M810 Age-related osteoporosis without current pathological fracture: Secondary | ICD-10-CM | POA: Diagnosis not present

## 2018-01-16 ENCOUNTER — Other Ambulatory Visit: Payer: Self-pay | Admitting: Internal Medicine

## 2018-01-16 DIAGNOSIS — Z1231 Encounter for screening mammogram for malignant neoplasm of breast: Secondary | ICD-10-CM

## 2018-02-21 ENCOUNTER — Ambulatory Visit (INDEPENDENT_AMBULATORY_CARE_PROVIDER_SITE_OTHER): Payer: Medicare Other | Admitting: Podiatry

## 2018-02-21 DIAGNOSIS — M7671 Peroneal tendinitis, right leg: Secondary | ICD-10-CM | POA: Diagnosis not present

## 2018-02-21 DIAGNOSIS — M778 Other enthesopathies, not elsewhere classified: Secondary | ICD-10-CM

## 2018-02-21 DIAGNOSIS — M779 Enthesopathy, unspecified: Secondary | ICD-10-CM | POA: Diagnosis not present

## 2018-02-21 NOTE — Progress Notes (Signed)
She presents today for follow-up of her tendinitis of her right foot she states that this doing good most of the most days she takes Advil and ice as it keeps it elevated.  She states that she is got swelling in bilateral lower extremities with pain.  Objective: Vital signs are stable stable she is alert and oriented x3 she has no erythema moderate pitting edema bilaterally pitting.  Pulses remain palpable.  Neurologic sensorium is intact degenerative flexors are intact and strength is normal symmetrical.  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  Toenails are slightly elongated today but does not request debridement..  Assessment bilateral edema resolution of plantar fasciitis and peroneal tendinitis right.  Plan: Discussed etiology pathology and surgical therapies at this point time I went ahead and discussed with her in great detail her edema.  I recommend she follow-up with her primary care doctor for initial evaluation of the edema and then possible follow-up with cardiology for heart issues or venous insufficiency studies.

## 2018-02-22 ENCOUNTER — Ambulatory Visit
Admission: RE | Admit: 2018-02-22 | Discharge: 2018-02-22 | Disposition: A | Discharge status: Home / Self Care | Payer: Medicare Other | Source: Ambulatory Visit | Attending: Internal Medicine | Admitting: Internal Medicine

## 2018-02-22 DIAGNOSIS — Z1231 Encounter for screening mammogram for malignant neoplasm of breast: Secondary | ICD-10-CM

## 2018-02-23 ENCOUNTER — Telehealth: Payer: Self-pay | Admitting: Internal Medicine

## 2018-02-23 NOTE — Telephone Encounter (Signed)
Patient stated she is due for her prolia but has not received a call from our office to schedule. Please advise

## 2018-02-28 ENCOUNTER — Telehealth: Payer: Self-pay | Admitting: Internal Medicine

## 2018-02-28 ENCOUNTER — Other Ambulatory Visit: Payer: Self-pay | Admitting: Internal Medicine

## 2018-02-28 DIAGNOSIS — R600 Localized edema: Secondary | ICD-10-CM | POA: Diagnosis not present

## 2018-02-28 DIAGNOSIS — R609 Edema, unspecified: Secondary | ICD-10-CM

## 2018-02-28 DIAGNOSIS — D649 Anemia, unspecified: Secondary | ICD-10-CM | POA: Diagnosis not present

## 2018-02-28 NOTE — Telephone Encounter (Signed)
Patient has been submitted to the portal for verification

## 2018-02-28 NOTE — Telephone Encounter (Signed)
error 

## 2018-03-02 DIAGNOSIS — M7061 Trochanteric bursitis, right hip: Secondary | ICD-10-CM | POA: Diagnosis not present

## 2018-03-02 DIAGNOSIS — M545 Low back pain: Secondary | ICD-10-CM | POA: Diagnosis not present

## 2018-03-02 NOTE — Telephone Encounter (Signed)
Patient scheduled on 03/14/18 for Prolia injection she owes $0

## 2018-03-03 ENCOUNTER — Ambulatory Visit
Admission: RE | Admit: 2018-03-03 | Discharge: 2018-03-03 | Disposition: A | Discharge status: Home / Self Care | Payer: Medicare Other | Source: Ambulatory Visit | Attending: Internal Medicine | Admitting: Internal Medicine

## 2018-03-03 DIAGNOSIS — R600 Localized edema: Secondary | ICD-10-CM | POA: Diagnosis not present

## 2018-03-03 DIAGNOSIS — R609 Edema, unspecified: Secondary | ICD-10-CM

## 2018-03-14 ENCOUNTER — Ambulatory Visit (INDEPENDENT_AMBULATORY_CARE_PROVIDER_SITE_OTHER): Payer: Medicare Other

## 2018-03-14 DIAGNOSIS — M810 Age-related osteoporosis without current pathological fracture: Secondary | ICD-10-CM

## 2018-03-14 MED ORDER — DENOSUMAB 60 MG/ML ~~LOC~~ SOSY
60.0000 mg | PREFILLED_SYRINGE | Freq: Once | SUBCUTANEOUS | Status: AC
  Administered 2018-03-14: 60 mg via SUBCUTANEOUS

## 2018-03-14 NOTE — Progress Notes (Signed)
Per orders of Dr. Cruzita Lederer injection of Prolia given today by Linus Galas CMA . Patient tolerated injection well.

## 2018-03-23 DIAGNOSIS — D509 Iron deficiency anemia, unspecified: Secondary | ICD-10-CM | POA: Diagnosis not present

## 2018-04-10 DIAGNOSIS — K2930 Chronic superficial gastritis without bleeding: Secondary | ICD-10-CM | POA: Diagnosis not present

## 2018-04-10 DIAGNOSIS — D509 Iron deficiency anemia, unspecified: Secondary | ICD-10-CM | POA: Diagnosis not present

## 2018-04-13 DIAGNOSIS — D509 Iron deficiency anemia, unspecified: Secondary | ICD-10-CM | POA: Diagnosis not present

## 2018-04-13 DIAGNOSIS — K2930 Chronic superficial gastritis without bleeding: Secondary | ICD-10-CM | POA: Diagnosis not present

## 2018-04-20 ENCOUNTER — Other Ambulatory Visit: Payer: Self-pay | Admitting: Obstetrics and Gynecology

## 2018-04-20 DIAGNOSIS — M810 Age-related osteoporosis without current pathological fracture: Secondary | ICD-10-CM

## 2018-05-02 DIAGNOSIS — D509 Iron deficiency anemia, unspecified: Secondary | ICD-10-CM | POA: Diagnosis not present

## 2018-05-02 DIAGNOSIS — G629 Polyneuropathy, unspecified: Secondary | ICD-10-CM | POA: Diagnosis not present

## 2018-05-02 DIAGNOSIS — Z0000 Encounter for general adult medical examination without abnormal findings: Secondary | ICD-10-CM | POA: Diagnosis not present

## 2018-05-02 DIAGNOSIS — Z23 Encounter for immunization: Secondary | ICD-10-CM | POA: Diagnosis not present

## 2018-05-02 DIAGNOSIS — M1990 Unspecified osteoarthritis, unspecified site: Secondary | ICD-10-CM | POA: Diagnosis not present

## 2018-05-02 DIAGNOSIS — M5136 Other intervertebral disc degeneration, lumbar region: Secondary | ICD-10-CM | POA: Diagnosis not present

## 2018-05-02 DIAGNOSIS — M810 Age-related osteoporosis without current pathological fracture: Secondary | ICD-10-CM | POA: Diagnosis not present

## 2018-05-02 DIAGNOSIS — Z1389 Encounter for screening for other disorder: Secondary | ICD-10-CM | POA: Diagnosis not present

## 2018-05-11 DIAGNOSIS — Z6827 Body mass index (BMI) 27.0-27.9, adult: Secondary | ICD-10-CM | POA: Diagnosis not present

## 2018-05-11 DIAGNOSIS — M2550 Pain in unspecified joint: Secondary | ICD-10-CM | POA: Diagnosis not present

## 2018-05-11 DIAGNOSIS — E663 Overweight: Secondary | ICD-10-CM | POA: Diagnosis not present

## 2018-05-11 DIAGNOSIS — M810 Age-related osteoporosis without current pathological fracture: Secondary | ICD-10-CM | POA: Diagnosis not present

## 2018-05-11 DIAGNOSIS — R768 Other specified abnormal immunological findings in serum: Secondary | ICD-10-CM | POA: Diagnosis not present

## 2018-05-11 DIAGNOSIS — M150 Primary generalized (osteo)arthritis: Secondary | ICD-10-CM | POA: Diagnosis not present

## 2018-06-06 DIAGNOSIS — Z4689 Encounter for fitting and adjustment of other specified devices: Secondary | ICD-10-CM | POA: Diagnosis not present

## 2018-06-22 ENCOUNTER — Telehealth: Payer: Self-pay | Admitting: Internal Medicine

## 2018-06-22 ENCOUNTER — Ambulatory Visit
Admission: RE | Admit: 2018-06-22 | Discharge: 2018-06-22 | Disposition: A | Discharge status: Home / Self Care | Payer: Medicare Other | Source: Ambulatory Visit | Attending: Obstetrics and Gynecology | Admitting: Obstetrics and Gynecology

## 2018-06-22 DIAGNOSIS — M810 Age-related osteoporosis without current pathological fracture: Secondary | ICD-10-CM | POA: Diagnosis not present

## 2018-06-22 DIAGNOSIS — Z780 Asymptomatic menopausal state: Secondary | ICD-10-CM | POA: Diagnosis not present

## 2018-06-22 DIAGNOSIS — M85852 Other specified disorders of bone density and structure, left thigh: Secondary | ICD-10-CM | POA: Diagnosis not present

## 2018-06-22 NOTE — Telephone Encounter (Signed)
Pt just calling to let us know that the 2 yr bone density has been completed and the results should be here soon

## 2018-06-23 NOTE — Telephone Encounter (Signed)
FYI

## 2018-06-23 NOTE — Telephone Encounter (Signed)
I already sent her the results

## 2018-06-26 ENCOUNTER — Telehealth: Payer: Self-pay

## 2018-06-26 NOTE — Telephone Encounter (Signed)
-----   Message from Philemon Kingdom, MD sent at 06/23/2018  7:49 AM EST ----- Lenna Sciara, can you please call pt: DXA scan results are back - Good news: Bone density improved at the level of the right hip and also forearm.  The left hip appears stable.

## 2018-08-02 ENCOUNTER — Ambulatory Visit (INDEPENDENT_AMBULATORY_CARE_PROVIDER_SITE_OTHER): Payer: Medicare Other | Admitting: Internal Medicine

## 2018-08-02 ENCOUNTER — Encounter: Payer: Self-pay | Admitting: Internal Medicine

## 2018-08-02 VITALS — BP 108/70 | HR 85 | Ht <= 58 in | Wt 130.0 lb

## 2018-08-02 DIAGNOSIS — M810 Age-related osteoporosis without current pathological fracture: Secondary | ICD-10-CM

## 2018-08-02 LAB — VITAMIN D 25 HYDROXY (VIT D DEFICIENCY, FRACTURES): VITD: 47.97 ng/mL (ref 30.00–100.00)

## 2018-08-02 LAB — BASIC METABOLIC PANEL WITH GFR
BUN/Creatinine Ratio: 39 (calc) — ABNORMAL HIGH (ref 6–22)
BUN: 27 mg/dL — AB (ref 7–25)
CALCIUM: 9.6 mg/dL (ref 8.6–10.4)
CHLORIDE: 103 mmol/L (ref 98–110)
CO2: 28 mmol/L (ref 20–32)
CREATININE: 0.69 mg/dL (ref 0.60–0.88)
GFR, EST AFRICAN AMERICAN: 95 mL/min/{1.73_m2} (ref >=60)
GFR, EST NON AFRICAN AMERICAN: 82 mL/min/{1.73_m2} (ref >=60)
Glucose, Bld: 93 mg/dL (ref 65–99)
Potassium: 4.6 mmol/L (ref 3.5–5.3)
Sodium: 139 mmol/L (ref 135–146)

## 2018-08-02 NOTE — Progress Notes (Signed)
Patient ID: Abigail Cohen, female   DOB: 04/21/1941, 78 y.o.   MRN: [id number]    HPI  Abigail Cohen is a 78 y.o.-year-old female, returning for follow-up for osteoporosis.  Last visit 1 year ago.  At last visit he had tendinitis in her foot and was not exercising.  This has resolved.   She was dx'ed with anemia >> started iron.  Pt was dx with OP in 2015.  Reviewed previous DXA scan reports: Date L1-L4 T score FN T score 33% distal Radius UD radius  06/22/2018 ([facility], [facility]) n/a RFN: -1.4 LFN: -1.4 -3.1 -2.2  05/10/2016 (Lunar) n/a RFN: -2.0 LFN: -1.5 -3.1 -2.8  05/09/2014 (Hologic) n/a RFN: n/a LFN: -1.7 -2.6 -1.3  04/05/2012 (Lunar) n/a RFN: -1.7 -2.2   11/02/2006 (Lunar) n/a RFN: -1.6 -2.1   10/04/2001 (Hologic)  -0.5 LFN: -0.8 n/a    No fractures or falls since last visit.  Denies dizziness, vertigo, orthostasis, poor vision.  Previous osteoporosis treatments: - Fosamax 2015-06/2016 - Prolia: 08/2016 02/2017 09/2017 03/2018  She denies jaw pain.  No dental work in progress or pending.  No new thigh/hip pain.  No history of vitamin D deficiency: Lab Results  Component Value Date   VD25OH 48.18 08/02/2017   VD25OH 41.40 08/04/2016  11/24/2015: vit D 49.2 06/03/2014: Vit D 39.7  She takes vitamin D 400 units daily + calcium citrate 250 mg in a.m.  + Multivitamins in a.m. (1000 mg daily)  She started weightbearing exercises before last visit.  She was [facility] to [facility] on [address] >> stopped.  She exercises at home on her glider.  At last visit she was not exercising much due to tendinitis.  This has resolved >> uses the glider.  Not taking vitamin A supplements.  Menopause was in her 21s.  No family history of osteoporosis.  No history of kidney stones.  No history of hyper/hypo-calcium Mia or hyperparathyroidism: Lab Results  Component Value Date   PTH 29 08/04/2016   CALCIUM 9.3 08/02/2017   CALCIUM 9.1 08/04/2016   04/28/2016: Calcium 9.8  No history of thyrotoxicosis: Lab Results  Component Value Date   TSH 0.54 08/04/2016  05/20/2014: Total T4 7.0 (4.5-12)  No history of CKD. Last BUN/Cr: Lab Results  Component Value Date   BUN 31 (H) 08/02/2017   CREATININE 0.94 (H) 08/02/2017  06/18/2016: EGFR 78, BUN/creatinine 29/0.72  04/28/2016: EGFR 85: BUN/creatinine 24/0.67  She had one steroid injection for hip bursitis in the past, but not recently.  She has peripheral neuropathy.  On ALA 600 mg bid -we started this at our first visit.  She also continues Benfothiamine.  ROS: Constitutional: no weight gain/no weight loss, no fatigue, no subjective hyperthermia, no subjective hypothermia Eyes: no blurry vision, no xerophthalmia ENT: no sore throat, no nodules palpated in neck, no dysphagia, no odynophagia, no hoarseness Cardiovascular: no CP/no SOB/no palpitations/no leg swelling Respiratory: no cough/no SOB/no wheezing Gastrointestinal: no N/no V/no D/no C/no acid reflux Musculoskeletal: no muscle aches/no joint aches Skin: no rashes, no hair loss Neurological: no tremors/no numbness/no tingling/no dizziness  I reviewed pt's medications, allergies, PMH, social hx, family hx, and changes were documented in the history of present illness. Otherwise, unchanged from my initial visit note.  Past Medical History:  Diagnosis Date  . Borderline hypertension   . Family history of anesthesia complication    daughter gets nauseated  . Knee joint replacement status   . Osteoarthritis   . Osteopenia   .  Uterine prolapse   . Wears glasses   . Wears partial dentures    upper and lower partial   Past Surgical History:  Procedure Laterality Date  . COLONOSCOPY    . HERNIA REPAIR  07/07/12   Dr Margot Chimes  . INGUINAL HERNIA REPAIR  07/07/2012   Procedure: HERNIA REPAIR INGUINAL ADULT;  Surgeon: Haywood Lasso, MD;  Location: [facility];  Service: General;  Laterality: Left;  left  inguinal area  . REPLACEMENT TOTAL KNEE BILATERAL  2003  . TONSILLECTOMY  1945  . WISDOM TOOTH EXTRACTION     Social History   Social History  . Marital status: Divorced    Spouse name: N/A  . Number of children: N/A  . Years of education: N/A   Occupational History  . Not on file.   Social History Main Topics  . Smoking status: Former Smoker    Start date: 07/05/2004    Quit date: 06/30/2005  . Smokeless tobacco: Never Used  . Alcohol use Yes     Comment: social  . Drug use: No   Current Outpatient Medications  Medication Sig Dispense Refill  . Benfotiamine 150 MG CAPS Take 300 mg by mouth.    Marland Kitchen CALCIUM-VITAMIN D PO Take 1,500 mg by mouth daily.    Mariane Baumgarten Calcium (STOOL SOFTENER PO) Take 100 mg by mouth daily.    Marland Kitchen estradiol (ESTRACE) 0.1 MG/GM vaginal cream Place 2 g vaginally 2 (two) times a week.    . methylPREDNISolone (MEDROL DOSEPAK) 4 MG TBPK tablet 6 day dose pack - take as directed 21 tablet 0  . Multiple Vitamin (MULTIVITAMIN) tablet Take 1 tablet by mouth daily.    Marland Kitchen OVER THE COUNTER MEDICATION      No current facility-administered medications for this visit.    No Known Allergies Family History  Problem Relation Age of Onset  . Heart disease Mother   . Heart disease Father   . Cancer Sister        endometrial   PE: BP 108/70   Pulse 85   Ht 4' 9.68" (1.465 m) Comment: measured without shoes  Wt 130 lb (59 kg)   SpO2 97%   BMI 27.48 kg/m  Wt Readings from Last 3 Encounters:  08/02/18 130 lb (59 kg)  08/02/17 129 lb (58.5 kg)  08/02/16 137 lb (62.1 kg)   Constitutional: normal weight, in NAD Eyes: PERRLA, EOMI, no exophthalmos ENT: moist mucous membranes, no thyromegaly, no cervical lymphadenopathy Cardiovascular: RRR, No MRG Respiratory: CTA B Gastrointestinal: abdomen soft, NT, ND, BS+ Musculoskeletal: no deformities, strength intact in all 4 Skin: moist, warm, no rashes Neurological: no tremor with outstretched hands, DTR not checked  2/2 B TKR's  Assessment: 1. Osteoporosis  Plan: 1. Osteoporosis -Likely age-related + postmenopausal -We reviewed her latest DXA scan reports together.  Previous studies were checked on different machines so they were not directly comparable.  Latest bone density scan shows improvement or stability of all T-scores.  Therefore, we will continue Prolia. -She is on a good dose of diet plus supplement of calcium and vitamin D.  She does get approximately 1000 to 1200 mg of calcium daily and enough vitamin D per latest vitamin D level which was normal.  We will repeat the level today. -We again discussed fall precautions -Continue weightbearing exercises-discussed about the involving as many muscle groups as possible -She does not appear to have any side effects from Prolia: No jaw pain, no hip/thigh pain -She  had 4 injections of Prolia.  We can continue for at least 6 years on the medication -We will need another bone density scan in 2 years from the previous, on the same machine.  We again discussed that unchanged or slightly higher T-scores are desirable, but the most important outcome is her not having a fracture -At today's visit we will check a vitamin D and a BMP -I will see her back in a year.  - time spent with the patient: 25 minutes, of which >50% was spent in obtaining information about her symptoms, reviewing her previous labs, evaluations, and treatments, counseling her about her condition (please see the discussed topics above), and developing a plan to further investigate and treat it; she had a number of questions which I addressed.  Office Visit on 08/02/2018  Component Date Value Ref Range Status  . VITD 08/02/2018 47.97  30.00 - 100.00 ng/mL Final  . Glucose, Bld 08/02/2018 93  65 - 99 mg/dL Final   Comment: .            Fasting reference interval .   . BUN 08/02/2018 27* 7 - 25 mg/dL Final  . Creat 08/02/2018 0.69  0.60 - 0.88 mg/dL Final   Comment: For patients >58  years of age, the reference limit for Creatinine is approximately 13% higher for people identified as African-American. .   . GFR, Est Non African American 08/02/2018 82  > OR = 60 mL/min/1.16m Final  . GFR, Est African American 08/02/2018 95  > OR = 60 mL/min/1.736mFinal  . BUN/Creatinine Ratio 08/02/2018 39* 6 - 22 (calc) Final  . Sodium 08/02/2018 139  135 - 146 mmol/L Final  . Potassium 08/02/2018 4.6  3.5 - 5.3 mmol/L Final  . Chloride 08/02/2018 103  98 - 110 mmol/L Final  . CO2 08/02/2018 28  20 - 32 mmol/L Final  . Calcium 08/02/2018 9.6  8.6 - 10.4 mg/dL Final   Normal labs.  CrPhilemon KingdomMD PhD [facility]

## 2018-08-02 NOTE — Patient Instructions (Signed)
Please stop at the lab.  Please return in 1 year.  Bone density scan tests: Date L1-L4 T score FN T score 33% distal Radius UD radius  06/22/2018 ([facility]) n/a RFN: -1.4 LFN: -1.4 -3.1 -2.2  05/10/2016 (Lunar) n/a RFN: -2.0 LFN: -1.5 -3.1 -2.8  05/09/2014 (Hologic) n/a RFN: n/a LFN: -1.7 -2.6 -1.3  04/05/2012 (Lunar) n/a RFN: -1.7 -2.2   11/02/2006 (Lunar) n/a RFN: -1.6 -2.1   10/04/2001 (Hologic)  -0.5 LFN: -0.8 n/a

## 2018-08-03 ENCOUNTER — Encounter: Payer: Self-pay | Admitting: Internal Medicine

## 2018-08-24 ENCOUNTER — Ambulatory Visit: Payer: Medicare Other | Admitting: Podiatry

## 2018-09-03 IMAGING — US US EXTREM LOW VENOUS BILAT
1 series · 13 of 24 positions shown · non-contrast
Comparison: None.

CLINICAL DATA: 77-year-old female with bilateral lower extremity
edema for the past few months.



[Series 1: us extrem low venous bilat · 0.08mm/px · 13 of 68 slices shown]
[im 1/68]
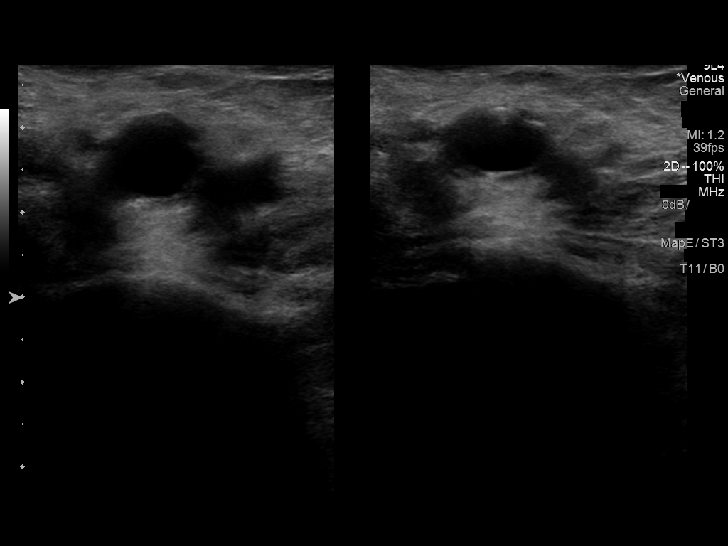
[im 6/68]
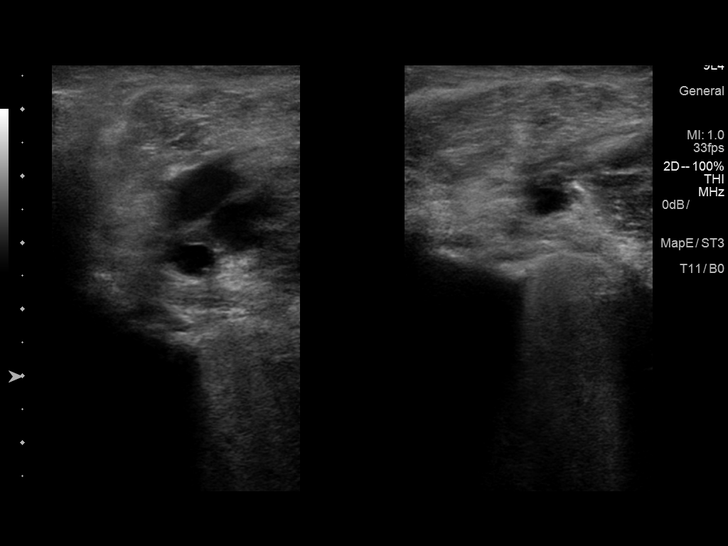
[im 12/68]
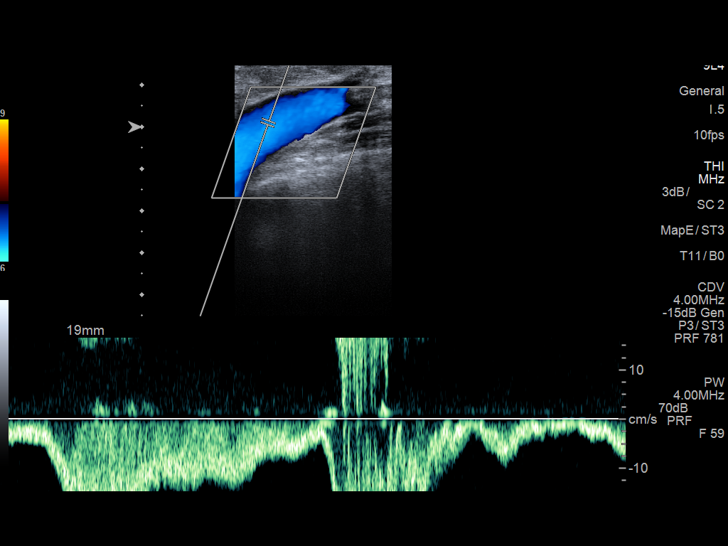
[im 18/68]
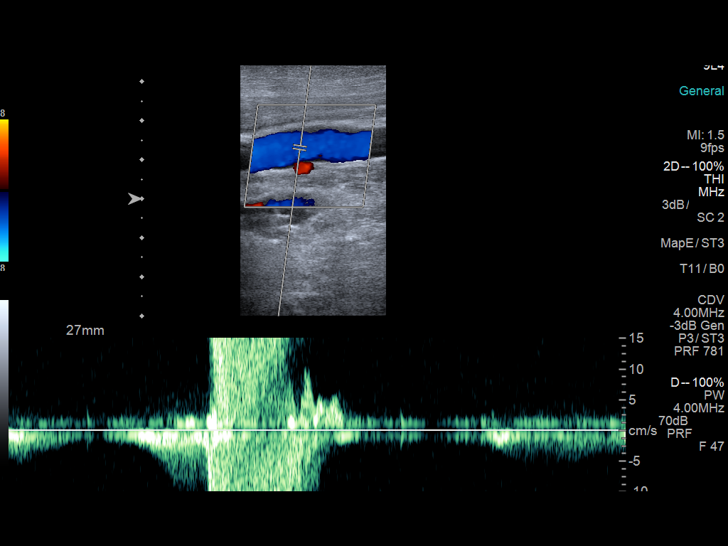
[im 24/68]
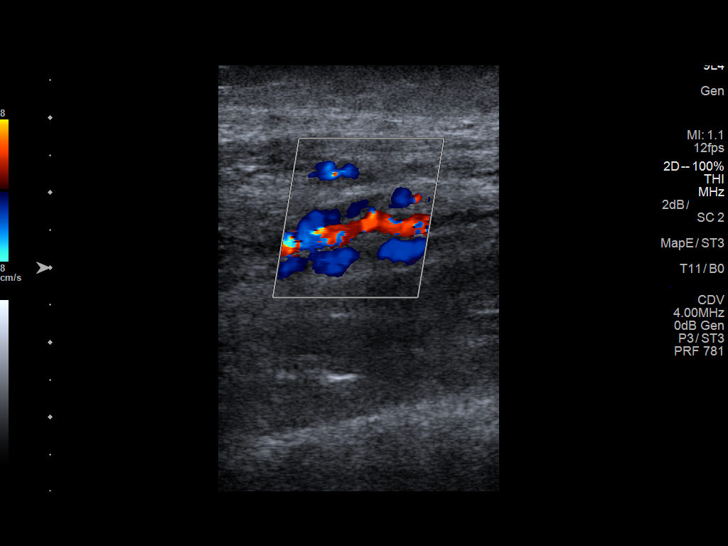
[im 30/68]
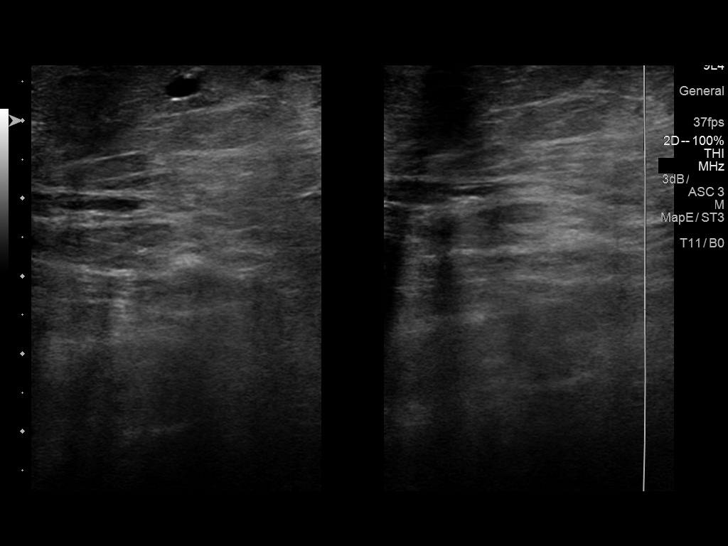
[im 35/68]
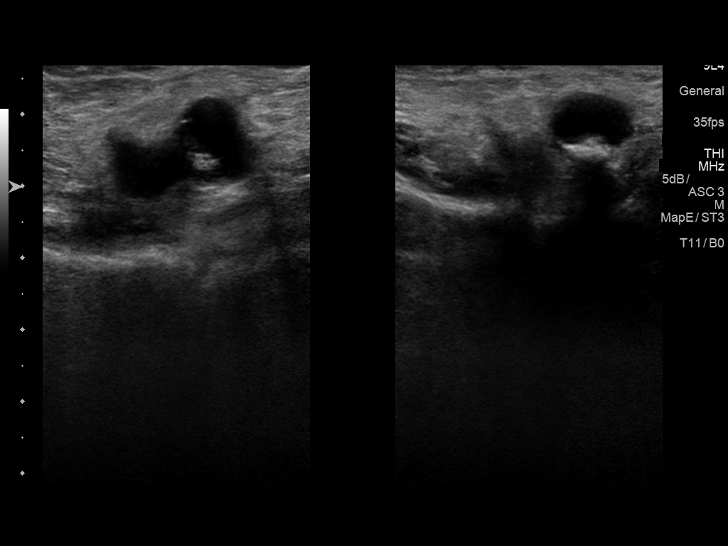
[im 38/68]
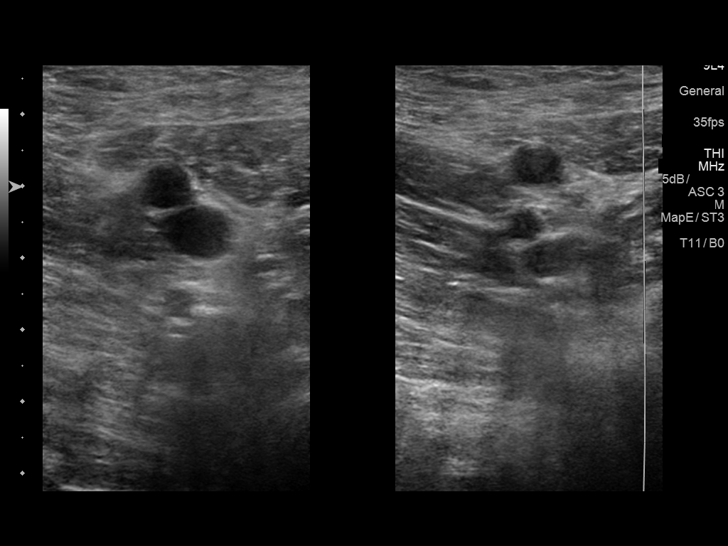
[im 44/68]
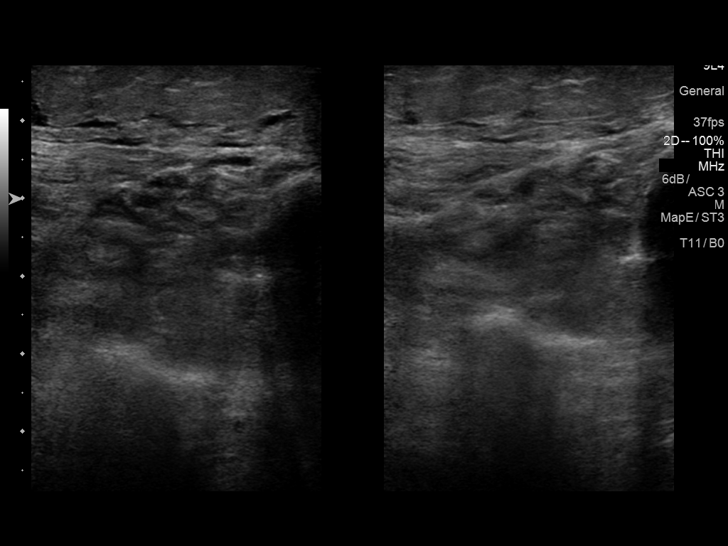
[im 50/68]
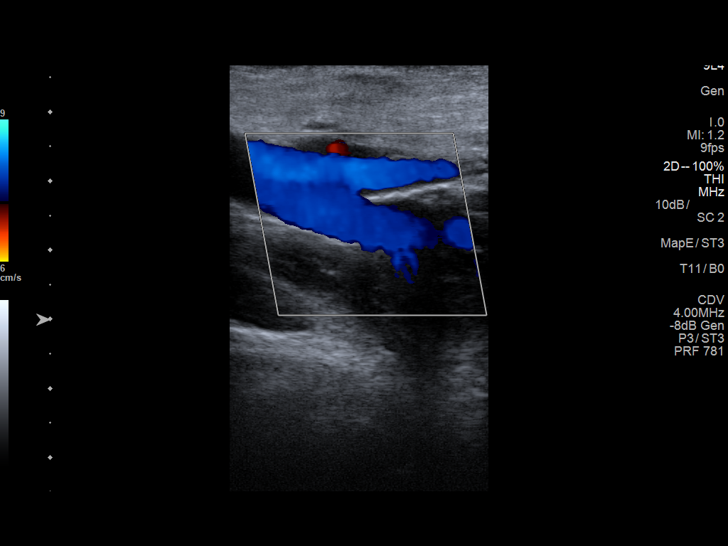
[im 56/68]
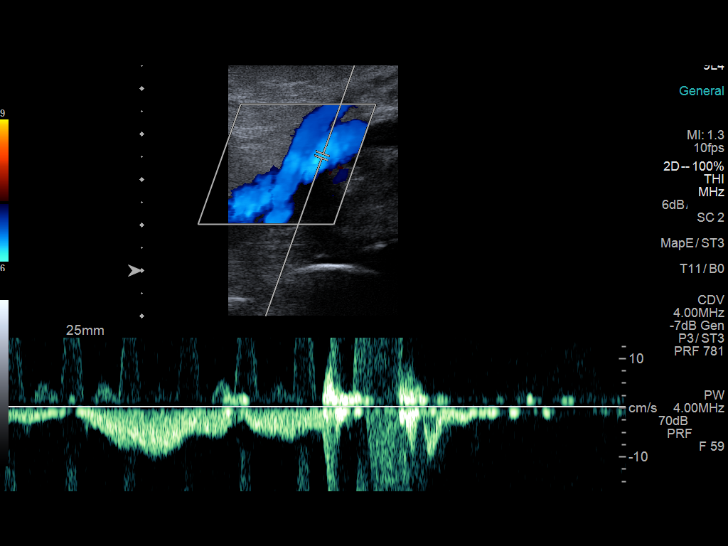
[im 62/68]
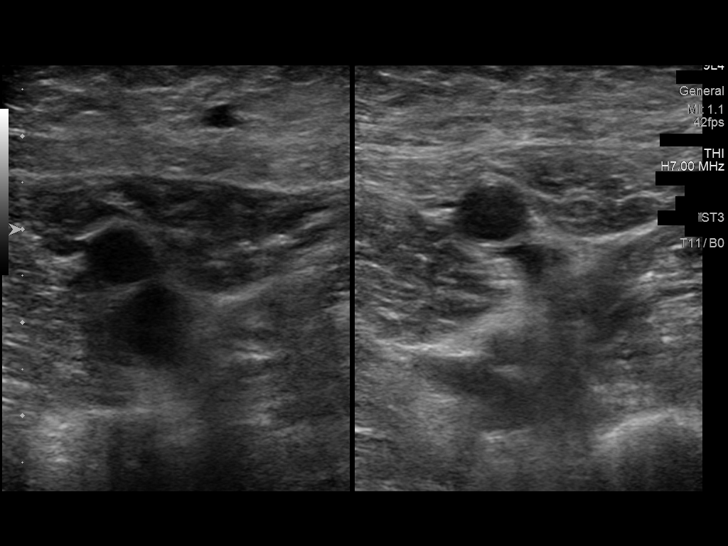
[im 68/68]
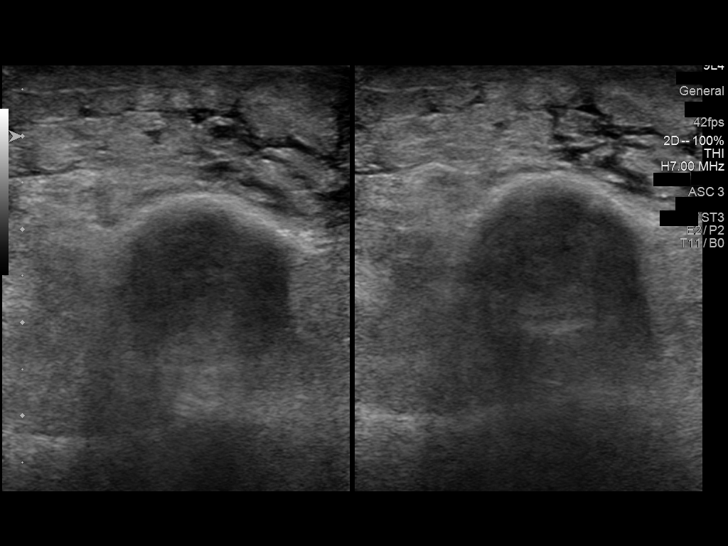

[13 of 24 positions shown; findings below may reference images not displayed]

FINDINGS: RIGHT LOWER EXTREMITY

Common Femoral Vein: No evidence of thrombus. Normal
compressibility, respiratory phasicity and response to augmentation.

Saphenofemoral Junction: No evidence of thrombus. Normal
compressibility and flow on color Doppler imaging.

Profunda Femoral Vein: No evidence of thrombus. Normal
compressibility and flow on color Doppler imaging.

Femoral Vein: No evidence of thrombus. Normal compressibility,
respiratory phasicity and response to augmentation.

Popliteal Vein: No evidence of thrombus. Normal compressibility,
respiratory phasicity and response to augmentation.

Calf Veins: No evidence of thrombus. Normal compressibility and flow
on color Doppler imaging.

Superficial Great Saphenous Vein: No evidence of thrombus. Normal
compressibility.

Venous Reflux:  None.

Other Findings:  Superficial subcutaneous edema in the lower leg.

LEFT LOWER EXTREMITY

Common Femoral Vein: No evidence of thrombus. Normal
compressibility, respiratory phasicity and response to augmentation.

Saphenofemoral Junction: No evidence of thrombus. Normal
compressibility and flow on color Doppler imaging.

Profunda Femoral Vein: No evidence of thrombus. Normal
compressibility and flow on color Doppler imaging.

Femoral Vein: No evidence of thrombus. Normal compressibility,
respiratory phasicity and response to augmentation.

Popliteal Vein: No evidence of thrombus. Normal compressibility,
respiratory phasicity and response to augmentation.

Calf Veins: No evidence of thrombus. Normal compressibility and flow
on color Doppler imaging.

Superficial Great Saphenous Vein: No evidence of thrombus. Normal
compressibility.

Venous Reflux:  None.

Other Findings:  Superficial subcutaneous edema in the lower leg.
IMPRESSION: 1. No evidence of deep venous thrombosis in either lower extremity.
2. Bilateral lower leg superficial edema.

## 2018-09-05 ENCOUNTER — Ambulatory Visit (INDEPENDENT_AMBULATORY_CARE_PROVIDER_SITE_OTHER): Payer: Medicare Other | Admitting: Podiatry

## 2018-09-05 ENCOUNTER — Encounter: Payer: Self-pay | Admitting: Podiatry

## 2018-09-05 DIAGNOSIS — B351 Tinea unguium: Secondary | ICD-10-CM

## 2018-09-05 DIAGNOSIS — M7671 Peroneal tendinitis, right leg: Secondary | ICD-10-CM

## 2018-09-05 DIAGNOSIS — M79676 Pain in unspecified toe(s): Secondary | ICD-10-CM

## 2018-09-06 NOTE — Progress Notes (Signed)
She presents today for follow-up of her plantar fasciitis and peroneal tendinitis states that I am doing much better than it was.  She has not been to cardiology yet.  She has been to her primary care doctor who told her to simply get a pair of compression hose.  Objective: Vital signs are stable alert and oriented x3.  Pulses are palpable.  Still has considerable edema to the bilateral lower extremity left greater than right.  No reproducible pain.  Toenails are long thick yellow dystrophic clinically mycotic painful palpation as well as debridement.  Assessment: Resolving peroneal tendinitis and plantar fasciitis.  Pain in limb secondary to onychomycosis.  Plan: Discussed etiology pathology conservative surgical therapies at this point time recommended that she follow-up with cardiology with a history of heart disease in her family.  Also recommended compression hose.

## 2018-09-11 ENCOUNTER — Telehealth: Payer: Self-pay | Admitting: Internal Medicine

## 2018-09-11 NOTE — Telephone Encounter (Signed)
Patient is due for her prolia shot and has not heard back from our office.  Stated that she called a couple weeks ago and the message never got back to her.   Please advise

## 2018-09-12 NOTE — Telephone Encounter (Signed)
Left VM requesting patient call back to schedule a nurse visit for Prolia-patient can be scheduled after 09/13/18 and she owes $0 at check-in

## 2018-09-13 ENCOUNTER — Other Ambulatory Visit: Payer: Self-pay

## 2018-09-13 ENCOUNTER — Ambulatory Visit (INDEPENDENT_AMBULATORY_CARE_PROVIDER_SITE_OTHER): Payer: Medicare Other

## 2018-09-13 ENCOUNTER — Ambulatory Visit: Payer: Medicare Other

## 2018-09-13 DIAGNOSIS — M810 Age-related osteoporosis without current pathological fracture: Secondary | ICD-10-CM | POA: Diagnosis not present

## 2018-09-13 MED ORDER — DENOSUMAB 60 MG/ML ~~LOC~~ SOSY
60.0000 mg | PREFILLED_SYRINGE | Freq: Once | SUBCUTANEOUS | Status: AC
  Administered 2018-09-13: 60 mg via SUBCUTANEOUS

## 2018-09-13 NOTE — Progress Notes (Signed)
Per orders of Dr. Gherghe injection of Prolia given today by Melissa, Certified Medical Assistant . Patient tolerated injection well.  

## 2018-11-23 DIAGNOSIS — M150 Primary generalized (osteo)arthritis: Secondary | ICD-10-CM | POA: Diagnosis not present

## 2018-11-23 DIAGNOSIS — M2550 Pain in unspecified joint: Secondary | ICD-10-CM | POA: Diagnosis not present

## 2018-11-23 DIAGNOSIS — R768 Other specified abnormal immunological findings in serum: Secondary | ICD-10-CM | POA: Diagnosis not present

## 2018-11-23 DIAGNOSIS — M810 Age-related osteoporosis without current pathological fracture: Secondary | ICD-10-CM | POA: Diagnosis not present

## 2019-01-15 ENCOUNTER — Other Ambulatory Visit: Payer: Self-pay | Admitting: Internal Medicine

## 2019-01-15 DIAGNOSIS — Z1231 Encounter for screening mammogram for malignant neoplasm of breast: Secondary | ICD-10-CM

## 2019-02-27 ENCOUNTER — Ambulatory Visit: Payer: Medicare Other

## 2019-02-28 ENCOUNTER — Ambulatory Visit: Payer: Medicare Other

## 2019-02-28 DIAGNOSIS — Z4689 Encounter for fitting and adjustment of other specified devices: Secondary | ICD-10-CM | POA: Diagnosis not present

## 2019-03-02 ENCOUNTER — Ambulatory Visit
Admission: RE | Admit: 2019-03-02 | Discharge: 2019-03-02 | Disposition: A | Discharge status: Home / Self Care | Payer: Medicare Other | Source: Ambulatory Visit | Attending: Internal Medicine | Admitting: Internal Medicine

## 2019-03-02 ENCOUNTER — Other Ambulatory Visit: Payer: Self-pay

## 2019-03-02 DIAGNOSIS — Z1231 Encounter for screening mammogram for malignant neoplasm of breast: Secondary | ICD-10-CM | POA: Diagnosis not present

## 2019-03-05 ENCOUNTER — Telehealth: Payer: Self-pay | Admitting: Internal Medicine

## 2019-03-05 NOTE — Telephone Encounter (Signed)
Patient called wanting to schedule her prolia. She would like to know when she should be able to schedule. Patient stated she will be calling back if she does not here from anyone in the next couple days

## 2019-03-06 ENCOUNTER — Ambulatory Visit (INDEPENDENT_AMBULATORY_CARE_PROVIDER_SITE_OTHER): Payer: Medicare Other | Admitting: Podiatry

## 2019-03-06 ENCOUNTER — Other Ambulatory Visit: Payer: Self-pay

## 2019-03-06 ENCOUNTER — Encounter: Payer: Self-pay | Admitting: Podiatry

## 2019-03-06 DIAGNOSIS — M79676 Pain in unspecified toe(s): Secondary | ICD-10-CM

## 2019-03-06 DIAGNOSIS — B351 Tinea unguium: Secondary | ICD-10-CM

## 2019-03-06 NOTE — Progress Notes (Signed)
She presents today for chief complaint of painfully elongated toenails.  Objective: Toenails are long thick yellow dystrophic-like mycotic painful palpation.  Assessment: Pain in limb secondary to onychomycosis.  Plan: Debridement of toenails 1 through 5 bilateral covered service secondary to pain.

## 2019-03-08 ENCOUNTER — Telehealth: Payer: Self-pay | Admitting: Internal Medicine

## 2019-03-08 NOTE — Telephone Encounter (Signed)
Patient requests to be called at ph# [phone] to schedule Prolia injection. Patient will not be available between 1:00 and 1:30 today.

## 2019-03-13 DIAGNOSIS — M545 Low back pain: Secondary | ICD-10-CM | POA: Diagnosis not present

## 2019-03-13 DIAGNOSIS — M4156 Other secondary scoliosis, lumbar region: Secondary | ICD-10-CM | POA: Diagnosis not present

## 2019-03-13 DIAGNOSIS — M25551 Pain in right hip: Secondary | ICD-10-CM | POA: Diagnosis not present

## 2019-03-15 NOTE — Telephone Encounter (Signed)
Patient is scheduled on 03/20/19 to get Prolia-she was submitted to the portal again today due to an error the first time she was submitted- [employer] will send Summary of Benefits back with out-of-pocket cost

## 2019-03-19 DIAGNOSIS — M545 Low back pain: Secondary | ICD-10-CM | POA: Diagnosis not present

## 2019-03-19 DIAGNOSIS — R768 Other specified abnormal immunological findings in serum: Secondary | ICD-10-CM | POA: Diagnosis not present

## 2019-03-19 DIAGNOSIS — E611 Iron deficiency: Secondary | ICD-10-CM | POA: Diagnosis not present

## 2019-03-19 DIAGNOSIS — M810 Age-related osteoporosis without current pathological fracture: Secondary | ICD-10-CM | POA: Diagnosis not present

## 2019-03-19 DIAGNOSIS — Z6826 Body mass index (BMI) 26.0-26.9, adult: Secondary | ICD-10-CM | POA: Diagnosis not present

## 2019-03-19 DIAGNOSIS — E663 Overweight: Secondary | ICD-10-CM | POA: Diagnosis not present

## 2019-03-19 DIAGNOSIS — M150 Primary generalized (osteo)arthritis: Secondary | ICD-10-CM | POA: Diagnosis not present

## 2019-03-19 DIAGNOSIS — M2550 Pain in unspecified joint: Secondary | ICD-10-CM | POA: Diagnosis not present

## 2019-03-20 ENCOUNTER — Ambulatory Visit (INDEPENDENT_AMBULATORY_CARE_PROVIDER_SITE_OTHER): Payer: Medicare Other

## 2019-03-20 ENCOUNTER — Other Ambulatory Visit: Payer: Self-pay

## 2019-03-20 DIAGNOSIS — M810 Age-related osteoporosis without current pathological fracture: Secondary | ICD-10-CM | POA: Diagnosis not present

## 2019-03-20 MED ORDER — DENOSUMAB 60 MG/ML ~~LOC~~ SOSY
60.0000 mg | PREFILLED_SYRINGE | Freq: Once | SUBCUTANEOUS | Status: AC
  Administered 2019-03-20: 60 mg via SUBCUTANEOUS

## 2019-03-20 NOTE — Patient Instructions (Signed)
Health Maintenance Due  Topic Date Due  . TETANUS/TDAP  07/30/1957  . PNA vac Low Risk Adult (1 of 2 - PCV13) 07/31/2003  . INFLUENZA VACCINE  02/03/2019    No flowsheet data found.

## 2019-03-20 NOTE — Telephone Encounter (Signed)
Patient was submitted twice but there is an error in the portal-will be speaking with Abigail Cohen today at 2 to discuss

## 2019-03-20 NOTE — Progress Notes (Signed)
  Per orders of Dr. Cruzita Lederer , injection of Prolia 60MG  given in Lt upper arm by Brendell Tyus. Patient tolerated injection well.

## 2019-04-19 DIAGNOSIS — Z23 Encounter for immunization: Secondary | ICD-10-CM | POA: Diagnosis not present

## 2019-05-22 DIAGNOSIS — M545 Low back pain: Secondary | ICD-10-CM | POA: Diagnosis not present

## 2019-05-22 DIAGNOSIS — M5441 Lumbago with sciatica, right side: Secondary | ICD-10-CM | POA: Diagnosis not present

## 2019-05-28 DIAGNOSIS — M545 Low back pain: Secondary | ICD-10-CM | POA: Diagnosis not present

## 2019-06-07 DIAGNOSIS — M5441 Lumbago with sciatica, right side: Secondary | ICD-10-CM | POA: Diagnosis not present

## 2019-06-07 DIAGNOSIS — M545 Low back pain: Secondary | ICD-10-CM | POA: Diagnosis not present

## 2019-06-08 DIAGNOSIS — M47816 Spondylosis without myelopathy or radiculopathy, lumbar region: Secondary | ICD-10-CM | POA: Diagnosis not present

## 2019-06-11 DIAGNOSIS — R030 Elevated blood-pressure reading, without diagnosis of hypertension: Secondary | ICD-10-CM | POA: Diagnosis not present

## 2019-06-11 DIAGNOSIS — M5136 Other intervertebral disc degeneration, lumbar region: Secondary | ICD-10-CM | POA: Diagnosis not present

## 2019-06-11 DIAGNOSIS — I714 Abdominal aortic aneurysm, without rupture: Secondary | ICD-10-CM | POA: Diagnosis not present

## 2019-06-12 ENCOUNTER — Other Ambulatory Visit: Payer: Self-pay | Admitting: Internal Medicine

## 2019-06-12 ENCOUNTER — Ambulatory Visit
Admission: RE | Admit: 2019-06-12 | Discharge: 2019-06-12 | Disposition: A | Discharge status: Home / Self Care | Payer: Medicare Other | Source: Ambulatory Visit | Attending: Internal Medicine | Admitting: Internal Medicine

## 2019-06-12 DIAGNOSIS — I714 Abdominal aortic aneurysm, without rupture, unspecified: Secondary | ICD-10-CM

## 2019-06-12 DIAGNOSIS — N83202 Unspecified ovarian cyst, left side: Secondary | ICD-10-CM | POA: Diagnosis not present

## 2019-06-12 MED ORDER — IOPAMIDOL (ISOVUE-300) INJECTION 61%
100.0000 mL | Freq: Once | INTRAVENOUS | Status: AC | PRN
  Administered 2019-06-12: 100 mL via INTRAVENOUS

## 2019-06-19 DIAGNOSIS — M47816 Spondylosis without myelopathy or radiculopathy, lumbar region: Secondary | ICD-10-CM | POA: Diagnosis not present

## 2019-06-27 ENCOUNTER — Other Ambulatory Visit: Payer: Self-pay | Admitting: Vascular Surgery

## 2019-06-27 ENCOUNTER — Other Ambulatory Visit: Payer: Self-pay

## 2019-06-27 ENCOUNTER — Encounter: Payer: Self-pay | Admitting: Vascular Surgery

## 2019-06-27 ENCOUNTER — Ambulatory Visit (INDEPENDENT_AMBULATORY_CARE_PROVIDER_SITE_OTHER): Payer: Medicare Other | Admitting: Vascular Surgery

## 2019-06-27 VITALS — BP 125/83 | HR 82 | Temp 97.7°F | Resp 20 | Ht <= 58 in | Wt 124.0 lb

## 2019-06-27 DIAGNOSIS — I714 Abdominal aortic aneurysm, without rupture, unspecified: Secondary | ICD-10-CM

## 2019-06-27 NOTE — Progress Notes (Signed)
REASON FOR CONSULT:    Abdominal aortic aneurysm.  The consult is requested by Dr. Deforest Hoyles.  ASSESSMENT & PLAN:   ABDOMINAL AORTIC ANEURYSM: This patient has a 5.6 cm asymptomatic infrarenal abdominal aortic aneurysm.  Based on the current CT scan which was done with 5 mm cuts, she is not appear to be a candidate for a standard endovascular repair.  There is significant tortuosity at the neck and there is not an adequate seal zone.  For this reason I think she would likely require an open repair which would be associated with increased risk given her age.  Alternatively she might be a candidate for a fenestrated graft.  I have recommended that we obtain a CT angiogram of the chest abdomen and pelvis with 1 mm cuts to determine if she might be a candidate for a fenestrated graft.  She is anxious to get the aneurysm repair as soon as possible and therefore we will proceed with cardiac evaluation and a CT as described above.  Once we have this information we can make further recommendations.  I did explain that I did not think it was unreasonable to do a follow-up study in 6 months given that she is 78 and will likely require open repair which would be associated with significant risk and given that the aneurysm is 5.6 cm.  I have discussed this with the patient and her daughter over the phone.  All the questions were answered and will make further recommendations pending results of her cardiac work-up and follow-up CT scan.  [name], MD Office: [phone]   HPI:   Abigail Cohen is a pleasant 78 y.o. female, who was being worked up for back pain.  She has significant arthritis involving her back and multiple joints.  An incidental finding was an abdominal aortic aneurysm.  This prompted a CT scan which showed a 5.7 cm infrarenal abdominal aortic aneurysm.  She was referred for vascular consultation.  She denies any family history of aneurysmal disease.  She has chronic back pain related to  her arthritis.  She denies any abdominal pain.  She has no significant cardiac history.  She denies any history of myocardial infarction, congestive heart failure, chest pain or dyspnea on exertion.  She is able to walk up stairs without becoming short of breath.  She is had a previous hernia repair in her left lower quadrant.  She has had no other abdominal surgery.  She quit smoking in approximately 2005.  She denies any history of claudication, rest pain, or nonhealing ulcers.  She lives independently at home.  She is fairly active.   Past Medical History:  Diagnosis Date  . Borderline hypertension   . Family history of anesthesia complication    daughter gets nauseated  . Knee joint replacement status   . Osteoarthritis   . Osteopenia   . Uterine prolapse   . Wears glasses   . Wears partial dentures    upper and lower partial    Family History  Problem Relation Age of Onset  . Heart disease Mother   . Heart disease Father   . Cancer Sister        endometrial    SOCIAL HISTORY: Social History   Socioeconomic History  . Marital status: Divorced    Spouse name: Not on file  . Number of children: Not on file  . Years of education: Not on file  . Highest education level: Not on file  Occupational History  .  Not on file  Tobacco Use  . Smoking status: Former Smoker    Start date: 07/05/2004    Quit date: 06/30/2005    Years since quitting: 14.0  . Smokeless tobacco: Never Used  Substance and Sexual Activity  . Alcohol use: Yes    Comment: social  . Drug use: No  . Sexual activity: Not on file  Other Topics Concern  . Not on file  Social History Narrative  . Not on file   Social Determinants of Health   Financial Resource Strain:   . Difficulty of Paying Living Expenses: Not on file  Food Insecurity:   . Worried About Charity fundraiser in the Last Year: Not on file  . Ran Out of Food in the Last Year: Not on file  Transportation Needs:   . Lack of  Transportation (Medical): Not on file  . Lack of Transportation (Non-Medical): Not on file  Physical Activity:   . Days of Exercise per Week: Not on file  . Minutes of Exercise per Session: Not on file  Stress:   . Feeling of Stress : Not on file  Social Connections:   . Frequency of Communication with Friends and Family: Not on file  . Frequency of Social Gatherings with Friends and Family: Not on file  . Attends Religious Services: Not on file  . Active Member of Clubs or Organizations: Not on file  . Attends Archivist Meetings: Not on file  . Marital Status: Not on file  Intimate Partner Violence:   . Fear of Current or Ex-Partner: Not on file  . Emotionally Abused: Not on file  . Physically Abused: Not on file  . Sexually Abused: Not on file    No Known Allergies  Current Outpatient Medications  Medication Sig Dispense Refill  . Benfotiamine 150 MG CAPS Take 300 mg by mouth.    Marland Kitchen CALCIUM-VITAMIN D PO Take 1,500 mg by mouth daily.    Marland Kitchen denosumab (PROLIA) 60 MG/ML SOSY injection Inject 60 mg into the skin every 6 (six) months.    Mariane Baumgarten Calcium (STOOL SOFTENER PO) Take 100 mg by mouth daily.    Marland Kitchen estradiol (ESTRACE) 0.1 MG/GM vaginal cream Place 2 g vaginally 2 (two) times a week.    . metoprolol succinate (TOPROL-XL) 25 MG 24 hr tablet Take 25 mg by mouth daily.    . Multiple Vitamin (MULTIVITAMIN) tablet Take 1 tablet by mouth daily.    Marland Kitchen OVER THE COUNTER MEDICATION     . tiZANidine (ZANAFLEX) 2 MG tablet SMARTSIG:1 Tablet(s) By Mouth Every 12 Hours PRN     No current facility-administered medications for this visit.    REVIEW OF SYSTEMS:  [X]  denotes positive finding, [ ]  denotes negative finding Cardiac  Comments:  Chest pain or chest pressure:    Shortness of breath upon exertion:    Short of breath when lying flat:    Irregular heart rhythm:        Vascular    Pain in calf, thigh, or hip brought on by ambulation:    Pain in feet at night that  wakes you up from your sleep:     Blood clot in your veins:    Leg swelling:  x       Pulmonary    Oxygen at home:    Productive cough:     Wheezing:         Neurologic    Sudden weakness in arms or legs:  Sudden numbness in arms or legs:     Sudden onset of difficulty speaking or slurred speech:    Temporary loss of vision in one eye:     Problems with dizziness:         Gastrointestinal    Blood in stool:     Vomited blood:         Genitourinary    Burning when urinating:     Blood in urine:        Psychiatric    Major depression:         Hematologic    Bleeding problems:    Problems with blood clotting too easily:        Skin    Rashes or ulcers:        Constitutional    Fever or chills:     PHYSICAL EXAM:   Vitals:   06/27/19 0950  BP: 125/83  Pulse: 82  Resp: 20  Temp: 97.7 F (36.5 C)  SpO2: 98%  Weight: 124 lb (56.2 kg)  Height: 4\' 9"  (1.448 m)    GENERAL: The patient is a well-nourished female, in no acute distress. The vital signs are documented above. CARDIAC: There is a regular rate and rhythm.  VASCULAR: I do not detect carotid bruits. She has palpable femoral pulses. She has palpable dorsalis pedis pulses bilaterally. She has no significant lower extremity swelling. PULMONARY: There is good air exchange bilaterally without wheezing or rales. ABDOMEN: Soft and non-tender with normal pitched bowel sounds.  Her aneurysm is palpable and nontender. She has a ventral hernia. MUSCULOSKELETAL: There are no major deformities or cyanosis. NEUROLOGIC: No focal weakness or paresthesias are detected. SKIN: There are no ulcers or rashes noted. PSYCHIATRIC: The patient has a normal affect.  DATA:    LABS: I did find some labs from 06/11/2019 which show a GFR of greater than 60.  Creatinine is 0.6.  CT ABDOMEN PELVIS: I reviewed her CT abdomen pelvis that was done on 06/12/2019.  This was done with 5 mm cuts.  By my measurement the aneurysm is  approximately 5.6 cm in maximum diameter.  There was significant tortuosity involving the infrarenal aorta, juxtarenal aorta.  For this reason based on this CT scan she does not appear to be a candidate for a standard stent graft repair.  The right common iliac artery is ectatic at 2.5 cm.

## 2019-07-02 ENCOUNTER — Other Ambulatory Visit: Payer: Self-pay

## 2019-07-02 ENCOUNTER — Ambulatory Visit (HOSPITAL_COMMUNITY)
Admission: RE | Admit: 2019-07-02 | Discharge: 2019-07-02 | Disposition: A | Discharge status: Home / Self Care | Payer: Medicare Other | Source: Ambulatory Visit | Attending: Vascular Surgery | Admitting: Vascular Surgery

## 2019-07-02 DIAGNOSIS — I714 Abdominal aortic aneurysm, without rupture, unspecified: Secondary | ICD-10-CM

## 2019-07-02 MED ORDER — SODIUM CHLORIDE (PF) 0.9 % IJ SOLN
INTRAMUSCULAR | Status: AC
  Filled 2019-07-02: qty 100

## 2019-07-02 MED ORDER — IOHEXOL 350 MG/ML SOLN
100.0000 mL | Freq: Once | INTRAVENOUS | Status: AC | PRN
  Administered 2019-07-02: 100 mL via INTRAVENOUS

## 2019-07-04 ENCOUNTER — Encounter: Payer: Medicare Other | Admitting: Vascular Surgery

## 2019-07-11 DIAGNOSIS — R7309 Other abnormal glucose: Secondary | ICD-10-CM | POA: Diagnosis not present

## 2019-07-11 DIAGNOSIS — D509 Iron deficiency anemia, unspecified: Secondary | ICD-10-CM | POA: Diagnosis not present

## 2019-07-11 DIAGNOSIS — I10 Essential (primary) hypertension: Secondary | ICD-10-CM | POA: Diagnosis not present

## 2019-07-11 DIAGNOSIS — Z0000 Encounter for general adult medical examination without abnormal findings: Secondary | ICD-10-CM | POA: Diagnosis not present

## 2019-07-11 DIAGNOSIS — M810 Age-related osteoporosis without current pathological fracture: Secondary | ICD-10-CM | POA: Diagnosis not present

## 2019-07-11 DIAGNOSIS — Z1389 Encounter for screening for other disorder: Secondary | ICD-10-CM | POA: Diagnosis not present

## 2019-07-11 DIAGNOSIS — I714 Abdominal aortic aneurysm, without rupture: Secondary | ICD-10-CM | POA: Diagnosis not present

## 2019-07-11 NOTE — Progress Notes (Signed)
Cardiology Office Note:   Date:  07/12/2019  NAME:  Abigail Cohen    MRN: [id number] DOB:  February 12, 1941   PCP:  Wenda Low, MD  Cardiologist:  Evalina Field, MD   Referring MD: Wenda Low, MD   Chief Complaint  Patient presents with  . Pre-op Exam   History of Present Illness:   Abigail Cohen is a 79 y.o. female with a hx of AAA who is being seen today for the evaluation of preoperative assessment at the request of Wenda Low, MD.  She is recently found to have a 5.7 cm infrarenal AAA.  I personally reviewed her CT scan which demonstrated three-vessel coronary calcifications.  She also has calcifications of the aorta.  No prior cardiac history.  No cardiac work-up.  No EKG in system.  She has an extensive smoking history of nearly 35 pack years.  She quit some years ago.  She has no hypertension reported.  Her blood pressure has been up elevated recently due to anxiety and stress over this.  She reports that her AAA was found due to the evaluation of back pain.  She reportedly had back pain on and off for 6 months and this eventually resulted in an MRI that found her abdominal aortic aneurysm.  A recent CT scan by her vascular surgeon shows it is up to 5.7 cm.  He is informed her that this likely will be an open procedure.  He does not feel that this can be treated endovascularly.  She has no history of diabetes and has never had a history of heart attack or stroke.  Review of her cholesterol from 2018 shows an LDL of 88.  She has normal kidney function.  She is not on aspirin or statin.  I also reviewed her CT scan and she has extensive three-vessel coronary calcification.  She has no symptoms of angina.  She reports that she was exercising very regularly until her back pain started 6 months ago.  She reports that she can use an exercise glider but has been uneasy to do this lately.  She does get a little short of breath with minimal activity around the house.  She describes no chest pain  or pressure.  She reports no palpitations.  She does get swelling in her lower extremities and wears compression stockings.  There is no diagnosis of heart failure.  She plans to meet with vascular surgery next week for final surgical recommendations.  Problem List 1. Former tobacco use 2. AAA 5.7 cm (07/03/2019) 3. 3v CAD on CT scan (calcium seen)  Past Medical History: Past Medical History:  Diagnosis Date  . AAA (abdominal aortic aneurysm) ([place])   . Borderline hypertension   . Family history of anesthesia complication    daughter gets nauseated  . Hypertension   . Knee joint replacement status   . Osteoarthritis   . Osteopenia   . Uterine prolapse   . Wears glasses   . Wears partial dentures    upper and lower partial    Past Surgical History: Past Surgical History:  Procedure Laterality Date  . COLONOSCOPY    . HERNIA REPAIR  07/07/12   Dr Margot Chimes  . INGUINAL HERNIA REPAIR  07/07/2012   Procedure: HERNIA REPAIR INGUINAL ADULT;  Surgeon: Haywood Lasso, MD;  Location: [facility];  Service: General;  Laterality: Left;  left inguinal area  . REPLACEMENT TOTAL KNEE BILATERAL  2003  . TONSILLECTOMY  1945  .  WISDOM TOOTH EXTRACTION      Current Medications: Current Meds  Medication Sig  . Acetaminophen (TYLENOL ARTHRITIS PAIN PO) Take 500 mg by mouth as directed.  . Benfotiamine 150 MG CAPS Take 300 mg by mouth.  Marland Kitchen CALCIUM-VITAMIN D PO Take 1,500 mg by mouth daily.  Marland Kitchen denosumab (PROLIA) 60 MG/ML SOSY injection Inject 60 mg into the skin every 6 (six) months.  Mariane Baumgarten Calcium (STOOL SOFTENER PO) Take 100 mg by mouth daily.  Marland Kitchen estradiol (ESTRACE) 0.1 MG/GM vaginal cream Place 2 g vaginally 2 (two) times a week.  . Magnesium 250 MG TABS Take 1 tablet by mouth daily.  . metoprolol succinate (TOPROL-XL) 25 MG 24 hr tablet Take 25 mg by mouth daily.  . Multiple Vitamin (MULTIVITAMIN) tablet Take 1 tablet by mouth daily.  Marland Kitchen OVER THE COUNTER MEDICATION   .  Vitamin D, Cholecalciferol, 10 MCG (400 UNIT) CAPS Take 1 capsule by mouth daily.     Allergies:    Patient has no known allergies.   Social History: Social History   Socioeconomic History  . Marital status: Divorced    Spouse name: Not on file  . Number of children: 2  . Years of education: Not on file  . Highest education level: Not on file  Occupational History  . Occupation: retired Arts development officer  Tobacco Use  . Smoking status: Former Smoker    Years: 35.00    Start date: 07/05/2004    Quit date: 06/30/2005    Years since quitting: 14.0  . Smokeless tobacco: Never Used  Substance and Sexual Activity  . Alcohol use: Yes    Comment: social  . Drug use: No  . Sexual activity: Not on file  Other Topics Concern  . Not on file  Social History Narrative  . Not on file   Social Determinants of Health   Financial Resource Strain:   . Difficulty of Paying Living Expenses: Not on file  Food Insecurity:   . Worried About Charity fundraiser in the Last Year: Not on file  . Ran Out of Food in the Last Year: Not on file  Transportation Needs:   . Lack of Transportation (Medical): Not on file  . Lack of Transportation (Non-Medical): Not on file  Physical Activity:   . Days of Exercise per Week: Not on file  . Minutes of Exercise per Session: Not on file  Stress:   . Feeling of Stress : Not on file  Social Connections:   . Frequency of Communication with Friends and Family: Not on file  . Frequency of Social Gatherings with Friends and Family: Not on file  . Attends Religious Services: Not on file  . Active Member of Clubs or Organizations: Not on file  . Attends Archivist Meetings: Not on file  . Marital Status: Not on file     Family History: The patient's family history includes Arrhythmia in her mother; Cancer in her sister; Heart attack in her father and mother; Heart disease in her father and mother.  ROS:   All other ROS reviewed and negative. Pertinent  positives noted in the HPI.     EKGs/Labs/Other Studies Reviewed:   The following studies were personally reviewed by me today:  EKG:  EKG is ordered today.  The ekg ordered today demonstrates normal sinus rhythm, heart rate 88, no acute ST-T changes, no evidence of prior infarction, and was personally reviewed by me.   CT Angio CAP 07/03/2019 Infrarenal abdominal  aortic aneurysm with complex neck anatomy, unchanged from the comparison CT with estimated greatest diameter on coronal imaging, 5.7 cm. Aortic Atherosclerosis (ICD10-I70.0).  Aortic atherosclerosis and coronary artery disease. Aortic Atherosclerosis (ICD10-I70.0).  Emphysema with associated bullous changes and scarring at the lung apices.  Ill-defined nonspecific nodularity at the right lung apex, potentially scarring, with a 5 mm nodule. Non-contrast chest CT can be considered in 12 months given the patient's apparent risk factors this recommendation follows the consensus statement: Guidelines for Management of Incidental Pulmonary Nodules Detected on CT Images: From the Fleischner Society 2017; Radiology 2017; 284:228-243.  The right pelvic cystic lesion identified on prior CT and MRI is unchanged in size, presumably right adnexal cyst, though ultimately the current CT adds nothing further than the prior CT and MR.  Recent Labs: 08/02/2018: BUN 27; Creat 0.69; Potassium 4.6; Sodium 139   Recent Lipid Panel No results found for: CHOL, TRIG, HDL, CHOLHDL, VLDL, LDLCALC, LDLDIRECT  Physical Exam:   VS:  BP (!) 167/99   Pulse 88   Ht 4\' 10"  (1.473 m)   Wt 128 lb 12.8 oz (58.4 kg)   SpO2 96%   BMI 26.92 kg/m    Wt Readings from Last 3 Encounters:  07/12/19 128 lb 12.8 oz (58.4 kg)  06/27/19 124 lb (56.2 kg)  08/02/18 130 lb (59 kg)    General: Well nourished, well developed, in no acute distress Heart: Atraumatic, normal size  Eyes: PEERLA, EOMI  Neck: Supple, no JVD Endocrine: No thryomegaly Cardiac:  Normal S1, S2; RRR; no murmurs, rubs, or gallops Lungs: Clear to auscultation bilaterally, no wheezing, rhonchi or rales  Abd: Soft, enlarged aorta noted on exam Ext: No edema, pulses 2+ Musculoskeletal: No deformities, BUE and BLE strength normal and equal Skin: Warm and dry, no rashes   Neuro: Alert and oriented to person, place, time, and situation, CNII-XII grossly intact, no focal deficits  Psych: Normal mood and affect   ASSESSMENT:   LEXEY CADAVID is a 79 y.o. female who presents for the following: 1. Preoperative cardiovascular examination   2. SOB (shortness of breath)   3. AAA (abdominal aortic aneurysm) without rupture ([place])   4. Coronary artery disease involving native coronary artery of native heart without angina pectoris     PLAN:   1. Preoperative cardiovascular examination 2. SOB (shortness of breath) 3. AAA (abdominal aortic aneurysm) without rupture ([place]) 4. Coronary artery disease involving native coronary artery of native heart without angina pectoris -AAA up to 5.7 cm.  Plans for vascular dimension.  She has three-vessel coronary calcifications on CT scan.  No symptoms of angina.  She has not been that active lately and I think given her high risk procedure we should proceed with a nuclear medicine stress test.  I have also recommended aspirin and statin therapy.  We will start her on a baby aspirin.  10 mg of Crestor is been added.  She just had her cholesterol checked and we will get the results to me. -In the interim I have encouraged her to continue regular light aerobic activity.  I have discouraged heavy lifting or anything that would result her to strain significantly. -We will plan to get the echo and stress test done next week.  She meets with vascular surgery next week regarding timing of surgery.  We will get this information to them as we get it done. -I also counseled her on heart healthy diet as well as the likelihood that this came from years  of smoking.   It is reassuring that her EKG is very normal without acute ischemic changes or evidence of prior infarction.  Disposition: Return in about 2 weeks (around 07/26/2019).  Medication Adjustments/Labs and Tests Ordered: Current medicines are reviewed at length with the patient today.  Concerns regarding medicines are outlined above.  Orders Placed This Encounter  Procedures  . Myocardial Perfusion Imaging  . EKG 12-Lead  . ECHOCARDIOGRAM COMPLETE   Meds ordered this encounter  Medications  . rosuvastatin (CRESTOR) 10 MG tablet    Sig: Take 1 tablet (10 mg total) by mouth daily.    Dispense:  90 tablet    Refill:  3    Patient Instructions  Medication Instructions:  Start Aspirin 81 mg daily Start Crestor 10 mg daily *If you need a refill on your cardiac medications before your next appointment, please call your pharmacy*  Lab Work: None ordered   Testing/Procedures: Schedule Echo  Schedule Lexiscan Myoview  Follow-Up: At [facility], you and your health needs are our priority.  As part of our continuing mission to provide you with exceptional heart care, we have created designated Provider Care Teams.  These Care Teams include your primary Cardiologist (physician) and Advanced Practice Providers (APPs -  Physician Assistants and Nurse Practitioners) who all work together to provide you with the care you need, when you need it.  Your next appointment:  2 weeks   The format for your next appointment:  Virtual   Provider:  Dr.Schumann      Signed, [place] T. Audie Box, [place]  [address] [place], [place] [address] [phone]  07/12/2019 12:14 PM

## 2019-07-12 ENCOUNTER — Ambulatory Visit (INDEPENDENT_AMBULATORY_CARE_PROVIDER_SITE_OTHER): Payer: Medicare Other | Admitting: Cardiovascular Disease

## 2019-07-12 ENCOUNTER — Encounter: Payer: Self-pay | Admitting: Cardiovascular Disease

## 2019-07-12 ENCOUNTER — Other Ambulatory Visit: Payer: Self-pay

## 2019-07-12 ENCOUNTER — Ambulatory Visit (HOSPITAL_COMMUNITY): Payer: Medicare Other | Attending: Cardiology

## 2019-07-12 VITALS — BP 167/99 | HR 88 | Ht <= 58 in | Wt 128.8 lb

## 2019-07-12 DIAGNOSIS — I714 Abdominal aortic aneurysm, without rupture, unspecified: Secondary | ICD-10-CM

## 2019-07-12 DIAGNOSIS — Z01810 Encounter for preprocedural cardiovascular examination: Secondary | ICD-10-CM | POA: Diagnosis not present

## 2019-07-12 DIAGNOSIS — R0602 Shortness of breath: Secondary | ICD-10-CM | POA: Insufficient documentation

## 2019-07-12 DIAGNOSIS — I2510 Atherosclerotic heart disease of native coronary artery without angina pectoris: Secondary | ICD-10-CM

## 2019-07-12 MED ORDER — ROSUVASTATIN CALCIUM 10 MG PO TABS: 10.0000 mg | ORAL_TABLET | Freq: Every day | ORAL | 3 refills | Status: DC

## 2019-07-12 NOTE — Patient Instructions (Signed)
Medication Instructions:  Start Aspirin 81 mg daily Start Crestor 10 mg daily *If you need a refill on your cardiac medications before your next appointment, please call your pharmacy*  Lab Work: None ordered   Testing/Procedures: Schedule Echo  Schedule Lexiscan Myoview  Follow-Up: At [facility], you and your health needs are our priority.  As part of our continuing mission to provide you with exceptional heart care, we have created designated Provider Care Teams.  These Care Teams include your primary Cardiologist (physician) and Advanced Practice Providers (APPs -  Physician Assistants and Nurse Practitioners) who all work together to provide you with the care you need, when you need it.  Your next appointment:  2 weeks   The format for your next appointment:  Virtual   Provider:  Dr.Schumann

## 2019-07-13 ENCOUNTER — Telehealth: Payer: Self-pay | Admitting: Cardiovascular Disease

## 2019-07-13 ENCOUNTER — Ambulatory Visit: Payer: Medicare Other | Admitting: Cardiology

## 2019-07-13 LAB — ECHOCARDIOGRAM COMPLETE
Height: 58 in
Weight: 2060.8 oz

## 2019-07-13 NOTE — Telephone Encounter (Signed)
Attempted to call Mrs. Barrero about normal echo. No answer. -W

## 2019-07-13 NOTE — Telephone Encounter (Signed)
We are recommending the COVID-19 vaccine to all of our patients. Cardiac medications (including blood thinners) should not deter anyone from being vaccinated and there is no need to hold any of those medications prior to vaccine administration.     Currently, there is a hotline to call (active 07/13/19) to schedule vaccination appointments as no walk-ins will be accepted.   Number: [phone]    If you have further questions or concerns about the vaccine process, please visit [email or web address] or contact your primary care physician.

## 2019-07-14 ENCOUNTER — Ambulatory Visit: Payer: Medicare Other | Attending: Internal Medicine

## 2019-07-14 DIAGNOSIS — Z23 Encounter for immunization: Secondary | ICD-10-CM | POA: Insufficient documentation

## 2019-07-14 NOTE — Progress Notes (Signed)
   Covid-19 Vaccination Clinic  Name:  Abigail Cohen    MRN: [id number] DOB: 1941-04-29  07/14/2019  [name] was observed post Covid-19 immunization for 30 minutes based on pre-vaccination screening without incidence. She was provided with Vaccine Information Sheet and instruction to access the V-Safe system.   [name] was instructed to call 911 with any severe reactions post vaccine: Marland Kitchen Difficulty breathing  . Swelling of your face and throat  . A fast heartbeat  . A bad rash all over your body  . Dizziness and weakness    Immunizations Administered    Name Date Dose VIS Date Route   Pfizer COVID-19 Vaccine 07/14/2019 12:34 PM 0.3 mL 06/15/2019 Intramuscular   Manufacturer: [place]   Lot: [id number]   [place]: [id number]

## 2019-07-17 ENCOUNTER — Telehealth (HOSPITAL_COMMUNITY): Payer: Self-pay

## 2019-07-17 NOTE — Telephone Encounter (Signed)

## 2019-07-17 NOTE — Telephone Encounter (Signed)
Encounter complete. 

## 2019-07-18 ENCOUNTER — Ambulatory Visit: Payer: Medicare Other | Admitting: Vascular Surgery

## 2019-07-18 ENCOUNTER — Ambulatory Visit (INDEPENDENT_AMBULATORY_CARE_PROVIDER_SITE_OTHER): Payer: Medicare Other | Admitting: Vascular Surgery

## 2019-07-18 ENCOUNTER — Encounter: Payer: Self-pay | Admitting: Vascular Surgery

## 2019-07-18 ENCOUNTER — Other Ambulatory Visit: Payer: Self-pay

## 2019-07-18 VITALS — BP 142/91 | HR 79 | Temp 97.5°F | Resp 20 | Ht <= 58 in | Wt 130.0 lb

## 2019-07-18 DIAGNOSIS — I2510 Atherosclerotic heart disease of native coronary artery without angina pectoris: Secondary | ICD-10-CM | POA: Diagnosis not present

## 2019-07-18 DIAGNOSIS — I714 Abdominal aortic aneurysm, without rupture, unspecified: Secondary | ICD-10-CM

## 2019-07-18 NOTE — Progress Notes (Signed)
Patient name: Abigail Cohen MRN: [id number] DOB: [birth year] Sex: female  REASON FOR VISIT:   Follow-up of abdominal aortic aneurysm.  HPI:   Abigail Cohen is a pleasant 79 y.o. female who I saw on 06/27/2019 with a 5.6 cm infrarenal abdominal aortic aneurysm.  The CT scan was done with 5 mm cuts and based on that she did not appear to be a candidate for endovascular approach.  However I recommended a CT scan with thin cuts to further assess her options with respect to an endovascular approach whether it be an endovascular aneurysm repair or a fenestrated graft.  She comes in today to discuss those results.  The patient was sent for preoperative cardiac evaluation and was seen by Dr. Farris Has on 07/12/2019.  The patient was scheduled for an echo and a stress test.  She was started on aspirin and a statin.  The stress test is scheduled for tomorrow.  The echo was done on 07/13/2019.  With respect to her past surgical history she has had a previous hernia repair in the left lower quadrant.  She denies any other abdominal surgery.    She denies any history of claudication, rest pain, or nonhealing ulcers.  She lives independently and is fairly active.  Since I saw her last, she denies any abdominal pain or back pain.  Past Medical History:  Diagnosis Date  . AAA (abdominal aortic aneurysm) ([place])   . Borderline hypertension   . Family history of anesthesia complication    daughter gets nauseated  . Hypertension   . Knee joint replacement status   . Osteoarthritis   . Osteopenia   . Uterine prolapse   . Wears glasses   . Wears partial dentures    upper and lower partial    Family History  Problem Relation Age of Onset  . Heart disease Mother   . Heart attack Mother   . Arrhythmia Mother   . Heart disease Father   . Heart attack Father   . Cancer Sister        endometrial    SOCIAL HISTORY: Social History   Tobacco Use  . Smoking status: Former Smoker    Years: 35.00   Start date: 07/05/2004    Quit date: 06/30/2005    Years since quitting: 14.0  . Smokeless tobacco: Never Used  Substance Use Topics  . Alcohol use: Yes    Comment: social    No Known Allergies  Current Outpatient Medications  Medication Sig Dispense Refill  . Acetaminophen (TYLENOL ARTHRITIS PAIN PO) Take 500 mg by mouth as directed.    Marland Kitchen aspirin EC 81 MG tablet Take 1 tablet (81 mg total) by mouth daily.    . Benfotiamine 150 MG CAPS Take 300 mg by mouth.    Marland Kitchen CALCIUM-VITAMIN D PO Take 1,500 mg by mouth daily.    Marland Kitchen denosumab (PROLIA) 60 MG/ML SOSY injection Inject 60 mg into the skin every 6 (six) months.    Mariane Baumgarten Calcium (STOOL SOFTENER PO) Take 100 mg by mouth daily.    Marland Kitchen estradiol (ESTRACE) 0.1 MG/GM vaginal cream Place 2 g vaginally 2 (two) times a week.    . Magnesium 250 MG TABS Take 1 tablet by mouth daily.    . metoprolol succinate (TOPROL-XL) 25 MG 24 hr tablet Take 25 mg by mouth daily.    . Multiple Vitamin (MULTIVITAMIN) tablet Take 1 tablet by mouth daily.    Marland Kitchen OVER THE COUNTER MEDICATION     .  rosuvastatin (CRESTOR) 10 MG tablet Take 1 tablet (10 mg total) by mouth daily. 90 tablet 3  . Vitamin D, Cholecalciferol, 10 MCG (400 UNIT) CAPS Take 1 capsule by mouth daily.     No current facility-administered medications for this visit.    REVIEW OF SYSTEMS:  [X]  denotes positive finding, [ ]  denotes negative finding Cardiac  Comments:  Chest pain or chest pressure:    Shortness of breath upon exertion:    Short of breath when lying flat:    Irregular heart rhythm:        Vascular    Pain in calf, thigh, or hip brought on by ambulation:    Pain in feet at night that wakes you up from your sleep:     Blood clot in your veins:    Leg swelling:  x       Pulmonary    Oxygen at home:    Productive cough:     Wheezing:         Neurologic    Sudden weakness in arms or legs:     Sudden numbness in arms or legs:     Sudden onset of difficulty speaking or  slurred speech:    Temporary loss of vision in one eye:     Problems with dizziness:         Gastrointestinal    Blood in stool:     Vomited blood:         Genitourinary    Burning when urinating:     Blood in urine:        Psychiatric    Major depression:         Hematologic    Bleeding problems:    Problems with blood clotting too easily:        Skin    Rashes or ulcers:        Constitutional    Fever or chills:     PHYSICAL EXAM:   Vitals:   07/18/19 0857  BP: (!) 142/91  Pulse: 79  Resp: 20  Temp: (!) 97.5 F (36.4 C)  SpO2: 98%  Weight: 130 lb (59 kg)  Height: 4\' 10"  (1.473 m)    GENERAL: The patient is a well-nourished female, in no acute distress. The vital signs are documented above. CARDIAC: There is a regular rate and rhythm.  VASCULAR: I do not detect carotid bruits. She has palpable dorsalis pedis pulses bilaterally. She has bilateral lower extremity swelling. PULMONARY: There is good air exchange bilaterally without wheezing or rales. ABDOMEN: Soft and non-tender with normal pitched bowel sounds.  MUSCULOSKELETAL: There are no major deformities or cyanosis. NEUROLOGIC: No focal weakness or paresthesias are detected. SKIN: There are no ulcers or rashes noted. PSYCHIATRIC: The patient has a normal affect.  DATA:    EKG: EKG on 07/12/2019 shows a normal sinus rhythm with a heart rate of 88 and no acute ST-T changes and no evidence of prior infarction.  ECHO: The echo was done on 07/12/2019.  Ejection fraction was estimated at 55 to 60%.  Left ventricle had normal function.  There was mild left ventricular hypertrophy.  The right ventricle had normal systolic function and was normal in size.  There was mild mitral regurg.  There was mild to moderate tricuspid regurg.  There was no evidence of aortic valve stenosis.  LABS: Her creatinine is 0.69.  GFR is 82.  CT ANGIOGRAM ABDOMEN PELVIS: I have reviewed the CT angiogram of her abdomen pelvis  which shows  a 5.7 cm infrarenal abdominal aortic aneurysm.  The neck is quite tortuous and she does not appear to be a candidate for an endovascular repair or a fenestrated graft.  She has significant calcific disease in her common iliac arteries and internal iliac arteries but it looks like we could potentially get into her external iliac arteries distally.  Otherwise she would require aortofemoral bypass grafting.  MEDICAL ISSUES:   5.7 CM INFRARENAL ABDOMINAL AORTIC ANEURYSM: The patient's cardiac work-up is in progress.  She is scheduled for a Cardiolite tomorrow.  Pending these results I think she can be scheduled for open repair of her aneurysm.  Based on review of her most recent CT scan she does not appear to be a candidate for endovascular repair or a fenestrated graft.  There is too much tortuosity at the proximal neck.  The iliacs are significantly calcified and I explained that hopefully we can bypass to the external iliac arteries bilaterally but potentially would have to go to the groins.  I have had a long discussion with her and her daughter who was on the phone about the indications for the procedure and the potential complications.  I explained the risk of rupture for aneurysm of this size is 7 to 10 %/year.  The risk of the procedure include but are not limited to MI, renal failure, bleeding, infection, including the risk of graft infection, ischemia of the bowel or lower extremities or other unpredictable medical problems.  I think her risk of mortality or major morbidity is approximately 5%.  The patient had concerns about Covid understandably.  She is scheduled for her second Covid vaccine next week.  All things considered we have decided that we would like to schedule her for surgery in late February as currently there are some scheduling issues given the rise in Covid cases in the hospital and the transition to emergent cases only.  She is tentatively scheduled for 08/28/2019.  [name] [facility] [phone]

## 2019-07-19 ENCOUNTER — Ambulatory Visit (HOSPITAL_COMMUNITY)
Admission: RE | Admit: 2019-07-19 | Discharge: 2019-07-19 | Disposition: A | Discharge status: Home / Self Care | Payer: Medicare Other | Source: Ambulatory Visit | Attending: Cardiology | Admitting: Cardiology

## 2019-07-19 ENCOUNTER — Ambulatory Visit: Payer: Medicare Other | Admitting: Vascular Surgery

## 2019-07-19 ENCOUNTER — Telehealth: Payer: Self-pay | Admitting: Cardiovascular Disease

## 2019-07-19 DIAGNOSIS — I714 Abdominal aortic aneurysm, without rupture: Secondary | ICD-10-CM | POA: Diagnosis not present

## 2019-07-19 DIAGNOSIS — R0602 Shortness of breath: Secondary | ICD-10-CM

## 2019-07-19 DIAGNOSIS — Z01810 Encounter for preprocedural cardiovascular examination: Secondary | ICD-10-CM

## 2019-07-19 LAB — MYOCARDIAL PERFUSION IMAGING
LV dias vol: 65 mL (ref 46–106)
LV sys vol: 19 mL
Peak HR: 114 {beats}/min
Rest HR: 92 {beats}/min
SDS: 5
SRS: 2
SSS: 7
TID: 0.8

## 2019-07-19 MED ORDER — REGADENOSON 0.4 MG/5ML IV SOLN
0.4000 mg | Freq: Once | INTRAVENOUS | Status: AC
  Administered 2019-07-19: 0.4 mg via INTRAVENOUS

## 2019-07-19 MED ORDER — TECHNETIUM TC 99M TETROFOSMIN IV KIT
9.9000 | PACK | Freq: Once | INTRAVENOUS | Status: AC | PRN
  Administered 2019-07-19: 9.9 via INTRAVENOUS
  Filled 2019-07-19: qty 10

## 2019-07-19 MED ORDER — TECHNETIUM TC 99M TETROFOSMIN IV KIT
32.5000 | PACK | Freq: Once | INTRAVENOUS | Status: AC | PRN
  Administered 2019-07-19: 32.5 via INTRAVENOUS
  Filled 2019-07-19: qty 33

## 2019-07-19 NOTE — Telephone Encounter (Signed)
Spoke with pt who is okay with triage nurse discussing PHI with her daughter. Pt daughter states pt has nausea since lexiscan today and wants to know how common this is.  Informed pt daughter that triage nurse consulted nuclear staff who informed nurse that side effects of lexiscan can include nausea and HA d/t dilation of blood vessels and this should disappear by tomorrow. Pt daughter states pt does have HA as well but more concerned about nausea. Advised to have pt eat bland foods, such as saltine crackers, ginger ale, eating slowly. Also advised that per pharmD pt can try flat coke to help alleviate sx. Advised may call office back or call PCP if still having nausea

## 2019-07-19 NOTE — Telephone Encounter (Signed)
Patient had a lexiscan today, her daughter states that her mother has been nauseous ever since. She is currently trying to eat but has no appetite. States she has not thrown up and denies any other symptoms. She would like to know if this is common after one of these tests.

## 2019-07-19 NOTE — Telephone Encounter (Signed)
Informed Mrs. Laroche's daughter of her normal stress test. Plans to proceed with surgery for AAA.   [place] T. Audie Box, [place]  [address] [place], [place] [address] [phone]  1:35 PM

## 2019-07-25 NOTE — Progress Notes (Signed)
Virtual Visit via Video Note   This visit type was conducted due to national recommendations for restrictions regarding the COVID-19 Pandemic (e.g. social distancing) in an effort to limit this patient's exposure and mitigate transmission in our community.  Due to her co-morbid illnesses, this patient is at least at moderate risk for complications without adequate follow up.  This format is felt to be most appropriate for this patient at this time.  All issues noted in this document were discussed and addressed.  A limited physical exam was performed with this format.  Please refer to the patient's chart for her consent to telehealth for [facility].   Date:  07/26/2019   ID:  Abigail Cohen, DOB 09/04/1940, MRN [id number]  Patient Location: Home Provider Location: Office  PCP:  Wenda Low, MD  Cardiologist:  Evalina Field, MD   Evaluation Performed:  Follow-Up Visit  Chief Complaint:  AAA  History of Present Illness:    Abigail Cohen is a 79 y.o. female with AAA, CAD (Coronary calcium seen on CT, negative stress) who presents for follow-up. Plans for open repair of AAA. Echo normal. Stress test normal. Started on ASA/statin at last visit. She is doing well and has plans for her AAA open repair in February. All her tests have come back normal and she is in great shape to proceed with surgery. Tolerating statin well and no issues. Still having intermittent back pain, but no CP, SOB, or LE edema reported. She is walking for exercise and no issues with this. BP under good control. Explained she should take ASA/statin forever. She is ok with this.   LDL 2018 -> 88  Problem List 1. AAA (5.7 cm infrarenal) 2. 3-vessel CAD  -calcifications seen on CT -negative nuc med MPI 2021 -EF 55-60% 3. Former tobacco abuse   The patient does not have symptoms concerning for COVID-19 infection (fever, chills, cough, or new shortness of breath).    Past Medical History:  Diagnosis Date  .  AAA (abdominal aortic aneurysm) ([place])   . Borderline hypertension   . Family history of anesthesia complication    daughter gets nauseated  . Hypertension   . Knee joint replacement status   . Osteoarthritis   . Osteopenia   . Uterine prolapse   . Wears glasses   . Wears partial dentures    upper and lower partial   Past Surgical History:  Procedure Laterality Date  . COLONOSCOPY    . HERNIA REPAIR  07/07/12   Dr Margot Chimes  . INGUINAL HERNIA REPAIR  07/07/2012   Procedure: HERNIA REPAIR INGUINAL ADULT;  Surgeon: Haywood Lasso, MD;  Location: [facility];  Service: General;  Laterality: Left;  left inguinal area  . REPLACEMENT TOTAL KNEE BILATERAL  2003  . TONSILLECTOMY  1945  . WISDOM TOOTH EXTRACTION       Current Meds  Medication Sig  . Acetaminophen (TYLENOL ARTHRITIS PAIN PO) Take 500 mg by mouth as directed.  Marland Kitchen aspirin EC 81 MG tablet Take 1 tablet (81 mg total) by mouth daily.  . Benfotiamine 150 MG CAPS Take 300 mg by mouth.  Marland Kitchen CALCIUM-VITAMIN D PO Take 1,500 mg by mouth daily.  Marland Kitchen denosumab (PROLIA) 60 MG/ML SOSY injection Inject 60 mg into the skin every 6 (six) months.  Marland Kitchen estradiol (ESTRACE) 0.1 MG/GM vaginal cream Place 2 g vaginally 2 (two) times a week.  . Magnesium 250 MG TABS Take 1 tablet by mouth daily.  Marland Kitchen  metoprolol succinate (TOPROL-XL) 25 MG 24 hr tablet Take 25 mg by mouth daily.  . Multiple Vitamin (MULTIVITAMIN) tablet Take 1 tablet by mouth daily.  . rosuvastatin (CRESTOR) 10 MG tablet Take 1 tablet (10 mg total) by mouth daily.  . Vitamin D, Cholecalciferol, 10 MCG (400 UNIT) CAPS Take 1 capsule by mouth daily.     Allergies:   Patient has no known allergies.   Social History   Tobacco Use  . Smoking status: Former Smoker    Years: 35.00    Start date: 07/05/2004    Quit date: 06/30/2005    Years since quitting: 14.0  . Smokeless tobacco: Never Used  Substance Use Topics  . Alcohol use: Yes    Comment: social  . Drug use: No       Family Hx: The patient's family history includes Arrhythmia in her mother; Cancer in her sister; Heart attack in her father and mother; Heart disease in her father and mother.  ROS:   Please see the history of present illness.     All other systems reviewed and are negative.   Prior CV studies:   The following studies were reviewed today:  NM MPI 07/19/2019 1. Normal myocardial perfusion imaging study without evidence of ischemia or prior infarction. 2. Normal left ventricular ejection fraction, greater than 65%.  TTE 07/12/2019  1. Left ventricular ejection fraction, by visual estimation, is 55 to 60%. The left ventricle has normal function. There is mildly increased left ventricular hypertrophy.  2. Global right ventricle has normal systolic function.The right ventricular size is normal. No increase in right ventricular wall thickness.  3. Left atrial size was normal.  4. Right atrial size was mildly dilated.  5. The mitral valve is normal in structure. Mild mitral valve regurgitation.  6. The tricuspid valve is normal in structure. Mild to moderate regurgitation.  7. The aortic valve is tricuspid. Aortic valve regurgitation is not visualized. No evidence of aortic valve stenosis.  8. The pulmonic valve was not well visualized. Pulmonic valve regurgitation is trivial.  9. The tricuspid regurgitant velocity is 2.34 m/s, and with an assumed right atrial pressure of 3 mmHg, the estimated right ventricular systolic pressure is normal at 24.9 mmHg. 10. The inferior vena cava is normal in size with greater than 50% respiratory variability, suggesting right atrial pressure of 3 mmHg.  Labs/Other Tests and Data Reviewed:    EKG:  No ECG reviewed.   Recent Labs: 08/02/2018: BUN 27; Creat 0.69; Potassium 4.6; Sodium 139   Recent Lipid Panel No results found for: CHOL, TRIG, HDL, CHOLHDL, LDLCALC, LDLDIRECT  Wt Readings from Last 3 Encounters:  07/19/19 128 lb (58.1 kg)  07/18/19 130  lb (59 kg)  07/12/19 128 lb 12.8 oz (58.4 kg)     Objective:    Vital Signs:  BP 129/83   Pulse 87   Ht 4\' 10"  (1.473 m)   BMI 26.75 kg/m    VITAL SIGNS:  reviewed  Gen: no acute distress CV/Neck: no JVD seen by video Pulm: talking complete sentences without distress  Ext: no edema Psych: good mood/affect   ASSESSMENT & PLAN:    1. AAA (abdominal aortic aneurysm) without rupture ([place]) 2. Coronary artery disease involving native coronary artery of native heart without angina pectoris 3. Preoperative cardiovascular examination -The Revised Cardiac Risk Index = 1 (high risk surgery, she has no significant CAD), which equates to 0.9% (low risk) estimated risk of perioperative myocardial infarction, pulmonary edema, ventricular fibrillation,  cardiac arrest, or complete heart block. \ - echo shows normal LVEF and normal MPI study - No further cardiac testing is recommended prior to surgery.   - Our service is available as needed in the peri-operative period.   - will continue ASA/statin  - ok to hold ASA in perioperative period at discretion of vascular surgery - we will send them a message to allow her to proceed. Overall, I think she will do well with surgery. We will see her on a yearly basis   COVID-19 Education: The signs and symptoms of COVID-19 were discussed with the patient and how to seek care for testing (follow up with PCP or arrange E-visit).  The importance of social distancing was discussed today.  Time:   Today, I have spent 25 minutes with the patient with telehealth technology discussing the above problems.    Medication Adjustments/Labs and Tests Ordered: Current medicines are reviewed at length with the patient today.  Concerns regarding medicines are outlined above.   Tests Ordered: No orders of the defined types were placed in this encounter.   Medication Changes: No orders of the defined types were placed in this encounter.   Follow Up:  In Person in  1 year(s)  Signed, Evalina Field, MD  07/26/2019 9:02 AM    [place]

## 2019-07-26 ENCOUNTER — Encounter: Payer: Self-pay | Admitting: Cardiovascular Disease

## 2019-07-26 ENCOUNTER — Telehealth (INDEPENDENT_AMBULATORY_CARE_PROVIDER_SITE_OTHER): Payer: Medicare Other | Admitting: Cardiovascular Disease

## 2019-07-26 VITALS — BP 129/83 | HR 87 | Ht <= 58 in

## 2019-07-26 DIAGNOSIS — I714 Abdominal aortic aneurysm, without rupture, unspecified: Secondary | ICD-10-CM

## 2019-07-26 DIAGNOSIS — I2510 Atherosclerotic heart disease of native coronary artery without angina pectoris: Secondary | ICD-10-CM

## 2019-07-26 DIAGNOSIS — Z01810 Encounter for preprocedural cardiovascular examination: Secondary | ICD-10-CM | POA: Diagnosis not present

## 2019-07-26 NOTE — Patient Instructions (Signed)
Medication Instructions:  The current medical regimen is effective;  continue present plan and medications.  *If you need a refill on your cardiac medications before your next appointment, please call your pharmacy*  Follow-Up: At [facility], you and your health needs are our priority.  As part of our continuing mission to provide you with exceptional heart care, we have created designated Provider Care Teams.  These Care Teams include your primary Cardiologist (physician) and Advanced Practice Providers (APPs -  Physician Assistants and Nurse Practitioners) who all work together to provide you with the care you need, when you need it.  Your next appointment:   12 month(s)  The format for your next appointment:   In Person  Provider:   Eleonore Chiquito, MD

## 2019-08-01 ENCOUNTER — Ambulatory Visit: Payer: Medicare Other

## 2019-08-01 ENCOUNTER — Ambulatory Visit: Payer: Medicare Other | Admitting: Vascular Surgery

## 2019-08-02 NOTE — Progress Notes (Addendum)
Patient ID: Abigail Cohen, female   DOB: 10/03/1940, 79 y.o.   MRN: [id number]   Patient location: Home My location: Office Persons participating in the virtual visit: patient, provider  Referring Provider: Wenda Low, MD  I connected with the patient on 08/02/19 at 11:06 AM EST by a video enabled telemedicine application and verified that I am speaking with the correct person.   I discussed the limitations of evaluation and management by telemedicine and the availability of in person appointments. The patient expressed understanding and agreed to proceed.   Details of the encounter are shown below.  HPI  HAZELL SIWIK is a 79 y.o.-year-old female, returning for follow-up for osteoporosis.  Last visit 1 year ago.  She was found to have an aortic aneurysm during investigation for back pain.  She will have surgery for this next month.  Pt was dx with OP in 2015.  Reviewed previous DEXA scan report: Date L1-L4 T score FN T score 33% distal Radius UD radius  06/22/2018 ([facility], [facility]) n/a RFN: -1.4 LFN: -1.4 -3.1 -2.2  05/10/2016 ([facility]) n/a RFN: -2.0 LFN: -1.5 -3.1 -2.8  05/09/2014 ([facility]) n/a RFN: n/a LFN: -1.7 -2.6 -1.3  04/05/2012 ([facility]) n/a RFN: -1.7 -2.2   11/02/2006 ([facility]) n/a RFN: -1.6 -2.1   10/04/2001 ([facility])  -0.5 LFN: -0.8 n/a    No fractures since last visit.  Denies dizziness, vertigo, orthostasis, poor vision.  Reviewed previous osteoporosis treatments: - Fosamax 2015-06/2016 - Prolia: 08/2016 02/2017 09/2017 03/2018 09/13/2018 03/20/2019  She denies: -Hip or thigh pain -Jaw pain No dental work in progress  No history of vitamin D deficiency: Lab Results  Component Value Date   VD25OH 47.97 08/02/2018   VD25OH 48.18 08/02/2017   VD25OH 41.40 08/04/2016  11/24/2015: vit D 49.2 06/03/2014: Vit D 39.7  She continues on: - vitamin D 400 units daily - calcium citrate 250-500 mg daily - Multivitamins daily  She started weightbearing  exercises before last visit.  She [facility] to [facility] on [address] >> stopped.  She was exercising at home on her glider, but more recently had back pain and she could not exercise.  Menopause was in her 90s.  No family history of osteoporosis.  No history of kidney stones.  No hypo or hypercalcemia or hyperparathyroidism: Lab Results  Component Value Date   PTH 29 08/04/2016   CALCIUM 9.6 08/02/2018   CALCIUM 9.3 08/02/2017   CALCIUM 9.1 08/04/2016  04/28/2016: Calcium 9.8  No history of thyrotoxicosis: Lab Results  Component Value Date   TSH 0.54 08/04/2016  05/20/2014: Total T4 7.0 (4.5-12)  No history of CKD. Last BUN/Cr: 07/11/2019: reportedly normal GFR Lab Results  Component Value Date   BUN 27 (H) 08/02/2018   CREATININE 0.69 08/02/2018  06/18/2016: EGFR 78, BUN/creatinine 29/0.72  04/28/2016: EGFR 85: BUN/creatinine 24/0.67  She had one steroid injection for hip bursitis in the past, but not recently.  She has peripheral neuropathy.  On alpha-lipoic acid 600 mg twice a day, started at our first visit, and also benfothiamine.  She started a statin (Crestor) and a BP medication (metoprolol).  ROS: Constitutional: no weight gain/no weight loss, no fatigue, no subjective hyperthermia, no subjective hypothermia Eyes: no blurry vision, no xerophthalmia ENT: no sore throat, no nodules palpated in neck, no dysphagia, no odynophagia, no hoarseness Cardiovascular: no CP/no SOB/no palpitations/no leg swelling Respiratory: no cough/no SOB/no wheezing Gastrointestinal: no N/no V/no D/no C/no acid reflux Musculoskeletal: no muscle aches/no joint aches  Skin: no rashes, no hair loss Neurological: no tremors/no numbness/no tingling/no dizziness  I reviewed pt's medications, allergies, PMH, social hx, family hx, and changes were documented in the history of present illness. Otherwise, unchanged from my initial visit note.  Past Medical History:   Diagnosis Date  . AAA (abdominal aortic aneurysm) ([place])   . Borderline hypertension   . Family history of anesthesia complication    daughter gets nauseated  . Hypertension   . Knee joint replacement status   . Osteoarthritis   . Osteopenia   . Uterine prolapse   . Wears glasses   . Wears partial dentures    upper and lower partial   Past Surgical History:  Procedure Laterality Date  . COLONOSCOPY    . HERNIA REPAIR  07/07/12   Dr Margot Chimes  . INGUINAL HERNIA REPAIR  07/07/2012   Procedure: HERNIA REPAIR INGUINAL ADULT;  Surgeon: Haywood Lasso, MD;  Location: [facility];  Service: General;  Laterality: Left;  left inguinal area  . REPLACEMENT TOTAL KNEE BILATERAL  2003  . TONSILLECTOMY  1945  . WISDOM TOOTH EXTRACTION     Social History   Social History  . Marital status: Divorced    Spouse name: N/A  . Number of children: N/A  . Years of education: N/A   Occupational History  . Not on file.   Social History Main Topics  . Smoking status: Former Smoker    Start date: 07/05/2004    Quit date: 06/30/2005  . Smokeless tobacco: Never Used  . Alcohol use Yes     Comment: social  . Drug use: No   Current Outpatient Medications  Medication Sig Dispense Refill  . Acetaminophen (TYLENOL ARTHRITIS PAIN PO) Take 500 mg by mouth as directed.    Marland Kitchen aspirin EC 81 MG tablet Take 1 tablet (81 mg total) by mouth daily.    . Benfotiamine 150 MG CAPS Take 300 mg by mouth.    Marland Kitchen CALCIUM-VITAMIN D PO Take 1,500 mg by mouth daily.    Marland Kitchen denosumab (PROLIA) 60 MG/ML SOSY injection Inject 60 mg into the skin every 6 (six) months.    Mariane Baumgarten Calcium (STOOL SOFTENER PO) Take 100 mg by mouth daily.    Marland Kitchen estradiol (ESTRACE) 0.1 MG/GM vaginal cream Place 2 g vaginally 2 (two) times a week.    . Magnesium 250 MG TABS Take 1 tablet by mouth daily.    . metoprolol succinate (TOPROL-XL) 25 MG 24 hr tablet Take 25 mg by mouth daily.    . Multiple Vitamin (MULTIVITAMIN) tablet Take 1  tablet by mouth daily.    Marland Kitchen OVER THE COUNTER MEDICATION     . rosuvastatin (CRESTOR) 10 MG tablet Take 1 tablet (10 mg total) by mouth daily. 90 tablet 3  . Vitamin D, Cholecalciferol, 10 MCG (400 UNIT) CAPS Take 1 capsule by mouth daily.     No current facility-administered medications for this visit.   No Known Allergies Family History  Problem Relation Age of Onset  . Heart disease Mother   . Heart attack Mother   . Arrhythmia Mother   . Heart disease Father   . Heart attack Father   . Cancer Sister        endometrial   PE: There were no vitals taken for this visit. Wt Readings from Last 3 Encounters:  07/19/19 128 lb (58.1 kg)  07/18/19 130 lb (59 kg)  07/12/19 128 lb 12.8 oz (58.4 kg)  Constitutional:  in NAD  The physical exam was not performed (virtual visit).  Assessment: 1. Osteoporosis  Plan: 1. Osteoporosis -This is likely age-related + postmenopausal -Reviewed together her latest DEXA scan reports.  Previous studies were checked on different machines so they were not directly comparable, however, latest bone density scan shows stability and either improvement at the level of the ultra distal radius and right femoral neck. -She is on a good dose of calcium and vitamin D.  She is trying to get 1000 to 1200 mg of calcium daily.  She is also taking vitamin D supplement and her most recent vitamin D level was normal.  We will recheck this when she can return to the clinic. -She is aware about fall precautions -As of now, she is not doing weightbearing exercises, initially due to back pain and now pending her aortic aneurysm repair -She is doing well on Prolia, of which she had 6 injections so far.  No jaw, hip, thigh pain.  She tolerates this well without complaints. -She is due for another bone density scan towards the end of this year.  I advised her to send me a message in December so I can order this.  Unchanged or slightly higher T-scores are desirable but the  most important outcome is her not having a fracture. -we will check a vitamin D when she can return to the clinic.  We will keep the BMP since she had one with PCP earlier this month and the GFR was reportedly normal -She is due for another DEXA scan at the end of this year.  I advised her to contact me in 06/2020 so I can order it. -I will see her back in a year  Orders Placed This Encounter  Procedures  . VITAMIN D 25 Hydroxy (Vit-D Deficiency, Fractures)   Component     Latest Ref Rng & Units 08/08/2019  VITD     30.00 - 100.00 ng/mL 40.21   Normal vitamin D.  Philemon Kingdom, MD PhD [facility] Endocrinology

## 2019-08-03 ENCOUNTER — Encounter: Payer: Self-pay | Admitting: Internal Medicine

## 2019-08-03 ENCOUNTER — Other Ambulatory Visit: Payer: Self-pay

## 2019-08-03 ENCOUNTER — Telehealth: Payer: Self-pay | Admitting: Internal Medicine

## 2019-08-03 ENCOUNTER — Ambulatory Visit (INDEPENDENT_AMBULATORY_CARE_PROVIDER_SITE_OTHER): Payer: Medicare Other | Admitting: Internal Medicine

## 2019-08-03 DIAGNOSIS — M810 Age-related osteoporosis without current pathological fracture: Secondary | ICD-10-CM | POA: Diagnosis not present

## 2019-08-03 DIAGNOSIS — I2510 Atherosclerotic heart disease of native coronary artery without angina pectoris: Secondary | ICD-10-CM

## 2019-08-03 NOTE — Telephone Encounter (Signed)
Ok, In that cas, she needs to come by the lb to have this drawn at her convenience.

## 2019-08-03 NOTE — Telephone Encounter (Signed)
Patient ph# [phone] called re: Patient is letting Dr. Cruzita Lederer know that PCP -Dr. Lorenda Hatchet did not do Labs for Vitamin D level.

## 2019-08-03 NOTE — Patient Instructions (Signed)
Please come back to the clinic for a vitamin D level.  Please call me in 06/2020 to order a new bone density scan.  Please come back for a follow-up appointment in 1 year.

## 2019-08-04 ENCOUNTER — Ambulatory Visit: Payer: Medicare Other | Attending: Internal Medicine

## 2019-08-04 DIAGNOSIS — Z23 Encounter for immunization: Secondary | ICD-10-CM | POA: Insufficient documentation

## 2019-08-04 NOTE — Progress Notes (Signed)
   Covid-19 Vaccination Clinic  Name:  Abigail Cohen    MRN: [id number] DOB: 1940/11/12  08/04/2019  [name] was observed post Covid-19 immunization for 15 minutes without incidence. She was provided with Vaccine Information Sheet and instruction to access the V-Safe system.   [name] was instructed to call 911 with any severe reactions post vaccine: Marland Kitchen Difficulty breathing  . Swelling of your face and throat  . A fast heartbeat  . A bad rash all over your body  . Dizziness and weakness    Immunizations Administered    Name Date Dose VIS Date Route   Pfizer COVID-19 Vaccine 08/04/2019  8:47 AM 0.3 mL 06/15/2019 Intramuscular   Manufacturer: [place]   Lot: [id number]   [place]: [id number]

## 2019-08-06 NOTE — Telephone Encounter (Signed)
Called pt and left voicemail requesting a call back in order to schedule a lab appt.

## 2019-08-07 NOTE — Telephone Encounter (Signed)
Patient is scheduled for lab appointment on 08/08/19

## 2019-08-08 ENCOUNTER — Other Ambulatory Visit (INDEPENDENT_AMBULATORY_CARE_PROVIDER_SITE_OTHER): Payer: Medicare Other

## 2019-08-08 ENCOUNTER — Other Ambulatory Visit: Payer: Self-pay

## 2019-08-08 DIAGNOSIS — M810 Age-related osteoporosis without current pathological fracture: Secondary | ICD-10-CM | POA: Diagnosis not present

## 2019-08-08 LAB — VITAMIN D 25 HYDROXY (VIT D DEFICIENCY, FRACTURES): VITD: 40.21 ng/mL (ref 30.00–100.00)

## 2019-08-09 ENCOUNTER — Telehealth: Payer: Self-pay

## 2019-08-09 NOTE — Telephone Encounter (Signed)
-----   Message from Philemon Kingdom, MD sent at 08/09/2019  9:34 AM EST ----- Lenna Sciara, can you please call pt: Her vitamin D level is normal.

## 2019-08-09 NOTE — Telephone Encounter (Signed)
Notified patient of message from Dr. Gherghe, patient expressed understanding and agreement. No further questions.  

## 2019-08-24 ENCOUNTER — Inpatient Hospital Stay (HOSPITAL_COMMUNITY): Admission: RE | Admit: 2019-08-24 | Payer: Medicare Other | Source: Ambulatory Visit

## 2019-08-24 ENCOUNTER — Other Ambulatory Visit (HOSPITAL_COMMUNITY)
Admission: RE | Admit: 2019-08-24 | Discharge: 2019-08-24 | Disposition: A | Discharge status: Home / Self Care | Payer: Medicare Other | Source: Ambulatory Visit | Attending: Vascular Surgery | Admitting: Vascular Surgery

## 2019-08-24 DIAGNOSIS — Z20822 Contact with and (suspected) exposure to covid-19: Secondary | ICD-10-CM | POA: Insufficient documentation

## 2019-08-24 DIAGNOSIS — Z01812 Encounter for preprocedural laboratory examination: Secondary | ICD-10-CM | POA: Insufficient documentation

## 2019-08-24 LAB — SARS CORONAVIRUS 2 (TAT 6-24 HRS): SARS Coronavirus 2: NEGATIVE

## 2019-08-24 NOTE — Progress Notes (Signed)
[facility] DR[facility]ORE [place], [place] AT [facility] OF [address] [place] [address] Phone: [phone] Fax: [phone]      Your procedure is scheduled on Tuesday Feb. 23, 2021.  Report to [facility] Main Entrance "A" at 05:30 A.M., and check in at the Admitting office.  Call this number if you have problems the morning of surgery:  [phone]  Call [phone] if you have any questions prior to your surgery date Monday-Friday 8am-4pm    Remember:  Do not eat or drink after midnight the night before your surgery  Take these medicines the morning of surgery with A SIP OF WATER: acetaminophen (TYLENOL) - as needed metoprolol succinate (TOPROL-XL) rosuvastatin (CRESTOR)  **Follow your surgeon's instructions on when to stop Aspirin.  If no instructions were given by your surgeon then you will need to call the office to get those instructions.     7 days prior to surgery STOP taking any Aleve, Naproxen, Ibuprofen, Motrin, Advil, Goody's, BC's, all herbal medications, fish oil, and all vitamins.    The Morning of Surgery  Do not wear jewelry, make-up or nail polish.  Do not wear lotions, powders, perfumes, or deodorant  Do not shave 48 hours prior to surgery.    Do not bring valuables to the hospital.  [facility] is not responsible for any belongings or valuables.  If you are a smoker, DO NOT Smoke 24 hours prior to surgery  If you wear a CPAP at night please bring your mask the morning of surgery   Remember that you must have someone to transport you home after your surgery, and remain with you for 24 hours if you are discharged the same day.   Please bring cases for contacts, glasses, hearing aids, dentures or bridgework because it cannot be worn into surgery.    Leave your suitcase in the car.  After surgery it may be brought to your room.  For patients admitted to the hospital, discharge time will be determined  by your treatment team.  Patients discharged the day of surgery will not be allowed to drive home.    Special instructions:   [place]- Preparing For Surgery  Before surgery, you can play an important role. Because skin is not sterile, your skin needs to be as free of germs as possible. You can reduce the number of germs on your skin by washing with CHG (chlorahexidine gluconate) Soap before surgery.  CHG is an antiseptic cleaner which kills germs and bonds with the skin to continue killing germs even after washing.    Oral Hygiene is also important to reduce your risk of infection.  Remember - BRUSH YOUR TEETH THE MORNING OF SURGERY WITH YOUR REGULAR TOOTHPASTE  Please do not use if you have an allergy to CHG or antibacterial soaps. If your skin becomes reddened/irritated stop using the CHG.  Do not shave (including legs and underarms) for at least 48 hours prior to first CHG shower. It is OK to shave your face.  Please follow these instructions carefully.   1. Shower the NIGHT BEFORE SURGERY and the MORNING OF SURGERY with CHG Soap.   2. If you chose to wash your hair, wash your hair first as usual with your normal shampoo.  3. After you shampoo, rinse your hair and body thoroughly to remove the shampoo.  4. Use CHG as you would any other liquid soap. You can apply  CHG directly to the skin and wash gently with a scrungie or a clean washcloth.   5. Apply the CHG Soap to your body ONLY FROM THE NECK DOWN.  Do not use on open wounds or open sores. Avoid contact with your eyes, ears, mouth and genitals (private parts). Wash Face and genitals (private parts)  with your normal soap.   6. Wash thoroughly, paying special attention to the area where your surgery will be performed.  7. Thoroughly rinse your body with warm water from the neck down.  8. DO NOT shower/wash with your normal soap after using and rinsing off the CHG Soap.  9. Pat yourself dry with a CLEAN TOWEL.  10. Wear  CLEAN PAJAMAS to bed the night before surgery, wear comfortable clothes the morning of surgery  11. Place CLEAN SHEETS on your bed the night of your first shower and DO NOT SLEEP WITH PETS.    Day of Surgery:  Please shower the morning of surgery with the CHG soap Do not apply any deodorants/lotions. Please wear clean clothes to the hospital/surgery center.   Remember to brush your teeth WITH YOUR REGULAR TOOTHPASTE.   Please read over the following fact sheets that you were given.

## 2019-08-27 ENCOUNTER — Encounter (HOSPITAL_COMMUNITY)
Admission: RE | Admit: 2019-08-27 | Discharge: 2019-08-27 | Disposition: A | Discharge status: Home / Self Care | Payer: Medicare Other | Source: Ambulatory Visit | Attending: Vascular Surgery | Admitting: Vascular Surgery

## 2019-08-27 ENCOUNTER — Other Ambulatory Visit: Payer: Self-pay

## 2019-08-27 ENCOUNTER — Encounter (HOSPITAL_COMMUNITY): Payer: Self-pay

## 2019-08-27 DIAGNOSIS — I10 Essential (primary) hypertension: Secondary | ICD-10-CM | POA: Insufficient documentation

## 2019-08-27 DIAGNOSIS — I723 Aneurysm of iliac artery: Secondary | ICD-10-CM | POA: Diagnosis present

## 2019-08-27 DIAGNOSIS — Z7289 Other problems related to lifestyle: Secondary | ICD-10-CM | POA: Diagnosis not present

## 2019-08-27 DIAGNOSIS — Z20822 Contact with and (suspected) exposure to covid-19: Secondary | ICD-10-CM | POA: Diagnosis present

## 2019-08-27 DIAGNOSIS — Z974 Presence of external hearing-aid: Secondary | ICD-10-CM | POA: Diagnosis not present

## 2019-08-27 DIAGNOSIS — Z87891 Personal history of nicotine dependence: Secondary | ICD-10-CM | POA: Insufficient documentation

## 2019-08-27 DIAGNOSIS — D696 Thrombocytopenia, unspecified: Secondary | ICD-10-CM | POA: Diagnosis not present

## 2019-08-27 DIAGNOSIS — D62 Acute posthemorrhagic anemia: Secondary | ICD-10-CM | POA: Diagnosis not present

## 2019-08-27 DIAGNOSIS — Z79899 Other long term (current) drug therapy: Secondary | ICD-10-CM | POA: Insufficient documentation

## 2019-08-27 DIAGNOSIS — Z01812 Encounter for preprocedural laboratory examination: Secondary | ICD-10-CM | POA: Insufficient documentation

## 2019-08-27 DIAGNOSIS — I739 Peripheral vascular disease, unspecified: Secondary | ICD-10-CM | POA: Diagnosis present

## 2019-08-27 DIAGNOSIS — I714 Abdominal aortic aneurysm, without rupture: Secondary | ICD-10-CM | POA: Insufficient documentation

## 2019-08-27 DIAGNOSIS — Z79890 Hormone replacement therapy (postmenopausal): Secondary | ICD-10-CM | POA: Diagnosis not present

## 2019-08-27 DIAGNOSIS — I361 Nonrheumatic tricuspid (valve) insufficiency: Secondary | ICD-10-CM | POA: Diagnosis present

## 2019-08-27 DIAGNOSIS — H9193 Unspecified hearing loss, bilateral: Secondary | ICD-10-CM | POA: Diagnosis present

## 2019-08-27 DIAGNOSIS — Z7982 Long term (current) use of aspirin: Secondary | ICD-10-CM | POA: Diagnosis not present

## 2019-08-27 LAB — URINALYSIS, ROUTINE W REFLEX MICROSCOPIC
Bilirubin Urine: NEGATIVE
Glucose, UA: NEGATIVE mg/dL
Ketones, ur: NEGATIVE mg/dL
Nitrite: NEGATIVE
Protein, ur: NEGATIVE mg/dL
Specific Gravity, Urine: 1.01 (ref 1.005–1.030)
pH: 6 (ref 5.0–8.0)

## 2019-08-27 LAB — COMPREHENSIVE METABOLIC PANEL
ALT: 24 U/L (ref 0–44)
AST: 30 U/L (ref 15–41)
Albumin: 4.2 g/dL (ref 3.5–5.0)
Alkaline Phosphatase: 37 U/L — ABNORMAL LOW (ref 38–126)
Anion gap: 12 (ref 5–15)
BUN: 28 mg/dL — ABNORMAL HIGH (ref 8–23)
CO2: 27 mmol/L (ref 22–32)
Calcium: 9.1 mg/dL (ref 8.9–10.3)
Chloride: 98 mmol/L (ref 98–111)
Creatinine, Ser: 0.72 mg/dL (ref 0.44–1.00)
GFR calc Af Amer: >60 mL/min (ref >60)
GFR calc non Af Amer: >60 mL/min (ref >60)
Glucose, Bld: 106 mg/dL — ABNORMAL HIGH (ref 70–99)
Potassium: 4.2 mmol/L (ref 3.5–5.1)
Sodium: 137 mmol/L (ref 135–145)
Total Bilirubin: 0.9 mg/dL (ref 0.3–1.2)
Total Protein: 6.7 g/dL (ref 6.5–8.1)

## 2019-08-27 LAB — SURGICAL PCR SCREEN
MRSA, PCR: NEGATIVE
Staphylococcus aureus: POSITIVE — AB

## 2019-08-27 LAB — CBC
HCT: 43.9 % (ref 36.0–46.0)
Hemoglobin: 13.9 g/dL (ref 12.0–15.0)
MCH: 31.8 pg (ref 26.0–34.0)
MCHC: 31.7 g/dL (ref 30.0–36.0)
MCV: 100.5 fL — ABNORMAL HIGH (ref 80.0–100.0)
Platelets: 158 10*3/uL (ref 150–400)
RBC: 4.37 MIL/uL (ref 3.87–5.11)
RDW: 13.4 % (ref 11.5–15.5)
WBC: 5.9 10*3/uL (ref 4.0–10.5)
nRBC: 0 % (ref 0.0–0.2)

## 2019-08-27 LAB — PROTIME-INR
INR: 1.1 (ref 0.8–1.2)
Prothrombin Time: 13.8 seconds (ref 11.4–15.2)

## 2019-08-27 LAB — URINALYSIS, MICROSCOPIC (REFLEX)

## 2019-08-27 LAB — APTT: aPTT: 28 seconds (ref 24–36)

## 2019-08-27 LAB — ABO/RH: ABO/RH(D): O POS

## 2019-08-27 NOTE — Progress Notes (Signed)
PCP - Dr. Wenda Low Cardiologist - Dr. Lake Bells O'Neal OB: Dr. Thurnell Lose Rheumatologist: Dr. Gavin Pound Ortho: Dr. Dorna Leitz  PPM/ICD - Denies  Chest x-ray - N/A EKG - 07/12/19 Stress Test - 07/19/19 ECHO - 07/12/19 Cardiac Cath - Denies  Sleep Study - Denies  Pt denies being diabetic.  Aspirin Instructions: Sheffield Slider, PA about aspirin instructions. Pt instructed to continue aspirin.  ERAS Protcol - No   COVID TEST- Yes, 08/25/19 - Negative   Coronavirus Screening  Have you experienced the following symptoms:  Cough yes/no: No Fever (>100.49F)  yes/no: No Runny nose yes/no: No Sore throat yes/no: No Difficulty breathing/shortness of breath  yes/no: No  Have you or a family member traveled in the last 14 days and where? yes/no: No   If the patient indicates "YES" to the above questions, their PAT will be rescheduled to limit the exposure to others and, the surgeon will be notified. THE PATIENT WILL NEED TO BE ASYMPTOMATIC FOR 14 DAYS.   If the patient is not experiencing any of these symptoms, the PAT nurse will instruct them to NOT bring anyone with them to their appointment since they may have these symptoms or traveled as well.   Please remind your patients and families that hospital visitation restrictions are in effect and the importance of the restrictions.     Anesthesia review: Yes, cardiac hx  Patient denies shortness of breath, fever, cough and chest pain at PAT appointment   All instructions explained to the patient, with a verbal understanding of the material. Patient agrees to go over the instructions while at home for a better understanding. Patient also instructed to self quarantine after being tested for COVID-19. The opportunity to ask questions was provided.

## 2019-08-27 NOTE — Progress Notes (Signed)
Patient has cardiac clerance. Dr. Tobias Alexander notified he advised char to be reviewed by assigned anesthesiologist.

## 2019-08-27 NOTE — Progress Notes (Signed)
Pt's UA results positive for rare bacteria and small leukocytes. Notified Olene Floss, CMA at Dr. Nicole Cella office of results.

## 2019-08-28 ENCOUNTER — Inpatient Hospital Stay (HOSPITAL_COMMUNITY): Payer: Medicare Other

## 2019-08-28 ENCOUNTER — Inpatient Hospital Stay (HOSPITAL_COMMUNITY): Payer: Medicare Other | Admitting: Certified Registered Nurse Anesthetist

## 2019-08-28 ENCOUNTER — Encounter (HOSPITAL_COMMUNITY)
Admission: RE | Disposition: A | Discharge status: Home / Self Care | Payer: Self-pay | Source: Home / Self Care | Attending: Vascular Surgery

## 2019-08-28 ENCOUNTER — Encounter (HOSPITAL_COMMUNITY): Payer: Self-pay | Admitting: Vascular Surgery

## 2019-08-28 ENCOUNTER — Inpatient Hospital Stay (HOSPITAL_COMMUNITY)
Admission: RE | Admit: 2019-08-28 | Discharge: 2019-09-03 | DRG: 271 | Disposition: A | Discharge status: Home Health | Payer: Medicare Other | Attending: Vascular Surgery | Admitting: Vascular Surgery

## 2019-08-28 ENCOUNTER — Other Ambulatory Visit: Payer: Self-pay

## 2019-08-28 DIAGNOSIS — I10 Essential (primary) hypertension: Secondary | ICD-10-CM | POA: Diagnosis present

## 2019-08-28 DIAGNOSIS — I739 Peripheral vascular disease, unspecified: Secondary | ICD-10-CM | POA: Diagnosis not present

## 2019-08-28 DIAGNOSIS — D62 Acute posthemorrhagic anemia: Secondary | ICD-10-CM | POA: Diagnosis not present

## 2019-08-28 DIAGNOSIS — Z7982 Long term (current) use of aspirin: Secondary | ICD-10-CM

## 2019-08-28 DIAGNOSIS — H9193 Unspecified hearing loss, bilateral: Secondary | ICD-10-CM | POA: Diagnosis not present

## 2019-08-28 DIAGNOSIS — I714 Abdominal aortic aneurysm, without rupture, unspecified: Secondary | ICD-10-CM | POA: Diagnosis present

## 2019-08-28 DIAGNOSIS — Z79899 Other long term (current) drug therapy: Secondary | ICD-10-CM

## 2019-08-28 DIAGNOSIS — Z7289 Other problems related to lifestyle: Secondary | ICD-10-CM

## 2019-08-28 DIAGNOSIS — Z79890 Hormone replacement therapy (postmenopausal): Secondary | ICD-10-CM

## 2019-08-28 DIAGNOSIS — Z20822 Contact with and (suspected) exposure to covid-19: Secondary | ICD-10-CM | POA: Diagnosis present

## 2019-08-28 DIAGNOSIS — I361 Nonrheumatic tricuspid (valve) insufficiency: Secondary | ICD-10-CM | POA: Diagnosis present

## 2019-08-28 DIAGNOSIS — M1990 Unspecified osteoarthritis, unspecified site: Secondary | ICD-10-CM | POA: Diagnosis not present

## 2019-08-28 DIAGNOSIS — Z87891 Personal history of nicotine dependence: Secondary | ICD-10-CM

## 2019-08-28 DIAGNOSIS — I723 Aneurysm of iliac artery: Secondary | ICD-10-CM | POA: Diagnosis present

## 2019-08-28 DIAGNOSIS — Z95828 Presence of other vascular implants and grafts: Secondary | ICD-10-CM | POA: Diagnosis not present

## 2019-08-28 DIAGNOSIS — D696 Thrombocytopenia, unspecified: Secondary | ICD-10-CM | POA: Diagnosis not present

## 2019-08-28 DIAGNOSIS — Z974 Presence of external hearing-aid: Secondary | ICD-10-CM | POA: Diagnosis not present

## 2019-08-28 DIAGNOSIS — Z419 Encounter for procedure for purposes other than remedying health state, unspecified: Secondary | ICD-10-CM

## 2019-08-28 HISTORY — PX: ABDOMINAL AORTIC ANEURYSM REPAIR: SHX42

## 2019-08-28 HISTORY — DX: Unspecified hearing loss, unspecified ear: H91.90

## 2019-08-28 HISTORY — DX: Presence of (intrauterine) contraceptive device: Z97.5

## 2019-08-28 LAB — POCT I-STAT 7, (LYTES, BLD GAS, ICA,H+H)
Acid-Base Excess: 3 mmol/L — ABNORMAL HIGH (ref 0.0–2.0)
Acid-base deficit: 1 mmol/L (ref 0.0–2.0)
Bicarbonate: 28.7 mmol/L — ABNORMAL HIGH (ref 20.0–28.0)
Bicarbonate: 24.6 mmol/L (ref 20.0–28.0)
Bicarbonate: 26.5 mmol/L (ref 20.0–28.0)
Calcium, Ion: 1.22 mmol/L (ref 1.15–1.40)
Calcium, Ion: 1.11 mmol/L — ABNORMAL LOW (ref 1.15–1.40)
Calcium, Ion: 1.14 mmol/L — ABNORMAL LOW (ref 1.15–1.40)
HCT: 33 % — ABNORMAL LOW (ref 36.0–46.0)
HCT: 34 % — ABNORMAL LOW (ref 36.0–46.0)
HCT: 33 % — ABNORMAL LOW (ref 36.0–46.0)
Hemoglobin: 11.2 g/dL — ABNORMAL LOW (ref 12.0–15.0)
Hemoglobin: 11.6 g/dL — ABNORMAL LOW (ref 12.0–15.0)
Hemoglobin: 11.2 g/dL — ABNORMAL LOW (ref 12.0–15.0)
O2 Saturation: 100 %
O2 Saturation: 100 %
O2 Saturation: 100 %
Potassium: 4.4 mmol/L (ref 3.5–5.1)
Potassium: 4 mmol/L (ref 3.5–5.1)
Potassium: 3.9 mmol/L (ref 3.5–5.1)
Sodium: 139 mmol/L (ref 135–145)
Sodium: 138 mmol/L (ref 135–145)
Sodium: 139 mmol/L (ref 135–145)
TCO2: 30 mmol/L (ref 22–32)
TCO2: 26 mmol/L (ref 22–32)
TCO2: 28 mmol/L (ref 22–32)
pCO2 arterial: 45.2 mmHg (ref 32.0–48.0)
pCO2 arterial: 43.9 mmHg (ref 32.0–48.0)
pCO2 arterial: 49.8 mmHg — ABNORMAL HIGH (ref 32.0–48.0)
pH, Arterial: 7.41 (ref 7.350–7.450)
pH, Arterial: 7.356 (ref 7.350–7.450)
pH, Arterial: 7.333 — ABNORMAL LOW (ref 7.350–7.450)
pO2, Arterial: 323 mmHg — ABNORMAL HIGH (ref 83.0–108.0)
pO2, Arterial: 233 mmHg — ABNORMAL HIGH (ref 83.0–108.0)
pO2, Arterial: 250 mmHg — ABNORMAL HIGH (ref 83.0–108.0)

## 2019-08-28 LAB — PREPARE RBC (CROSSMATCH)

## 2019-08-28 LAB — PROTIME-INR
INR: 1.4 — ABNORMAL HIGH (ref 0.8–1.2)
Prothrombin Time: 17 seconds — ABNORMAL HIGH (ref 11.4–15.2)

## 2019-08-28 LAB — BLOOD GAS, ARTERIAL
Acid-base deficit: 1.3 mmol/L (ref 0.0–2.0)
Bicarbonate: 23.9 mmol/L (ref 20.0–28.0)
Drawn by: 25493
FIO2: 32
O2 Saturation: 98.7 %
Patient temperature: 36.4
pCO2 arterial: 47.7 mmHg (ref 32.0–48.0)
pH, Arterial: 7.321 — ABNORMAL LOW (ref 7.350–7.450)
pO2, Arterial: 146 mmHg — ABNORMAL HIGH (ref 83.0–108.0)

## 2019-08-28 LAB — CBC
HCT: 34.1 % — ABNORMAL LOW (ref 36.0–46.0)
Hemoglobin: 11 g/dL — ABNORMAL LOW (ref 12.0–15.0)
MCH: 31.7 pg (ref 26.0–34.0)
MCHC: 32.3 g/dL (ref 30.0–36.0)
MCV: 98.3 fL (ref 80.0–100.0)
Platelets: 107 10*3/uL — ABNORMAL LOW (ref 150–400)
RBC: 3.47 MIL/uL — ABNORMAL LOW (ref 3.87–5.11)
RDW: 13.6 % (ref 11.5–15.5)
WBC: 15.7 10*3/uL — ABNORMAL HIGH (ref 4.0–10.5)
nRBC: 0 % (ref 0.0–0.2)

## 2019-08-28 LAB — POCT ACTIVATED CLOTTING TIME
Activated Clotting Time: 180 seconds
Activated Clotting Time: 197 seconds
Activated Clotting Time: 224 seconds
Activated Clotting Time: 230 seconds
Activated Clotting Time: 103 seconds
Activated Clotting Time: 186 seconds

## 2019-08-28 LAB — BASIC METABOLIC PANEL
Anion gap: 8 (ref 5–15)
BUN: 26 mg/dL — ABNORMAL HIGH (ref 8–23)
CO2: 23 mmol/L (ref 22–32)
Calcium: 7.9 mg/dL — ABNORMAL LOW (ref 8.9–10.3)
Chloride: 107 mmol/L (ref 98–111)
Creatinine, Ser: 0.63 mg/dL (ref 0.44–1.00)
GFR calc Af Amer: >60 mL/min (ref >60)
GFR calc non Af Amer: >60 mL/min (ref >60)
Glucose, Bld: 167 mg/dL — ABNORMAL HIGH (ref 70–99)
Potassium: 4 mmol/L (ref 3.5–5.1)
Sodium: 138 mmol/L (ref 135–145)

## 2019-08-28 LAB — APTT: aPTT: 33 seconds (ref 24–36)

## 2019-08-28 LAB — MAGNESIUM: Magnesium: 1.8 mg/dL (ref 1.7–2.4)

## 2019-08-28 SURGERY — ANEURYSM ABDOMINAL AORTIC REPAIR
Anesthesia: General | Site: Abdomen

## 2019-08-28 MED ORDER — ACETAMINOPHEN 325 MG PO TABS: 325.0000 mg | ORAL_TABLET | Freq: Once | ORAL | Status: DC | PRN

## 2019-08-28 MED ORDER — LIDOCAINE 2% (20 MG/ML) 5 ML SYRINGE
INTRAMUSCULAR | Status: AC
  Filled 2019-08-28: qty 5

## 2019-08-28 MED ORDER — PHENYLEPHRINE HCL-NACL 10-0.9 MG/250ML-% IV SOLN
INTRAVENOUS | Status: AC
  Filled 2019-08-28: qty 250

## 2019-08-28 MED ORDER — ALUM & MAG HYDROXIDE-SIMETH 200-200-20 MG/5ML PO SUSP: 15.0000 mL | ORAL | Status: DC | PRN

## 2019-08-28 MED ORDER — ROCURONIUM BROMIDE 100 MG/10ML IV SOLN
INTRAVENOUS | Status: DC | PRN
  Administered 2019-08-28: 10 mg via INTRAVENOUS
  Administered 2019-08-28 (×2): 20 mg via INTRAVENOUS
  Administered 2019-08-28: 10 mg via INTRAVENOUS
  Administered 2019-08-28: 20 mg via INTRAVENOUS
  Administered 2019-08-28: 10 mg via INTRAVENOUS
  Administered 2019-08-28: 50 mg via INTRAVENOUS
  Administered 2019-08-28: 20 mg via INTRAVENOUS
  Administered 2019-08-28: 10 mg via INTRAVENOUS

## 2019-08-28 MED ORDER — FENTANYL CITRATE (PF) 250 MCG/5ML IJ SOLN
INTRAMUSCULAR | Status: AC
  Filled 2019-08-28: qty 5

## 2019-08-28 MED ORDER — ONDANSETRON HCL 4 MG/2ML IJ SOLN
INTRAMUSCULAR | Status: AC
  Filled 2019-08-28: qty 2

## 2019-08-28 MED ORDER — NOREPINEPHRINE 4 MG/250ML-% IV SOLN
INTRAVENOUS | Status: AC
  Filled 2019-08-28: qty 250

## 2019-08-28 MED ORDER — CHLORHEXIDINE GLUCONATE CLOTH 2 % EX PADS: 6.0000 | MEDICATED_PAD | Freq: Once | CUTANEOUS | Status: DC

## 2019-08-28 MED ORDER — PROMETHAZINE HCL 25 MG/ML IJ SOLN
6.2500 mg | INTRAMUSCULAR | Status: DC | PRN
  Administered 2019-08-28: 6.25 mg via INTRAVENOUS

## 2019-08-28 MED ORDER — PROPOFOL 10 MG/ML IV BOLUS
INTRAVENOUS | Status: DC | PRN
  Administered 2019-08-28: 120 mg via INTRAVENOUS

## 2019-08-28 MED ORDER — LABETALOL HCL 5 MG/ML IV SOLN: 10.0000 mg | INTRAVENOUS | Status: DC | PRN

## 2019-08-28 MED ORDER — MAGNESIUM SULFATE 2 GM/50ML IV SOLN: 2.0000 g | Freq: Every day | INTRAVENOUS | Status: DC | PRN

## 2019-08-28 MED ORDER — GUAIFENESIN-DM 100-10 MG/5ML PO SYRP: 15.0000 mL | ORAL_SOLUTION | ORAL | Status: DC | PRN

## 2019-08-28 MED ORDER — OXYCODONE HCL 5 MG PO TABS
5.0000 mg | ORAL_TABLET | ORAL | Status: DC | PRN
  Administered 2019-08-29 – 2019-09-01 (×14): 10 mg via ORAL
  Administered 2019-09-01: 5 mg via ORAL
  Administered 2019-09-01 – 2019-09-02 (×5): 10 mg via ORAL
  Administered 2019-09-02: 5 mg via ORAL
  Administered 2019-09-02 – 2019-09-03 (×4): 10 mg via ORAL
  Filled 2019-08-28 (×15): qty 2
  Filled 2019-08-28: qty 1
  Filled 2019-08-28 (×4): qty 2
  Filled 2019-08-28: qty 1
  Filled 2019-08-28 (×4): qty 2

## 2019-08-28 MED ORDER — ONDANSETRON HCL 4 MG/2ML IJ SOLN
4.0000 mg | Freq: Four times a day (QID) | INTRAMUSCULAR | Status: DC | PRN
  Administered 2019-08-28: 4 mg via INTRAVENOUS
  Filled 2019-08-28: qty 2

## 2019-08-28 MED ORDER — SODIUM CHLORIDE 0.9 % IV BOLUS
1000.0000 mL | Freq: Once | INTRAVENOUS | Status: AC
  Administered 2019-08-28: 22:00:00 1000 mL via INTRAVENOUS

## 2019-08-28 MED ORDER — THROMBIN (RECOMBINANT) 20000 UNITS EX SOLR
CUTANEOUS | Status: AC
  Filled 2019-08-28: qty 20000

## 2019-08-28 MED ORDER — LACTATED RINGERS IV SOLN: INTRAVENOUS | Status: DC | PRN

## 2019-08-28 MED ORDER — ROCURONIUM BROMIDE 10 MG/ML (PF) SYRINGE
PREFILLED_SYRINGE | INTRAVENOUS | Status: AC
  Filled 2019-08-28: qty 10

## 2019-08-28 MED ORDER — PROTAMINE SULFATE 10 MG/ML IV SOLN
INTRAVENOUS | Status: DC | PRN
  Administered 2019-08-28: 50 mg via INTRAVENOUS

## 2019-08-28 MED ORDER — MANNITOL 25 % IV SOLN
INTRAVENOUS | Status: DC | PRN
  Administered 2019-08-28: 25 g via INTRAVENOUS

## 2019-08-28 MED ORDER — 0.9 % SODIUM CHLORIDE (POUR BTL) OPTIME
TOPICAL | Status: DC | PRN
  Administered 2019-08-28: 09:00:00 2000 mL

## 2019-08-28 MED ORDER — ONDANSETRON HCL 4 MG/2ML IJ SOLN
INTRAMUSCULAR | Status: DC | PRN
  Administered 2019-08-28: 4 mg via INTRAVENOUS

## 2019-08-28 MED ORDER — HEPARIN SODIUM (PORCINE) 1000 UNIT/ML IJ SOLN
INTRAMUSCULAR | Status: AC
  Filled 2019-08-28: qty 1

## 2019-08-28 MED ORDER — PHENYLEPHRINE HCL (PRESSORS) 10 MG/ML IV SOLN
INTRAVENOUS | Status: DC | PRN
  Administered 2019-08-28: 80 ug via INTRAVENOUS
  Administered 2019-08-28: 40 ug via INTRAVENOUS
  Administered 2019-08-28 (×2): 80 ug via INTRAVENOUS

## 2019-08-28 MED ORDER — PHENOL 1.4 % MT LIQD: 1.0000 | OROMUCOSAL | Status: DC | PRN

## 2019-08-28 MED ORDER — ZOLPIDEM TARTRATE 5 MG PO TABS: 5.0000 mg | ORAL_TABLET | Freq: Every evening | ORAL | Status: DC | PRN

## 2019-08-28 MED ORDER — HEPARIN SODIUM (PORCINE) 1000 UNIT/ML IJ SOLN
INTRAMUSCULAR | Status: DC | PRN
  Administered 2019-08-28: 1000 [IU] via INTRAVENOUS
  Administered 2019-08-28: 6000 [IU] via INTRAVENOUS
  Administered 2019-08-28: 2000 [IU] via INTRAVENOUS

## 2019-08-28 MED ORDER — DEXAMETHASONE SODIUM PHOSPHATE 10 MG/ML IJ SOLN
INTRAMUSCULAR | Status: AC
  Filled 2019-08-28: qty 1

## 2019-08-28 MED ORDER — MIDAZOLAM HCL 2 MG/2ML IJ SOLN
INTRAMUSCULAR | Status: AC
  Filled 2019-08-28: qty 2

## 2019-08-28 MED ORDER — HEMOSTATIC AGENTS (NO CHARGE) OPTIME
TOPICAL | Status: DC | PRN
  Administered 2019-08-28: 1 via TOPICAL

## 2019-08-28 MED ORDER — ACETAMINOPHEN 10 MG/ML IV SOLN: 1000.0000 mg | Freq: Once | INTRAVENOUS | Status: DC | PRN

## 2019-08-28 MED ORDER — FENTANYL CITRATE (PF) 100 MCG/2ML IJ SOLN
INTRAMUSCULAR | Status: AC
  Filled 2019-08-28: qty 2

## 2019-08-28 MED ORDER — LACTATED RINGERS IV SOLN: INTRAVENOUS | Status: DC

## 2019-08-28 MED ORDER — PROMETHAZINE HCL 25 MG/ML IJ SOLN
INTRAMUSCULAR | Status: AC
  Filled 2019-08-28: qty 1

## 2019-08-28 MED ORDER — ACETAMINOPHEN 325 MG PO TABS
325.0000 mg | ORAL_TABLET | ORAL | Status: DC | PRN
  Administered 2019-09-01 – 2019-09-03 (×7): 650 mg via ORAL
  Filled 2019-08-28 (×7): qty 2

## 2019-08-28 MED ORDER — METOPROLOL TARTRATE 5 MG/5ML IV SOLN: 2.0000 mg | INTRAVENOUS | Status: DC | PRN

## 2019-08-28 MED ORDER — SODIUM CHLORIDE 0.9 % IV SOLN
INTRAVENOUS | Status: DC | PRN
  Administered 2019-08-28: 500 mL

## 2019-08-28 MED ORDER — FENTANYL CITRATE (PF) 100 MCG/2ML IJ SOLN
25.0000 ug | INTRAMUSCULAR | Status: DC | PRN
  Administered 2019-08-28: 25 ug via INTRAVENOUS

## 2019-08-28 MED ORDER — HYDRALAZINE HCL 20 MG/ML IJ SOLN: 5.0000 mg | INTRAMUSCULAR | Status: DC | PRN

## 2019-08-28 MED ORDER — CHLORHEXIDINE GLUCONATE CLOTH 2 % EX PADS
6.0000 | MEDICATED_PAD | Freq: Every day | CUTANEOUS | Status: DC
  Administered 2019-08-28 – 2019-09-02 (×5): 6 via TOPICAL

## 2019-08-28 MED ORDER — PANTOPRAZOLE SODIUM 40 MG PO TBEC
40.0000 mg | DELAYED_RELEASE_TABLET | Freq: Every day | ORAL | Status: DC
  Administered 2019-08-31 – 2019-09-03 (×4): 40 mg via ORAL
  Filled 2019-08-28 (×5): qty 1

## 2019-08-28 MED ORDER — DEXTROSE-NACL 5-0.45 % IV SOLN: INTRAVENOUS | Status: DC

## 2019-08-28 MED ORDER — SUGAMMADEX SODIUM 200 MG/2ML IV SOLN
INTRAVENOUS | Status: DC | PRN
  Administered 2019-08-28: 130 mg via INTRAVENOUS

## 2019-08-28 MED ORDER — ACETAMINOPHEN 160 MG/5ML PO SOLN: 325.0000 mg | Freq: Once | ORAL | Status: DC | PRN

## 2019-08-28 MED ORDER — CEFAZOLIN SODIUM-DEXTROSE 2-4 GM/100ML-% IV SOLN
2.0000 g | Freq: Three times a day (TID) | INTRAVENOUS | Status: AC
  Administered 2019-08-28: 2 g via INTRAVENOUS
  Filled 2019-08-28 (×2): qty 100

## 2019-08-28 MED ORDER — PAPAVERINE HCL 30 MG/ML IJ SOLN
INTRAMUSCULAR | Status: AC
  Filled 2019-08-28: qty 2

## 2019-08-28 MED ORDER — SODIUM CHLORIDE 0.9 % IV SOLN
500.0000 mL | Freq: Once | INTRAVENOUS | Status: AC | PRN
  Administered 2019-08-28: 13:00:00 500 mL via INTRAVENOUS

## 2019-08-28 MED ORDER — PHENYLEPHRINE HCL-NACL 10-0.9 MG/250ML-% IV SOLN
INTRAVENOUS | Status: DC | PRN
  Administered 2019-08-28: 40 ug/min via INTRAVENOUS

## 2019-08-28 MED ORDER — HEPARIN SODIUM (PORCINE) 5000 UNIT/ML IJ SOLN
5000.0000 [IU] | Freq: Three times a day (TID) | INTRAMUSCULAR | Status: DC
  Administered 2019-08-30 – 2019-08-31 (×4): 5000 [IU] via SUBCUTANEOUS
  Filled 2019-08-28 (×4): qty 1

## 2019-08-28 MED ORDER — FENTANYL CITRATE (PF) 100 MCG/2ML IJ SOLN
INTRAMUSCULAR | Status: DC | PRN
  Administered 2019-08-28 (×2): 50 ug via INTRAVENOUS
  Administered 2019-08-28: 125 ug via INTRAVENOUS
  Administered 2019-08-28 (×2): 25 ug via INTRAVENOUS
  Administered 2019-08-28 (×3): 50 ug via INTRAVENOUS
  Administered 2019-08-28: 25 ug via INTRAVENOUS

## 2019-08-28 MED ORDER — SODIUM CHLORIDE 0.9 % IV SOLN
INTRAVENOUS | Status: AC
  Filled 2019-08-28: qty 1.2

## 2019-08-28 MED ORDER — ACETAMINOPHEN 325 MG RE SUPP: 325.0000 mg | RECTAL | Status: DC | PRN

## 2019-08-28 MED ORDER — POTASSIUM CHLORIDE CRYS ER 20 MEQ PO TBCR
20.0000 meq | EXTENDED_RELEASE_TABLET | Freq: Every day | ORAL | Status: AC | PRN
  Administered 2019-09-01: 17:00:00 40 meq via ORAL
  Filled 2019-08-28: qty 2

## 2019-08-28 MED ORDER — MEPERIDINE HCL 25 MG/ML IJ SOLN: 6.2500 mg | INTRAMUSCULAR | Status: DC | PRN

## 2019-08-28 MED ORDER — ALBUMIN HUMAN 5 % IV SOLN: INTRAVENOUS | Status: DC | PRN

## 2019-08-28 MED ORDER — CEFAZOLIN SODIUM-DEXTROSE 2-4 GM/100ML-% IV SOLN
2.0000 g | INTRAVENOUS | Status: AC
  Administered 2019-08-28: 08:00:00 2 g via INTRAVENOUS
  Filled 2019-08-28: qty 100

## 2019-08-28 MED ORDER — DEXAMETHASONE SODIUM PHOSPHATE 10 MG/ML IJ SOLN
INTRAMUSCULAR | Status: DC | PRN
  Administered 2019-08-28: 4 mg via INTRAVENOUS

## 2019-08-28 MED ORDER — MORPHINE SULFATE (PF) 2 MG/ML IV SOLN
2.0000 mg | INTRAVENOUS | Status: DC | PRN
  Administered 2019-08-28 – 2019-08-29 (×4): 2 mg via INTRAVENOUS
  Filled 2019-08-28 (×4): qty 1

## 2019-08-28 MED ORDER — SODIUM CHLORIDE 0.9 % IV SOLN: INTRAVENOUS | Status: DC

## 2019-08-28 MED ORDER — PROPOFOL 10 MG/ML IV BOLUS
INTRAVENOUS | Status: AC
  Filled 2019-08-28: qty 20

## 2019-08-28 MED ORDER — MUPIROCIN 2 % EX OINT
1.0000 "application " | TOPICAL_OINTMENT | Freq: Two times a day (BID) | CUTANEOUS | Status: AC
  Administered 2019-08-28 – 2019-09-01 (×8): 1 via NASAL
  Filled 2019-08-28 (×2): qty 22

## 2019-08-28 SURGICAL SUPPLY — 59 items
CANISTER SUCT 3000ML PPV (MISCELLANEOUS) ×2 IMPLANT
CANNULA VESSEL 3MM 2 BLNT TIP (CANNULA) ×4 IMPLANT
CLIP VESOCCLUDE MED 24/CT (CLIP) ×2 IMPLANT
CLIP VESOCCLUDE SM WIDE 24/CT (CLIP) ×2 IMPLANT
COVER WAND RF STERILE (DRAPES) ×1 IMPLANT
DERMABOND ADVANCED (GAUZE/BANDAGES/DRESSINGS) ×2
DERMABOND ADVANCED .7 DNX12 (GAUZE/BANDAGES/DRESSINGS) IMPLANT
DRAIN PENROSE 1/2X12 LTX STRL (WOUND CARE) ×1 IMPLANT
ELECT BLADE 4.0 EZ CLEAN MEGAD (MISCELLANEOUS) ×2
ELECT BLADE 6.5 EXT (BLADE) IMPLANT
ELECT REM PT RETURN 9FT ADLT (ELECTROSURGICAL) ×2
ELECTRODE BLDE 4.0 EZ CLN MEGD (MISCELLANEOUS) ×1 IMPLANT
ELECTRODE REM PT RTRN 9FT ADLT (ELECTROSURGICAL) ×1 IMPLANT
FELT TEFLON 1X6 (MISCELLANEOUS) ×1 IMPLANT
GLOVE BIO SURGEON STRL SZ 6.5 (GLOVE) ×2 IMPLANT
GLOVE BIO SURGEON STRL SZ7 (GLOVE) ×1 IMPLANT
GLOVE BIO SURGEON STRL SZ7.5 (GLOVE) ×2 IMPLANT
GLOVE BIOGEL PI IND STRL 6.5 (GLOVE) IMPLANT
GLOVE BIOGEL PI IND STRL 7.0 (GLOVE) IMPLANT
GLOVE BIOGEL PI IND STRL 8 (GLOVE) ×1 IMPLANT
GLOVE BIOGEL PI INDICATOR 6.5 (GLOVE) ×1
GLOVE BIOGEL PI INDICATOR 7.0 (GLOVE) ×3
GLOVE BIOGEL PI INDICATOR 8 (GLOVE) ×1
GLOVE ECLIPSE 6.5 STRL STRAW (GLOVE) ×1 IMPLANT
GOWN STRL REUS W/ TWL LRG LVL3 (GOWN DISPOSABLE) ×3 IMPLANT
GOWN STRL REUS W/TWL LRG LVL3 (GOWN DISPOSABLE) ×4
GRAFT HEMASHIELD 16X8MM (Vascular Products) ×1 IMPLANT
GRAFT HEMASHIELD 22X30M (Vascular Products) ×1 IMPLANT
HEMOSTAT SNOW SURGICEL 2X4 (HEMOSTASIS) ×1 IMPLANT
INSERT FOGARTY 61MM (MISCELLANEOUS) ×4 IMPLANT
INSERT FOGARTY SM (MISCELLANEOUS) ×8 IMPLANT
KIT BASIN OR (CUSTOM PROCEDURE TRAY) ×2 IMPLANT
KIT TURNOVER KIT B (KITS) ×2 IMPLANT
NS IRRIG 1000ML POUR BTL (IV SOLUTION) ×4 IMPLANT
PACK AORTA (CUSTOM PROCEDURE TRAY) ×2 IMPLANT
PAD ARMBOARD 7.5X6 YLW CONV (MISCELLANEOUS) ×4 IMPLANT
RETAINER VISCERA MED (MISCELLANEOUS) ×1 IMPLANT
SPONGE SURGIFOAM ABS GEL 100 (HEMOSTASIS) IMPLANT
STAPLER VISISTAT (STAPLE) ×4 IMPLANT
SUT ETHIBOND 5 LR DA (SUTURE) IMPLANT
SUT PDS AB 1 TP1 54 (SUTURE) ×5 IMPLANT
SUT PROLENE 3 0 SH 48 (SUTURE) ×6 IMPLANT
SUT PROLENE 3 0 SH DA (SUTURE) IMPLANT
SUT PROLENE 5 0 C 1 24 (SUTURE) ×2 IMPLANT
SUT PROLENE 5 0 C 1 36 (SUTURE) ×5 IMPLANT
SUT PROLENE 6 0 CC (SUTURE) ×1 IMPLANT
SUT SILK 2 0 (SUTURE) ×1
SUT SILK 2 0 SH CR/8 (SUTURE) ×2 IMPLANT
SUT SILK 2-0 18XBRD TIE 12 (SUTURE) IMPLANT
SUT VIC AB 2-0 CT1 27 (SUTURE) ×2
SUT VIC AB 2-0 CT1 TAPERPNT 27 (SUTURE) IMPLANT
SUT VIC AB 2-0 CTB1 (SUTURE) ×4 IMPLANT
SUT VIC AB 3-0 SH 27 (SUTURE) ×3
SUT VIC AB 3-0 SH 27X BRD (SUTURE) IMPLANT
SUT VICRYL 4-0 PS2 18IN ABS (SUTURE) ×2 IMPLANT
TOWEL GREEN STERILE (TOWEL DISPOSABLE) ×2 IMPLANT
TOWEL SURG RFD BLUE STRL DISP (DISPOSABLE) ×3 IMPLANT
TRAY FOLEY MTR SLVR 16FR STAT (SET/KITS/TRAYS/PACK) ×2 IMPLANT
WATER STERILE IRR 1000ML POUR (IV SOLUTION) ×4 IMPLANT

## 2019-08-28 NOTE — Anesthesia Procedure Notes (Signed)
Central Venous Catheter Insertion Performed by: Barnet Glasgow, MD, anesthesiologist Start/End2/23/2021 6:55 AM, 08/28/2019 7:10 AM Patient location: Pre-op. Preanesthetic checklist: patient identified, IV checked, site marked, risks and benefits discussed, surgical consent, monitors and equipment checked, pre-op evaluation, timeout performed and anesthesia consent Lidocaine 1% used for infiltration and patient sedated Hand hygiene performed  and maximum sterile barriers used  Catheter size: 8.5 Fr Total catheter length 14. Sheath introducer Procedure performed using ultrasound guided technique. Ultrasound Notes:anatomy identified, needle tip was noted to be adjacent to the nerve/plexus identified, no ultrasound evidence of intravascular and/or intraneural injection and image(s) printed for medical record Attempts: 1 Following insertion, line sutured and dressing applied. Post procedure assessment: blood return through all ports, free fluid flow and no air  Patient tolerated the procedure well with no immediate complications.

## 2019-08-28 NOTE — H&P (Addendum)
Patient name: Abigail Cohen MRN: [id number] DOB: 13-Jun-1941 Sex: female  REASON FOR ADMISSION:  Follow-up of abdominal aortic aneurysm.  HPI:  Abigail Cohen is a pleasant 79 y.o. female who I saw on 06/27/2019 with a 5.6 cm infrarenal abdominal aortic aneurysm. The CT scan was done with 5 mm cuts and based on that she did not appear to be a candidate for endovascular approach. However I recommended a CT scan with thin cuts to further assess her options with respect to an endovascular approach whether it be an endovascular aneurysm repair or a fenestrated graft. She comes in today to discuss those results.  The patient was sent for preoperative cardiac evaluation and was seen by Dr. Farris Has on 07/12/2019. The patient was scheduled for an echo and a stress test. She was started on aspirin and a statin. The stress test is scheduled for tomorrow. The echo was done on 07/13/2019.  With respect to her past surgical history she has had a previous hernia repair in the left lower quadrant. She denies any other abdominal surgery.  She denies any history of claudication, rest pain, or nonhealing ulcers.  She lives independently and is fairly active.  Since I saw her last, she denies any abdominal pain or back pain.      Past Medical History:  Diagnosis Date  . AAA (abdominal aortic aneurysm) ([place])   . Borderline hypertension   . Family history of anesthesia complication    daughter gets nauseated  . Hypertension   . Knee joint replacement status   . Osteoarthritis   . Osteopenia   . Uterine prolapse   . Wears glasses   . Wears partial dentures    upper and lower partial        Family History  Problem Relation Age of Onset  . Heart disease Mother   . Heart attack Mother   . Arrhythmia Mother   . Heart disease Father   . Heart attack Father   . Cancer Sister    endometrial   SOCIAL HISTORY:  Social History        Tobacco Use  . Smoking status: Former Smoker    Years: 35.00    Start date:  07/05/2004    Quit date: 06/30/2005    Years since quitting: 14.0  . Smokeless tobacco: Never Used  Substance Use Topics  . Alcohol use: Yes    Comment: social   No Known Allergies        Current Outpatient Medications  Medication Sig Dispense Refill  . Acetaminophen (TYLENOL ARTHRITIS PAIN PO) Take 500 mg by mouth as directed.    Marland Kitchen aspirin EC 81 MG tablet Take 1 tablet (81 mg total) by mouth daily.    . Benfotiamine 150 MG CAPS Take 300 mg by mouth.    Marland Kitchen CALCIUM-VITAMIN D PO Take 1,500 mg by mouth daily.    Marland Kitchen denosumab (PROLIA) 60 MG/ML SOSY injection Inject 60 mg into the skin every 6 (six) months.    Mariane Baumgarten Calcium (STOOL SOFTENER PO) Take 100 mg by mouth daily.    Marland Kitchen estradiol (ESTRACE) 0.1 MG/GM vaginal cream Place 2 g vaginally 2 (two) times a week.    . Magnesium 250 MG TABS Take 1 tablet by mouth daily.    . metoprolol succinate (TOPROL-XL) 25 MG 24 hr tablet Take 25 mg by mouth daily.    . Multiple Vitamin (MULTIVITAMIN) tablet Take 1 tablet by mouth daily.    Marland Kitchen OVER THE COUNTER  MEDICATION     . rosuvastatin (CRESTOR) 10 MG tablet Take 1 tablet (10 mg total) by mouth daily. 90 tablet 3  . Vitamin D, Cholecalciferol, 10 MCG (400 UNIT) CAPS Take 1 capsule by mouth daily.     No current facility-administered medications for this visit.   REVIEW OF SYSTEMS:  [X]  denotes positive finding, [ ]  denotes negative finding  Cardiac  Comments:  Chest pain or chest pressure:    Shortness of breath upon exertion:    Short of breath when lying flat:    Irregular heart rhythm:        Vascular    Pain in calf, thigh, or hip brought on by ambulation:    Pain in feet at night that wakes you up from your sleep:     Blood clot in your veins:    Leg swelling:  x       Pulmonary    Oxygen at home:    Productive cough:     Wheezing:         Neurologic    Sudden weakness in arms or legs:     Sudden numbness in arms or legs:     Sudden onset of difficulty speaking or slurred speech:     Temporary loss of vision in one eye:     Problems with dizziness:         Gastrointestinal    Blood in stool:     Vomited blood:         Genitourinary    Burning when urinating:     Blood in urine:        Psychiatric    Major depression:         Hematologic    Bleeding problems:    Problems with blood clotting too easily:        Skin    Rashes or ulcers:        Constitutional    Fever or chills:     PHYSICAL EXAM:      Vitals:   07/18/19 0857  BP: (!) 142/91  Pulse: 79  Resp: 20  Temp: (!) 97.5 F (36.4 C)  SpO2: 98%  Weight: 130 lb (59 kg)  Height: 4\' 10"  (1.473 m)   GENERAL: The patient is a well-nourished female, in no acute distress. The vital signs are documented above.  CARDIAC: There is a regular rate and rhythm.  VASCULAR: I do not detect carotid bruits.  She has palpable dorsalis pedis pulses bilaterally.  She has bilateral lower extremity swelling.  PULMONARY: There is good air exchange bilaterally without wheezing or rales.  ABDOMEN: Soft and non-tender with normal pitched bowel sounds.  MUSCULOSKELETAL: There are no major deformities or cyanosis.  NEUROLOGIC: No focal weakness or paresthesias are detected.  SKIN: There are no ulcers or rashes noted.  PSYCHIATRIC: The patient has a normal affect.  DATA:   EKG: EKG on 07/12/2019 shows a normal sinus rhythm with a heart rate of 88 and no acute ST-T changes and no evidence of prior infarction.  ECHO: The echo was done on 07/12/2019. Ejection fraction was estimated at 55 to 60%. Left ventricle had normal function. There was mild left ventricular hypertrophy. The right ventricle had normal systolic function and was normal in size. There was mild mitral regurg. There was mild to moderate tricuspid regurg. There was no evidence of aortic valve stenosis.  LABS: Her creatinine is 0.69. GFR is 82.  CT ANGIOGRAM ABDOMEN PELVIS: I have reviewed  the CT angiogram of her abdomen pelvis which shows a 5.7 cm infrarenal  abdominal aortic aneurysm. The neck is quite tortuous and she does not appear to be a candidate for an endovascular repair or a fenestrated graft. She has significant calcific disease in her common iliac arteries and internal iliac arteries but it looks like we could potentially get into her external iliac arteries distally. Otherwise she would require aortofemoral bypass grafting.  MEDICAL ISSUES:  5.7 CM INFRARENAL ABDOMINAL AORTIC ANEURYSM: She has completed her cardiac work-up and has been cleared for surgery.  Based on review of her most recent CT scan she does not appear to be a candidate for endovascular repair or a fenestrated graft. There is too much tortuosity at the proximal neck. The iliacs are significantly calcified and I explained that hopefully we can bypass to the external iliac arteries bilaterally but potentially would have to go to the groins. I have had a long discussion with her and her daughter who was on the phone about the indications for the procedure and the potential complications. I explained the risk of rupture for aneurysm of this size is 7 to 10 %/year. The risk of the procedure include but are not limited to MI, renal failure, bleeding, infection, including the risk of graft infection, ischemia of the bowel or lower extremities or other unpredictable medical problems. I think her risk of mortality or major morbidity is approximately 5%.   [name]  [facility] [phone]

## 2019-08-28 NOTE — Anesthesia Procedure Notes (Signed)
Arterial Line Insertion Start/End2/23/2021 6:40 AM, 08/28/2019 6:50 AM Performed by: Larene Beach, CRNA, CRNA  Patient location: Pre-op. Preanesthetic checklist: patient identified, IV checked, site marked, risks and benefits discussed, surgical consent, monitors and equipment checked, pre-op evaluation, timeout performed and anesthesia consent Lidocaine 1% used for infiltration Left, radial was placed Catheter size: 20 G Hand hygiene performed  and maximum sterile barriers used   Attempts: 1 Procedure performed without using ultrasound guided technique. Following insertion, dressing applied and Biopatch. Post procedure assessment: normal and unchanged  Patient tolerated the procedure well with no immediate complications. Additional procedure comments: Performed by R Lyndle Herrlich.

## 2019-08-28 NOTE — Op Note (Signed)
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 Scot Dock, MD  ASSIST: Ruta Hinds, MD, Karoline Caldwell, [place]  ANESTHESIA: General  EBL: Per anesthesia records  INDICATIONS:    Abigail Cohen is a 79 y.o. 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  She also had a right common iliac artery aneurysm.  She had significant tortuosity at the neck of the aneurysm and therefore was not a candidate for an endovascular repair.  Likewise she was not a candidate for a fenestrated graft.  After preoperative cardiac evaluation she presents for elective open aneurysm repair  FINDINGS:   I sewed into side to the external iliac arteries bilaterally and the common iliac arteries were oversewn proximally.  This preserved flow into the hypogastrics bilaterally.  Palpable pedal pulses at the completion of the procedure.  There was good backbleeding completion of procedure and this was ligated.  TECHNIQUE:   The patient was taken to the operating room after monitoring lines were placed by anesthesia.  She received a general anesthetic.  The abdomen and groins were prepped and draped in the usual sterile fashion.  The abdomen was entered through a long longitudinal incision.  The transverse colon was reflected superiorly and the small bowel reflected to the right.  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  The retroperitoneum was divided just to the right of the midline exposing the neck of the aneurysm up to the level of the renal vein.  The right renal artery was also identified.  I dissected free the neck  circumferentially so that I could place a clamp below the renal artery.  The dissection was continued distally onto the right common iliac artery and then down to the bifurcation into the hypogastric and external iliac arteries.  These were both individually controlled.  The common iliac artery was aneurysmal and all had significant plaque.  The hypogastric artery was significantly calcified.  The external carotid artery on the right was soft and patent.  The sigmoid colon was reflected to the right and the white line of Toldt taken down exposing the distal left common iliac artery, hypogastric artery, and external iliac artery.  Again there was significant disease in the common iliac artery and also the hypogastric artery.  However the external iliac artery was soft.  A tunnel was created under the mesentery.  The patient was heparinized.  The infrarenal clamp was placed below the renal arteries.  The hypogastric arteries and external iliac arteries bilaterally were clamped.  The aneurysm was then entered by opening it longitudinally.  There were several lumbar sutures were oversewn with 2-0 silk ties.  The IMA have been controlled with a vessel loop.  I teed off the aneurysm proximally.  The 8 was somewhat thin.  There was significant calcific disease.  Using a felt cuff a 16 x 8 mm Dacron graft was selected and cut to the appropriate length.  It was sewn in 2 and 2 the infrarenal aorta using the felt cuff using a running 3-0 Prolene suture.  After completing the anastomosis the graft was flushed the anastomosis was hemostatic.  Both  limbs of the graft were clamped and then the infrarenal aorta clamped again.  Attention was first turned to the right external iliac artery anastomosis.  I dissected out the common iliac artery and endarterectomized segment such that I could oversew the right common iliac artery proximal to its bifurcation.  This was done with a running 5-0 Prolene suture.  There was good  hemostasis.  Next the hypogastric artery was controlled proximally and the external iliac artery controlled distally.  A longitudinal arteriotomy was made in the external iliac artery.  The right limb of the graft was cut to the appropriate length spatulated and sewn end-to-side to the right external iliac artery with 5-0 Prolene suture.  Prior to completing this anastomosis the arteries were backbled and flushed appropriately and the anastomosis completed.  Flow was reestablished to the right leg.  There was some drop in pressure but the patient tolerated this.  Next attention was turned to the left iliac anastomosis.  The left limb of the graft was brought under the tunnel to the mesentery and the left common iliac artery was oversewn with a three 3-0 Prolene.  The hypogastric artery was controlled with a clamp and then the external iliac artery clamped control distally.  A longitudinal arteriotomy was made in the external iliac artery.  The left limb of the graft cut to the appropriate length spatulated and sewn into side to the left external iliac artery using continuous 5-0 Prolene suture.  Prior to completing this anastomosis the artery was backbled and flushed appropriately the anastomosis was then completed.  Flow was reestablished of the left leg.  Again there was a drop in pressure appropriately and the patient did well with this.  I did interrogate the hypogastric arteries with the Doppler and there was good flow in both hypogastric arteries and also in the external iliac arteries.  There are also palpable femoral pulses.  The heparin was partially reversed with protamine.  Above the aneurysm the aorta was somewhat thin and therefore elected to place a 20 mm cough around the infrarenal segment and the anastomosis.  This was opened longitudinally after being cut to the appropriate length and then sewn with running 3-0 Prolene suture to cover the infrarenal aorta and the anastomosis.  Hemostasis was  obtained in the wound.  On the left side the retroperitoneum was closed with running 2-0 Vicryl.  The aneurysm sac was closed over the aorta with running 2-0 Vicryl sutures.  The retroperitoneum was closed with running 2-0 Vicryl sutures.  The abdominal contents were returned to their normal position.  The fascial layer which was fairly thin was closed with 2 running PDS sutures.  The subcutaneous layer was closed with 3-0 Vicryl and the skin closed with 4-0 Vicryl.  Dermabond was applied.  Patient tolerated the procedure well was transferred recovery room in stable condition.  All needle and sponge counts were correct.  [name], MD, FACS [facility]  DATE OF DICTATION:   08/28/2019

## 2019-08-28 NOTE — Progress Notes (Addendum)
  Day of Surgery Note    Subjective:  Sleepy but awakes to voice   Vitals:   08/28/19 1233 08/28/19 1248  BP: 111/61 (!) 106/55  Pulse: 88 84  Resp: 17 14  Temp: (!) 97.5 F (36.4 C)   SpO2: 99% 100%    Incisions:   Clean and dry  Extremities:  Palpable DP pulses bilaterally Cardiac:  regular Lungs:  Non labored Abdomen:  Soft, NT/ND   Assessment/Plan:  This is a 79 y.o. female who is s/p  Open abdominal aortic aneurysm repair with aortobiiliac graft  -pt doing well in pacu with palpable DP pulses bilaterally -abdomen soft -to 2 CVICU later today -continue npo   Leontine Locket, PA-C 08/28/2019 1:00 PM [phone]  I have interviewed the patient and examined the patient. I agree with the findings by the PA. Awaiting bed on 2H.  Gae Gallop, MD [phone]

## 2019-08-28 NOTE — Transfer of Care (Signed)
Immediate Anesthesia Transfer of Care Note  Patient: Abigail Cohen  Procedure(s) Performed: ANEURYSM ABDOMINAL AORTIC REPAIR OPEN (N/A Abdomen)  Patient Location: PACU  Anesthesia Type:General  Level of Consciousness: awake and drowsy  Airway & Oxygen Therapy: Patient Spontanous Breathing and Patient connected to nasal cannula oxygen  Post-op Assessment: Report given to RN and Post -op Vital signs reviewed and stable  Post vital signs: stable  Last Vitals:  Vitals Value Taken Time  BP 111/61 08/28/19 1233  Temp    Pulse 89 08/28/19 1238  Resp 22 08/28/19 1238  SpO2 100 % 08/28/19 1238  Vitals shown include unvalidated device data.  Last Pain:  Vitals:   08/28/19 0601  TempSrc: Oral  PainSc: 0-No pain      Patients Stated Pain Goal: 3 ([id number])  Complications: No apparent anesthesia complications

## 2019-08-28 NOTE — Anesthesia Procedure Notes (Addendum)
Procedure Name: Intubation Date/Time: 08/28/2019 7:57 AM Performed by: Larene Beach, CRNA Pre-anesthesia Checklist: Patient identified, Emergency Drugs available, Suction available and Patient being monitored Patient Re-evaluated:Patient Re-evaluated prior to induction Oxygen Delivery Method: Circle system utilized Preoxygenation: Pre-oxygenation with 100% oxygen Induction Type: IV induction Ventilation: Mask ventilation without difficulty Laryngoscope Size: Miller and 2 Grade View: Grade I Tube type: Oral Tube size: 7.0 mm Number of attempts: 1 Airway Equipment and Method: Stylet Placement Confirmation: ETT inserted through vocal cords under direct vision,  positive ETCO2 and breath sounds checked- equal and bilateral Secured at: 20 cm Tube secured with: Tape Dental Injury: Teeth and Oropharynx as per pre-operative assessment

## 2019-08-28 NOTE — Anesthesia Preprocedure Evaluation (Addendum)
Anesthesia Evaluation  Patient identified by MRN, date of birth, ID band Patient awake    Reviewed: Allergy & Precautions, NPO status , Patient's Chart, lab work & pertinent test results  Airway Mallampati: I  TM Distance: >3 FB Neck ROM: Full    Dental  (+) Chipped, Poor Dentition, Missing, Dental Advisory Given   Pulmonary former smoker,     + decreased breath sounds      Cardiovascular hypertension, Pt. on home beta blockers + Peripheral Vascular Disease   Rhythm:Regular Rate:Normal     Neuro/Psych negative neurological ROS  negative psych ROS   GI/Hepatic negative GI ROS, Neg liver ROS,   Endo/Other  negative endocrine ROS  Renal/GU negative Renal ROS     Musculoskeletal  (+) Arthritis ,   Abdominal Normal abdominal exam  (+)   Peds  Hematology   Anesthesia Other Findings   Reproductive/Obstetrics                            Echo: 1. Left ventricular ejection fraction, by visual estimation, is 55 to  60%. The left ventricle has normal function. There is mildly increased  left ventricular hypertrophy.  2. Global right ventricle has normal systolic function.The right  ventricular size is normal. No increase in right ventricular wall  thickness.  3. Left atrial size was normal.  4. Right atrial size was mildly dilated.  5. The mitral valve is normal in structure. Mild mitral valve  regurgitation.  6. The tricuspid valve is normal in structure. Mild to moderate  regurgitation.  7. The aortic valve is tricuspid. Aortic valve regurgitation is not  visualized. No evidence of aortic valve stenosis.  8. The pulmonic valve was not well visualized. Pulmonic valve  regurgitation is trivial.  9. The tricuspid regurgitant velocity is 2.34 m/s, and with an assumed  right atrial pressure of 3 mmHg, the estimated right ventricular systolic  pressure is normal at 24.9 mmHg.  10. The  inferior vena cava is normal in size with greater than 50%  respiratory variability, suggesting right atrial pressure of 3 mmHg.   Anesthesia Physical Anesthesia Plan  ASA: III  Anesthesia Plan: General   Post-op Pain Management:    Induction: Intravenous  PONV Risk Score and Plan: 4 or greater and Ondansetron, Treatment may vary due to age or medical condition and Dexamethasone  Airway Management Planned: Oral ETT  Additional Equipment: Arterial line and CVP  Intra-op Plan:   Post-operative Plan: Extubation in OR  Informed Consent: I have reviewed the patients History and Physical, chart, labs and discussed the procedure including the risks, benefits and alternatives for the proposed anesthesia with the patient or authorized representative who has indicated his/her understanding and acceptance.     Dental advisory given  Plan Discussed with: CRNA  Anesthesia Plan Comments:        Anesthesia Quick Evaluation

## 2019-08-29 ENCOUNTER — Encounter: Payer: Self-pay | Admitting: *Deleted

## 2019-08-29 LAB — CBC
HCT: 28.6 % — ABNORMAL LOW (ref 36.0–46.0)
Hemoglobin: 9.4 g/dL — ABNORMAL LOW (ref 12.0–15.0)
MCH: 32.4 pg (ref 26.0–34.0)
MCHC: 32.9 g/dL (ref 30.0–36.0)
MCV: 98.6 fL (ref 80.0–100.0)
Platelets: 96 10*3/uL — ABNORMAL LOW (ref 150–400)
RBC: 2.9 MIL/uL — ABNORMAL LOW (ref 3.87–5.11)
RDW: 13.7 % (ref 11.5–15.5)
WBC: 9.5 10*3/uL (ref 4.0–10.5)
nRBC: 0 % (ref 0.0–0.2)

## 2019-08-29 LAB — COMPREHENSIVE METABOLIC PANEL
ALT: 46 U/L — ABNORMAL HIGH (ref 0–44)
AST: 58 U/L — ABNORMAL HIGH (ref 15–41)
Albumin: 2.8 g/dL — ABNORMAL LOW (ref 3.5–5.0)
Alkaline Phosphatase: 16 U/L — ABNORMAL LOW (ref 38–126)
Anion gap: 8 (ref 5–15)
BUN: 12 mg/dL (ref 8–23)
CO2: 23 mmol/L (ref 22–32)
Calcium: 7.1 mg/dL — ABNORMAL LOW (ref 8.9–10.3)
Chloride: 108 mmol/L (ref 98–111)
Creatinine, Ser: 0.6 mg/dL (ref 0.44–1.00)
GFR calc Af Amer: >60 mL/min (ref >60)
GFR calc non Af Amer: >60 mL/min (ref >60)
Glucose, Bld: 162 mg/dL — ABNORMAL HIGH (ref 70–99)
Potassium: 3.8 mmol/L (ref 3.5–5.1)
Sodium: 139 mmol/L (ref 135–145)
Total Bilirubin: 0.7 mg/dL (ref 0.3–1.2)
Total Protein: 4.2 g/dL — ABNORMAL LOW (ref 6.5–8.1)

## 2019-08-29 LAB — AMYLASE: Amylase: 67 U/L (ref 28–100)

## 2019-08-29 LAB — MAGNESIUM: Magnesium: 1.7 mg/dL (ref 1.7–2.4)

## 2019-08-29 MED ORDER — SODIUM CHLORIDE 0.9% FLUSH
10.0000 mL | Freq: Two times a day (BID) | INTRAVENOUS | Status: DC
  Administered 2019-09-01 – 2019-09-02 (×2): 10 mL

## 2019-08-29 MED ORDER — SODIUM CHLORIDE 0.9% FLUSH: 10.0000 mL | INTRAVENOUS | Status: DC | PRN

## 2019-08-29 NOTE — Anesthesia Postprocedure Evaluation (Signed)
Anesthesia Post Note  Patient: Abigail Cohen  Procedure(s) Performed: ANEURYSM ABDOMINAL AORTIC REPAIR OPEN (N/A Abdomen)     Patient location during evaluation: PACU Anesthesia Type: General Level of consciousness: awake and alert Pain management: pain level controlled Vital Signs Assessment: post-procedure vital signs reviewed and stable Respiratory status: spontaneous breathing, nonlabored ventilation, respiratory function stable and patient connected to nasal cannula oxygen Cardiovascular status: blood pressure returned to baseline and stable Postop Assessment: no apparent nausea or vomiting Anesthetic complications: no    Last Vitals:  Vitals:   08/29/19 0700 08/29/19 0743  BP: 107/68   Pulse: (!) 103   Resp: (!) 27   Temp:  37.2 C  SpO2: 96%     Last Pain:  Vitals:   08/29/19 0743  TempSrc: Oral  PainSc:                  Effie Berkshire

## 2019-08-29 NOTE — Plan of Care (Signed)
Continue to monitor

## 2019-08-29 NOTE — Progress Notes (Signed)
Chaplain engaged in initial visit with Ms. Beedle's daughter.  Chaplain offered support and explained spiritual care offered.  Chaplain will follow-up as needed.

## 2019-08-29 NOTE — Progress Notes (Signed)
   08/29/19 1400  Mobility  Activity Ambulated in room  Range of Motion Active;All extremities  Level of Assistance Contact guard assist, steadying assist  Assistive Device Four wheel walker  Minutes Ambulated 10 minutes  Distance Ambulated (ft) 50 ft  Mobility Response Tolerated well

## 2019-08-29 NOTE — Evaluation (Signed)
Occupational Therapy Evaluation Patient Details Name: Abigail Cohen MRN: [id number] DOB: [birth year] Today's Date: 08/29/2019    History of Present Illness 79 y.o. female who I saw on 06/27/2019 with a 5.6 cm infrarenal abdominal aortic aneurysm. Pt with PMH including HTN, OA, uterine prolapse. Pt underwent Open abdominal aortic aneurysm repair with aortobiiliac graft on 2/23.   Clinical Impression   Pt PTA; pt living with daughter and reports independence with ADL. Pt currently limited be decreased activity tolerance, decreased ability to care for self and decreased strength.Pt ambulating in room with minguardA with RW a short distance from bed to bathroom. Pt minA to [place] for ADL. Pt with pain in abdomen inhibiting ability to properly care for self. Pt would benefit from continued OT skilled services for ADL, mobility and energy conservation. OT following acutely.    Follow Up Recommendations  Home health OT;Supervision/Assistance - 24 hour    Equipment Recommendations  3 in 1 bedside commode    Recommendations for Other Services       Precautions / Restrictions Precautions Precautions: Fall Restrictions Weight Bearing Restrictions: No      Mobility Bed Mobility Overal bed mobility: Needs Assistance Bed Mobility: Rolling;Sidelying to Sit;Sit to Supine Rolling: Min assist Sidelying to sit: Mod assist   Sit to supine: Mod assist;+2 for physical assistance   General bed mobility comments: use of rail and attempting log roll  Transfers Overall transfer level: Needs assistance Equipment used: 2 person hand held assist Transfers: Sit to/from Stand Sit to Stand: Min assist;+2 physical assistance              Balance Overall balance assessment: Needs assistance Sitting-balance support: No upper extremity supported;Feet supported Sitting balance-Leahy Scale: Good Sitting balance - Comments: supervision   Standing balance support: Bilateral upper extremity  supported Standing balance-Leahy Scale: Fair Standing balance comment: RW                           ADL either performed or assessed with clinical judgement   ADL Overall ADL's : Needs assistance/impaired Eating/Feeding: Set up;Sitting   Grooming: Min guard;Standing   Upper Body Bathing: Min guard;Standing   Lower Body Bathing: Moderate assistance;Sitting/lateral leans;Sit to/from stand;Cueing for safety;With adaptive equipment   Upper Body Dressing : Minimal assistance;Sitting   Lower Body Dressing: Moderate assistance;Cueing for safety;Sitting/lateral leans;Sit to/from stand   Toilet Transfer: Min guard;Ambulation;RW   Toileting- Clothing Manipulation and Hygiene: Moderate assistance;Cueing for safety;Sit to/from stand;Sitting/lateral lean       Functional mobility during ADLs: Min guard;Cueing for safety;Rolling walker General ADL Comments: Pt limited be decreased activity tolerance, decreased ability to care for self and decreased strength.     Vision Baseline Vision/History: Wears glasses Wears Glasses: At all times Patient Visual Report: No change from baseline Vision Assessment?: No apparent visual deficits     Perception     Praxis      Pertinent Vitals/Pain Pain Assessment: 0-10 Pain Score: 9  Pain Location: abdomen Pain Descriptors / Indicators: Burning Pain Intervention(s): Limited activity within patient's tolerance     Hand Dominance     Extremity/Trunk Assessment Upper Extremity Assessment Upper Extremity Assessment: Overall WFL for tasks assessed   Lower Extremity Assessment Lower Extremity Assessment: Defer to PT evaluation;Generalized weakness   Cervical / Trunk Assessment Cervical / Trunk Assessment: Other exceptions(scoliosis) Cervical / Trunk Exceptions: scoliosis   Communication Communication Communication: No difficulties   Cognition Arousal/Alertness: Awake/alert Behavior During Therapy: WFL for tasks  assessed/performed Overall Cognitive Status: Within Functional Limits for tasks assessed                                     General Comments  VSS on RA    Exercises     Shoulder Instructions      Home Living Family/patient expects to be discharged to:: Private residence Living Arrangements: Children Available Help at Discharge: Family;Available 24 hours/day Type of Home: House Home Access: Stairs to enter [employer] of Steps: 2 Entrance Stairs-Rails: Right Home Layout: One level     Bathroom Shower/Tub: Occupational psychologist: Standard     Home Equipment: Grab bars - tub/shower          Prior Functioning/Environment Level of Independence: Independent                 OT Problem List: Decreased strength;Decreased activity tolerance;Impaired balance (sitting and/or standing);Decreased safety awareness;Pain      OT Treatment/Interventions: Self-care/ADL training;Therapeutic exercise;Energy conservation;DME and/or AE instruction;Therapeutic activities;Patient/family education;Balance training    OT Goals(Current goals can be found in the care plan section) Acute Rehab OT Goals Patient Stated Goal: To return to independent mobility OT Goal Formulation: With patient Time For Goal Achievement: 09/12/19 Potential to Achieve Goals: Good ADL Goals Pt Will Perform Grooming: with supervision;standing Pt Will Perform Lower Body Dressing: with min guard assist;with adaptive equipment;sitting/lateral leans;sit to/from stand Pt Will Perform Toileting - Clothing Manipulation and hygiene: with adaptive equipment;with supervision;sitting/lateral leans;sit to/from stand Pt/caregiver will Perform Home Exercise Program: Increased strength;Both right and left upper extremity;With Supervision Additional ADL Goal #1: Pt will increase to [place] level for OOB ADL.  OT Frequency: Min 2X/week   Barriers to D/C:            Co-evaluation  PT/OT/SLP Co-Evaluation/Treatment: Yes Reason for Co-Treatment: Complexity of the patient's impairments (multi-system involvement)   OT goals addressed during session: ADL's and self-care      AM-PAC OT "6 Clicks" Daily Activity     Outcome Measure Help from another person eating meals?: None Help from another person taking care of personal grooming?: A Little Help from another person toileting, which includes using toliet, bedpan, or urinal?: A Lot Help from another person bathing (including washing, rinsing, drying)?: A Lot Help from another person to put on and taking off regular upper body clothing?: A Little Help from another person to put on and taking off regular lower body clothing?: A Lot 6 Click Score: 16   End of Session Equipment Utilized During Treatment: Rolling walker Nurse Communication: Mobility status  Activity Tolerance: Patient tolerated treatment well;Patient limited by pain Patient left: in bed;with call bell/phone within reach;with bed alarm set;with family/visitor present  OT Visit Diagnosis: Unsteadiness on feet (R26.81);Muscle weakness (generalized) (M62.81);Pain Pain - part of body: (abdomen)                Time: [id number] OT Time Calculation (min): 29 min Charges:  OT General Charges $OT Visit: 1 Visit OT Evaluation $OT Eval Moderate Complexity: 1 Mod  Jefferey Pica, OTR/L Acute Rehabilitation Services Pager: [phone] Office: [phone]   Sanaiya Welliver C 08/29/2019, 5:07 PM

## 2019-08-29 NOTE — Progress Notes (Signed)
Patient arrived to 4E room 23 at this time. CCMD notified. V/s and assesment complete. Patient oriented to room and how to call nurse with any needs.   Emelda Fear, RN

## 2019-08-29 NOTE — Evaluation (Signed)
Physical Therapy Evaluation Patient Details Name: Abigail Cohen MRN: [id number] DOB: Jul 24, 1940 Today's Date: 08/29/2019   History of Present Illness  79 y.o. female who I saw on 06/27/2019 with a 5.6 cm infrarenal abdominal aortic aneurysm. Pt with PMH including HTN, OA, uterine prolapse. Pt underwent Open abdominal aortic aneurysm repair with aortobiiliac graft on 2/23.  Clinical Impression  Pt presents to PT with deficits in functional mobility, gait, balance, endurance, power, and with significant abdominal pain. Pt requiring significant assistance for bed mobility at this time and is generally limited by pain during all OOB activity, although mobilizing well OOB at this time. Pt will benefit from aggressive mobilization to restore independence in mobility and ADLs.    Follow Up Recommendations Home health PT;Supervision/Assistance - 24 hour    Equipment Recommendations  Rolling walker with 5" wheels    Recommendations for Other Services       Precautions / Restrictions Precautions Precautions: Fall Restrictions Weight Bearing Restrictions: No      Mobility  Bed Mobility Overal bed mobility: Needs Assistance Bed Mobility: Rolling;Sidelying to Sit;Sit to Supine Rolling: Min assist Sidelying to sit: Mod assist   Sit to supine: Mod assist;+2 for physical assistance      Transfers Overall transfer level: Needs assistance Equipment used: 2 person hand held assist Transfers: Sit to/from Stand Sit to Stand: Min assist;+2 physical assistance            Ambulation/Gait Ambulation/Gait assistance: Min guard Gait Distance (Feet): 20 Feet Assistive device: Rolling walker (2 wheeled) Gait Pattern/deviations: Step-to pattern Gait velocity: reduced Gait velocity interpretation: <1.8 ft/sec, indicate of risk for recurrent falls General Gait Details: pt with slowed step to gait, reduced stride length  Stairs            Wheelchair Mobility    Modified Rankin  (Stroke Patients Only)       Balance Overall balance assessment: Needs assistance Sitting-balance support: No upper extremity supported;Feet supported Sitting balance-Leahy Scale: Good Sitting balance - Comments: supervision   Standing balance support: Bilateral upper extremity supported Standing balance-Leahy Scale: Fair Standing balance comment: minG with BUE support of RW                             Pertinent Vitals/Pain Pain Assessment: 0-10 Pain Score: 9  Pain Location: abdomen Pain Descriptors / Indicators: Burning Pain Intervention(s): Limited activity within patient's tolerance    Home Living Family/patient expects to be discharged to:: Private residence Living Arrangements: Children Available Help at Discharge: Family;Available 24 hours/day Type of Home: House Home Access: Stairs to enter Entrance Stairs-Rails: Right Entrance Stairs-Number of Steps: 2 Home Layout: One level Home Equipment: Grab bars - tub/shower      Prior Function Level of Independence: Independent               Hand Dominance        Extremity/Trunk Assessment   Upper Extremity Assessment Upper Extremity Assessment: Defer to OT evaluation    Lower Extremity Assessment Lower Extremity Assessment: Overall WFL for tasks assessed    Cervical / Trunk Assessment Cervical / Trunk Assessment: Other exceptions(scoliosis)  Communication   Communication: No difficulties  Cognition Arousal/Alertness: Awake/alert Behavior During Therapy: WFL for tasks assessed/performed Overall Cognitive Status: Within Functional Limits for tasks assessed  General Comments General comments (skin integrity, edema, etc.): VSS on RA    Exercises     Assessment/Plan    PT Assessment Patient needs continued PT services  PT Problem List Decreased activity tolerance;Decreased balance;Decreased mobility;Decreased knowledge of use of  DME;Decreased knowledge of precautions;Pain       PT Treatment Interventions DME instruction;Gait training;Stair training;Functional mobility training;Therapeutic activities;Therapeutic exercise;Balance training;Neuromuscular re-education;Patient/family education    PT Goals (Current goals can be found in the Care Plan section)  Acute Rehab PT Goals Patient Stated Goal: To return to independent mobility PT Goal Formulation: With patient Time For Goal Achievement: 09/12/19 Potential to Achieve Goals: Good    Frequency Min 3X/week   Barriers to discharge        Co-evaluation PT/OT/SLP Co-Evaluation/Treatment: Yes Reason for Co-Treatment: Complexity of the patient's impairments (multi-system involvement);Necessary to address cognition/behavior during functional activity;For patient/therapist safety;To address functional/ADL transfers PT goals addressed during session: Mobility/safety with mobility;Balance;Proper use of DME;Strengthening/ROM         AM-PAC PT "6 Clicks" Mobility  Outcome Measure Help needed turning from your back to your side while in a flat bed without using bedrails?: A Little Help needed moving from lying on your back to sitting on the side of a flat bed without using bedrails?: A Lot Help needed moving to and from a bed to a chair (including a wheelchair)?: A Little Help needed standing up from a chair using your arms (e.g., wheelchair or bedside chair)?: A Little Help needed to walk in hospital room?: A Little Help needed climbing 3-5 steps with a railing? : A Lot 6 Click Score: 16    End of Session   Activity Tolerance: Patient limited by pain Patient left: in bed;with call bell/phone within reach;with family/visitor present Nurse Communication: Mobility status PT Visit Diagnosis: Pain;Other abnormalities of gait and mobility (R26.89)    Time: [id number] PT Time Calculation (min) (ACUTE ONLY): 28 min   Charges:   PT Evaluation $PT Eval Moderate  Complexity: 1 Mod          Zenaida Niece, PT, DPT Acute Rehabilitation Pager: [phone]   Zenaida Niece 08/29/2019, 12:19 PM

## 2019-08-29 NOTE — Progress Notes (Signed)
   VASCULAR SURGERY ASSESSMENT & PLAN:   POD 1 OPEN AAA REPAIR: Doing excellent.  Has palpable pedal pulses.  CARDIAC: Hemodynamically stable.  Discontinue central line  PULMONARY: IS.  Increase activity as tolerated.  GI/NUTRITION: Discontinue NG tube but keep n.p.o. until passing flatus and bowel function returns  RENAL: Her renal function is normal.  She had a infrarenal clamp  VASCULAR QUALITY INITIATIVE: Restart aspirin and her statin once she is taking p.o.  DVT PROPHYLAXIS: She is on subcu heparin  Transfer to 4 E.   SUBJECTIVE:   No complaints.  No nausea.  Pain under good control.  PHYSICAL EXAM:   Vitals:   08/29/19 0413 08/29/19 0500 08/29/19 0600 08/29/19 0700  BP: (!) 100/48 (!) 101/49 (!) 102/53 107/68  Pulse: (!) 108 99 98 (!) 103  Resp: 19 13 20  (!) 27  Temp:      TempSrc:      SpO2: 92% 93% 93% 96%  Weight:   48.2 kg   Height:       NG tube only drained 50 cc 4 L positive yesterday  LUNGS: Good air exchange bilaterally ABDOMEN: Soft.  Incision looks fine.  No bowel sounds yet. VASCULAR: Palpable pedal pulses. Mild bilateral lower extremity swelling which was present preop.  LABS:   Lab Results  Component Value Date   WBC 9.5 08/29/2019   HGB 9.4 (L) 08/29/2019   HCT 28.6 (L) 08/29/2019   MCV 98.6 08/29/2019   PLT 96 (L) 08/29/2019   Lab Results  Component Value Date   CREATININE 0.60 08/29/2019    PROBLEM LIST:    Active Problems:   Abdominal aortic aneurysm (AAA) (HCC)   CURRENT MEDS:   . Chlorhexidine Gluconate Cloth  6 each Topical Daily  . [START ON 08/30/2019] heparin  5,000 Units Subcutaneous Q8H  . mupirocin ointment  1 application Nasal BID  . pantoprazole  40 mg Oral Daily  . sodium chloride flush  10-40 mL Intracatheter Q12H    [name] Office: [phone] 08/29/2019

## 2019-08-30 MED ORDER — BISACODYL 10 MG RE SUPP
10.0000 mg | Freq: Once | RECTAL | Status: AC
  Administered 2019-08-30: 10 mg via RECTAL
  Filled 2019-08-30: qty 1

## 2019-08-30 NOTE — TOC Initial Note (Signed)
Transition of Care [facility]) - Initial/Assessment Note    Patient Details  Name: Abigail Cohen MRN: [id number] Date of Birth: Oct 20, 1940  Transition of Care [facility]) CM/SW Contact:    Curlene Labrum, RN Phone Number: 08/30/2019, 3:05 PM  Clinical Narrative:         Patient presented to [facility] with AAA repair by Dr. Scot Dock.  Daughter, Sonia Baller, is present in the room with the patient upon assessment with Case Management.  Patient states she is pleased and her goal is to heal and go home after her surgery.  Patient lives with her daughter, Sonia Baller at the present address that is listed in the face-sheet contact information.  The daughter lives at her mother's home at this address and will be providing 24 hour care and supervision to the patient.  Patient and daughter offered medicare choice home health information.  The patient states she was previously set up with [facility] by Dr. Nicole Cella physician practice and patient and daughter in agreement for the PT and OT referral. [facility] aware of the needed services when the patient is discharged home.  Patient states she currently has all needed home DME equipment including a shower grab bar, shower seat, rolling walker (5 in wheels) and 3:1 commode as recommended by therapy.   The daughter states she will be purchasing long handle shoe horn and long stick grabber for home at a later date near discharge.      Patient currently uses [facility] in [place].  The patient confirmed PCP and cardiology physician practices.  She also confirmed that the daughter, Sonia Baller, will be driving the patient home upon discharge.  No further needs were identified during the case management assessment.    Expected Discharge Plan: [place] Barriers to Discharge: No Barriers Identified   Patient Goals and CMS Choice Patient states their goals for this hospitalization and ongoing recovery are:: "I'm very excited to  have my aneurism fixed and I'll be ready to go home when the physician lets me" CMS Medicare.gov Compare Post Acute Care list provided to:: Patient Choice offered to / list presented to : Patient  Expected Discharge Plan and Services Expected Discharge Plan: [place]   Discharge Planning Services: CM Consult Post Acute Care Choice: [place] arrangements for the past 2 months: Single Family Home                 DME Arranged: (daughter states RW with 5 in wheels and 3:1 already delivered and in the car.)         HH Arranged: PT, OT(previously arranged with [facility] prior to surgery by Dr. Scot Dock, the physician's practice) [place] Agency: [facility] [place]        Prior Living Arrangements/Services Living arrangements for the past 2 months: [place] Lives with:: Adult Children Patient language and need for interpreter reviewed:: Yes Do you feel safe going back to the place where you live?: Yes      Need for Family Participation in Patient Care: Yes (Comment) Care giver support system in place?: Yes (comment) Current home services: DME Criminal Activity/Legal Involvement Pertinent to Current Situation/Hospitalization: No - Comment as needed  Activities of Daily Living      Permission Sought/Granted Permission sought to share information with : Case Manager Permission granted to share information with : Yes, Verbal Permission Granted     Permission granted to share info w AGENCY: [facility]  Permission granted to share info w Contact Information: Sonia Baller, daughter  Emotional Assessment Appearance:: Appears stated age, Appears younger than stated age Attitude/Demeanor/Rapport: Engaged, Self-Confident Affect (typically observed): Accepting, Adaptable, Appropriate Orientation: : Oriented to Self, Oriented to Place, Oriented to  Time, Oriented to Situation Alcohol / Substance Use: Not Applicable Psych Involvement: No  (comment)  Admission diagnosis:  Abdominal aortic aneurysm (AAA) [facility]) [I71.4] Patient Active Problem List   Diagnosis Date Noted  . Abdominal aortic aneurysm (AAA) ([place]) 08/28/2019  . Osteoporosis 08/02/2016  . Left inguinal hernia 06/20/2012   PCP:  Wenda Low, MD Pharmacy:   [facility] [place], [place] [address] AT [address] [place] [address] Phone: [phone] Fax: [phone]     Social Determinants of Health (SDOH) Interventions    Readmission Risk Interventions No flowsheet data found.

## 2019-08-30 NOTE — Progress Notes (Signed)
Physical Therapy Treatment Patient Details Name: Abigail Cohen MRN: [id number] DOB: Dec 02, 1940 Today's Date: 08/30/2019    History of Present Illness 79 y.o. female who I saw on 06/27/2019 with a 5.6 cm infrarenal abdominal aortic aneurysm. Pt with PMH including HTN, OA, uterine prolapse. Pt underwent Open abdominal aortic aneurysm repair with aortobiiliac graft on 2/23.    PT Comments    Patient is progressing well toward PT goals and tolerated increased mobility well this session. Current plan remains appropriate.    Follow Up Recommendations  Home health PT;Supervision/Assistance - 24 hour     Equipment Recommendations  Rolling walker with 5" wheels;Other (comment)(youth size RW)    Recommendations for Other Services       Precautions / Restrictions Precautions Precautions: Fall Precaution Comments: abdominal incision Restrictions Weight Bearing Restrictions: No    Mobility  Bed Mobility               General bed mobility comments: pt OOB in chair upon arrival   Transfers Overall transfer level: Needs assistance Equipment used: Rolling walker (2 wheeled) Transfers: Sit to/from Stand Sit to Stand: Min assist         General transfer comment: cues for hand placement; assist to power up into standing  Ambulation/Gait Ambulation/Gait assistance: Min guard Gait Distance (Feet): 300 Feet Assistive device: Rolling walker (2 wheeled) Gait Pattern/deviations: Step-through pattern;Drifts right/left     General Gait Details: grossly steady gait and good gait speed; pt tends to drift R and L    Stairs             Wheelchair Mobility    Modified Rankin (Stroke Patients Only)       Balance Overall balance assessment: Needs assistance Sitting-balance support: No upper extremity supported;Feet supported Sitting balance-Leahy Scale: Good     Standing balance support: Bilateral upper extremity supported Standing balance-Leahy Scale: Poor Standing  balance comment: RW                            Cognition Arousal/Alertness: Awake/alert Behavior During Therapy: WFL for tasks assessed/performed Overall Cognitive Status: Within Functional Limits for tasks assessed                                        Exercises      General Comments        Pertinent Vitals/Pain Pain Assessment: Faces Faces Pain Scale: Hurts a little bit Pain Location: abdomen Pain Descriptors / Indicators: Guarding;Sore Pain Intervention(s): Monitored during session;Repositioned    Home Living                      Prior Function            PT Goals (current goals can now be found in the care plan section) Acute Rehab PT Goals Patient Stated Goal: To return to independent mobility Progress towards PT goals: Progressing toward goals    Frequency    Min 3X/week      PT Plan Current plan remains appropriate    Co-evaluation              AM-PAC PT "6 Clicks" Mobility   Outcome Measure  Help needed turning from your back to your side while in a flat bed without using bedrails?: A Little Help needed moving from lying on your back to sitting  on the side of a flat bed without using bedrails?: A Lot Help needed moving to and from a bed to a chair (including a wheelchair)?: A Little Help needed standing up from a chair using your arms (e.g., wheelchair or bedside chair)?: A Little Help needed to walk in hospital room?: A Little Help needed climbing 3-5 steps with a railing? : A Lot 6 Click Score: 16    End of Session   Activity Tolerance: Patient tolerated treatment well Patient left: with call bell/phone within reach;in chair;with family/visitor present Nurse Communication: Mobility status PT Visit Diagnosis: Pain;Other abnormalities of gait and mobility (R26.89)     Time: [id number] PT Time Calculation (min) (ACUTE ONLY): 23 min  Charges:  $Gait Training: 23-37 mins                     Earney Navy, PTA Acute Rehabilitation Services Pager: [phone] Office: [phone]     Darliss Cheney 08/30/2019, 2:31 PM

## 2019-08-30 NOTE — Progress Notes (Addendum)
  AAA Progress Note  VASCULAR SURGERY ASSESSMENT & PLAN:   POD 2 OPEN AAA REPAIR: Doing excellent.  Has palpable pedal pulses.  PULMONARY:  Doing excellent with her IS.  Increase activity as tolerated.  GI/NUTRITION: No BM or flatus. Abdomen quiet. Dulcolax.  Will not start diet until bowel function returns.  RENAL: Her renal function is normal.  She had a infrarenal clamp  VASCULAR QUALITY INITIATIVE: Restart aspirin and her statin once she is taking p.o.  DVT PROPHYLAXIS: She is on subcu heparin  ACTIVITY: Ambulate in halls  THROMBOCYTOPENIA: Platelet count 96,000 yesterday.  Follow-up labs this morning pending  [name], MD Office: [phone]    08/30/2019 7:21 AM 2 Days Post-Op  Subjective:  No complaints; sat in the chair a lot yesterday.  Burping a lot but no flatus or BM.  Tm 99.7 now afebrile HR 90's-110's NSR [id number] systolic Q000111Q RA  Gtts:  None at this time  Vitals:   08/29/19 2313 08/30/19 0605  BP: (!) 94/52 (!) 115/57  Pulse: (!) 105 (!) 106  Resp: 18 19  Temp: 99.7 F (37.6 C) 98.8 F (37.1 C)  SpO2: 91% 93%    Physical Exam: Cardiac:  regular Lungs:  Non labored Abdomen:  Soft, -flatus; -BM Incisions:  Clean and dry and healing nicely Extremities:  Easily palpable DP pulses bilaterally General:  Overall looks good and comfortable.  Working on IS   Intake/Output Summary (Last 24 hours) at 08/30/2019 0721 Last data filed at 08/30/2019 0459 Gross per 24 hour  Intake 334.74 ml  Output 400 ml  Net -65.26 ml     Assessment/Plan:  79 y.o. female is s/p  Open AAA repair 2 Days Post-Op  -Vascular:  Pt with excellent palpable DP pulses bilaterally -Cardiac:  Pt somewhat tachycardic (baseline on addmsion 70's) and BP is around baseline.  May benefit from fluid bolus.   -Pulmonary:  O2 sats 93% RA-continue to mobilize and walk in hallways today.  Continue IS every hour. -Neuro:  In tact -Renal:  BUN/Cr normal yesterday.  UOP  not recorded first shift yesterday. -GI:  Abdomen is soft and non tender.  She is burping, but no flatus or BM yet.  Will give dulcolax this am.  Continue ambulating as well.  -Incisions:  Laparotomy incision looks good and healing nicely.  -Heme/ID:  Tm 99.7 overnight-most likely atelectasis.  Continue IS and walking.  Thrombocytopenia-platelets were 96k yesterday.  Will check labs tomorrow.   -General:  Overall doing well.  Once bowel function returns and she is tolerating diet, will discharge.    Disposition:  Will get HHPT/OT and DME equipment ordered (3n1 and RW).  Face to face and HH orders placed.     Leontine Locket, PA-C [facility] [phone] 08/30/2019 7:21 AM

## 2019-08-30 NOTE — Progress Notes (Signed)
Occupational Therapy Treatment Patient Details Name: PINKEY KILLEEN MRN: [id number] DOB: [birth year] Today's Date: 08/30/2019    History of present illness 79 y.o. 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 Pt with PMH including HTN, OA, uterine prolapse. Pt underwent Open abdominal aortic aneurysm repair with aortobiiliac graft on 2/23.   OT comments  Patient seated in recliner upon arrival, agreeable to OT. Provide demo of LB adaptive equipment for dressing including reacher, sock aid and shoe horn. Patient return demo with increase time for set up of sock aid and min cues for technique. Also discuss with patient + daughter regarding purchase of compression sock aid as patient reports she wears these at home and had some difficulty donning before surgery. Patient min guard assist with functional transfer to toilet, and min guard with ambulation using rolling walker. Pt require min cues for body mechanics during transfer. Patient request increased time to use toilet, instruct patient and daughter to pull cord when patient is finished.   Follow Up Recommendations  Home health OT;Supervision/Assistance - 24 hour    Equipment Recommendations  3 in 1 bedside commode;Other (comment)(Long handle adaptive equipment)       Precautions / Restrictions Precautions Precautions: Fall Precaution Comments: abdominal incision Restrictions Weight Bearing Restrictions: No       Mobility Bed Mobility               General bed mobility comments: seated in chair upon arrival  Transfers Overall transfer level: Needs assistance Equipment used: Rolling walker (2 wheeled) Transfers: Sit to/from Stand Sit to Stand: Min guard         General transfer comment: verbal cues for sequencing and body mechanics    Balance Overall balance assessment: Needs assistance Sitting-balance support: No upper extremity supported;Feet supported Sitting balance-Leahy Scale: Good     Standing balance support: Bilateral upper extremity supported;During functional activity Standing balance-Leahy Scale: Poor Standing balance comment: RW                           ADL either performed or assessed with clinical judgement   ADL Overall ADL's : Needs assistance/impaired                     Lower Body Dressing: Min guard;Sitting/lateral leans Lower Body Dressing Details (indicate cue type and reason): educated on LB dressing AE. patient return demo with min cues for technique use of reacher and sock aid. Also discuss use of compression sock aid/how to purchase as patient states she wears them at home.  Toilet Transfer: Min guard;Ambulation;RW;Comfort height toilet Toilet Transfer Details (indicate cue type and reason): min cues for body mechanics         Functional mobility during ADLs: Min guard;Cueing for safety;Rolling walker                 Cognition Arousal/Alertness: Awake/alert Behavior During Therapy: WFL for tasks assessed/performed Overall Cognitive Status: Within Functional Limits for tasks assessed                                                     Pertinent Vitals/ Pain       Pain Assessment: Faces Faces Pain Scale: Hurts a little bit Pain Location: abdomen Pain Descriptors / Indicators: Burning Pain Intervention(s):  Limited activity within patient's tolerance         Frequency  Min 2X/week        Progress Toward Goals  OT Goals(current goals can now be found in the care plan section)  Progress towards OT goals: Progressing toward goals  Acute Rehab OT Goals Patient Stated Goal: To return to independent mobility OT Goal Formulation: With patient Time For Goal Achievement: 09/12/19 Potential to Achieve Goals: Good ADL Goals Pt Will Perform Grooming: with supervision;standing Pt Will Perform Lower Body Dressing: with min guard assist;with adaptive equipment;sitting/lateral leans;sit  to/from stand Pt Will Perform Toileting - Clothing Manipulation and hygiene: with adaptive equipment;with supervision;sitting/lateral leans;sit to/from stand Pt/caregiver will Perform Home Exercise Program: Increased strength;Both right and left upper extremity;With Supervision Additional ADL Goal #1: Pt will increase to [place] level for OOB ADL.  Plan Discharge plan remains appropriate       AM-PAC OT "6 Clicks" Daily Activity     Outcome Measure   Help from another person eating meals?: None Help from another person taking care of personal grooming?: A Little Help from another person toileting, which includes using toliet, bedpan, or urinal?: A Little Help from another person bathing (including washing, rinsing, drying)?: A Lot Help from another person to put on and taking off regular upper body clothing?: A Little Help from another person to put on and taking off regular lower body clothing?: A Little 6 Click Score: 18    End of Session Equipment Utilized During Treatment: Rolling walker;Other (comment)(AE)  OT Visit Diagnosis: Unsteadiness on feet (R26.81);Muscle weakness (generalized) (M62.81);Pain Pain - part of body: (abdomen)   Activity Tolerance Patient tolerated treatment well   Patient Left with family/visitor present;Other (comment)(on the toilet)   Nurse Communication Mobility status        Time: [id number] OT Time Calculation (min): 25 min  Charges: OT General Charges $OT Visit: 1 Visit OT Treatments $Self Care/Home Management : 23-37 mins  Gans OT office: [place] 08/30/2019, 2:21 PM

## 2019-08-31 LAB — CBC
HCT: 25 % — ABNORMAL LOW (ref 36.0–46.0)
Hemoglobin: 8.2 g/dL — ABNORMAL LOW (ref 12.0–15.0)
MCH: 32 pg (ref 26.0–34.0)
MCHC: 32.8 g/dL (ref 30.0–36.0)
MCV: 97.7 fL (ref 80.0–100.0)
Platelets: 67 10*3/uL — ABNORMAL LOW (ref 150–400)
RBC: 2.56 MIL/uL — ABNORMAL LOW (ref 3.87–5.11)
RDW: 13.8 % (ref 11.5–15.5)
WBC: 7.2 10*3/uL (ref 4.0–10.5)
nRBC: 0 % (ref 0.0–0.2)

## 2019-08-31 LAB — BASIC METABOLIC PANEL
Anion gap: 7 (ref 5–15)
BUN: 6 mg/dL — ABNORMAL LOW (ref 8–23)
CO2: 23 mmol/L (ref 22–32)
Calcium: 7.1 mg/dL — ABNORMAL LOW (ref 8.9–10.3)
Chloride: 104 mmol/L (ref 98–111)
Creatinine, Ser: 0.57 mg/dL (ref 0.44–1.00)
GFR calc Af Amer: >60 mL/min (ref >60)
GFR calc non Af Amer: >60 mL/min (ref >60)
Glucose, Bld: 108 mg/dL — ABNORMAL HIGH (ref 70–99)
Potassium: 3.1 mmol/L — ABNORMAL LOW (ref 3.5–5.1)
Sodium: 134 mmol/L — ABNORMAL LOW (ref 135–145)

## 2019-08-31 MED ORDER — FLEET ENEMA 7-19 GM/118ML RE ENEM
1.0000 | ENEMA | Freq: Once | RECTAL | Status: AC
  Administered 2019-08-31: 1 via RECTAL
  Filled 2019-08-31: qty 1

## 2019-08-31 MED FILL — Heparin Sodium (Porcine) Inj 1000 Unit/ML: INTRAMUSCULAR | Qty: 30 | Status: AC

## 2019-08-31 MED FILL — Sodium Chloride Irrigation Soln 0.9%: Qty: 3000 | Status: AC

## 2019-08-31 MED FILL — Sodium Chloride IV Soln 0.9%: INTRAVENOUS | Qty: 1000 | Status: AC

## 2019-08-31 NOTE — Progress Notes (Addendum)
  AAA Progress Note  VASCULAR SURGERY ASSESSMENT & PLAN:   POD 3 OPEN AAA REPAIR:Doing well. Has palpable pedal pulses.  GI/NUTRITION: Passing Flatus. For enema today. Start fulls.   RENAL: Her renal function is normal. She had a infrarenal clamp  ACUTE BLOOD LOSS ANEMIA: Hgb 8.2. F/U CBC tomorrow.   VASCULAR QUALITY INITIATIVE: Restart aspirin and her statin once she is taking p.o.  DVT PROPHYLAXIS: SCD's. Stopped heparin bc of thrombocytopenia.   ACTIVITY: Ambulate in halls  THROMBOCYTOPENIA: Platelet count 67,000. Hold all heparin.   [name], MD Office: [phone]  08/31/2019 7:23 AM 3 Days Post-Op  Subjective:  Says she may have passed a little bit of gas but no BM.  Says she has to use her finger sometimes at home bc she has difficulty.  Tm 99.4 now afebrile HR 90's-130's NSR/ST [id number] - Q000111Q systolic  99991111 RA  Gtts:  none  Vitals:   08/31/19 0307 08/31/19 0400  BP: 121/69   Pulse: (!) 115 96  Resp: 18   Temp: 98.4 F (36.9 C)   SpO2: 96% 93%    Physical Exam: Cardiac:  tachy Lungs:  Non labored Abdomen:  Soft, NT/ND; +flatus Incisions:  Clean and dry Extremities:  Palpable DP pulses bilaterally General:  No distress; working on IS  CBC    Component Value Date/Time   WBC 7.2 08/31/2019 0232   RBC 2.56 (L) 08/31/2019 0232   HGB 8.2 (L) 08/31/2019 0232   HCT 25.0 (L) 08/31/2019 0232   PLT 67 (L) 08/31/2019 0232   MCV 97.7 08/31/2019 0232   MCH 32.0 08/31/2019 0232   MCHC 32.8 08/31/2019 0232   RDW 13.8 08/31/2019 0232    BMET    Component Value Date/Time   NA 134 (L) 08/31/2019 0232   K 3.1 (L) 08/31/2019 0232   CL 104 08/31/2019 0232   CO2 23 08/31/2019 0232   GLUCOSE 108 (H) 08/31/2019 0232   BUN 6 (L) 08/31/2019 0232   CREATININE 0.57 08/31/2019 0232   CREATININE 0.69 08/02/2018 1129   CALCIUM 7.1 (L) 08/31/2019 0232   GFRNONAA >60 08/31/2019 0232   GFRNONAA 82 08/02/2018 1129   GFRAA >60 08/31/2019 0232   GFRAA  95 08/02/2018 1129    INR    Component Value Date/Time   INR 1.4 (H) 08/28/2019 1238     Intake/Output Summary (Last 24 hours) at 08/31/2019 0723 Last data filed at 08/31/2019 [id number] Gross per 24 hour  Intake 900 ml  Output 350 ml  Net 550 ml     Assessment/Plan:  79 y.o. female is s/p  Open AAA repair 3 Days Post-Op  -Vascular:  Palpable DP pulses bilaterally -Cardiac:  Hemodynamically stable; tachy -Pulmonary:  Non labored on room air -Neuro:  In tact -Renal:  Normal BUN/creatinine -GI:  Minimal flatus and no BM.  Has difficulty having BM at home; will order enema -Incisions:  Healing nicely -Heme/ID:  Acute surgical blood loss anemia-hgb down to 8.2 from 9.4 2 days ago.  She does have some tachycardia-may benefit from a unit of PRBC's.  Will defer to Dr. Scot Dock.  Thrombocytopenia decreased again today to 67k from 96k.  Will order HIT and stop sq heparin.  Will need to continue SCD's when not walking.  -General:  No distress   Leontine Locket, PA-C [facility] [phone] 08/31/2019 7:23 AM

## 2019-08-31 NOTE — Discharge Instructions (Signed)
 [facility] [place]  Discharge Instructions   Open Aortic Surgery  Please refer to the following instructions for your post-procedure care. Your surgeon or Physician Assistant will discuss any changes with you.  Activity  Avoid lifting more than eight pounds (a gallon of milk) until after your first post-operative visit. You are encouraged to walk as much as you can. You can slowly return to normal activities but must avoid strenuous activity and heavy lifting until your doctor tells you it's okay. Heavy lifting can hurt the incision and cause a hernia. Avoid activities such as vacuuming or swinging a golf club. It is normal to feel tired for several weeks after your surgery. Do not drive until your doctor gives the okay and you are no longer taking prescription pain medications. It is also normal to have difficulty with sleep habits, eating and bowl movements after surgery. These will go away with time.  Bathing/Showering  Shower daily after you go home. Do not soak in a bathtub, hot tub, or swim until the incision heals.  Incision Care  Shower every day. Clean your incision with mild soap and water. Pat the area dry with a clean towel. You do not need a bandage unless otherwise instructed. Do not apply any ointments or creams to your incision. You may have skin glue on your incision. Do not peel it off. It will come off on its own in about one week. If you have staples or sutures along your incision, they will be removed at your post op appointment.  If you have groin incisions, wash the groin wounds with soap and water daily and pat dry. (No tub bath-only shower)  Then put a dry gauze or washcloth in the groin to keep this area dry to help prevent wound infection.  Do this daily and as needed.  Do not use Vaseline or neosporin on your incisions.  Only use soap and water on your incisions and then protect and keep dry.  Diet  Resume your normal diet. There are no  special food restriction following this procedure. A low fat/low cholesterol diet is recommended for all patients with vascular disease. After your aortic surgery, it's normal to feel full faster than usual and to not feel as hungry as you normally would. You will probably lose weight initially following your surgery. It's best to eat small, frequent meals over the course of the day. Call the office if you find that you are unable to eat even small meals.   In order to heal from your surgery, it is CRITICAL to get adequate nutrition. Your body requires vitamins, minerals, and protein. Vegetables are the best source of vitamins and minerals. If you have pain, you may take over-the-counter pain reliever such as acetaminophen (Tylenol). If you were prescribed a stronger pain medication, please be aware these medication can cause nausea and constipation. Prevent nausea by taking the medication with a snack or meal. Avoid constipation by drinking plenty of fluids and eating foods with a high amount of fiber, such as fruits, vegetables and grains. Take 100mg of the over-the-counter stool softener Colace twice a day as needed to help with constipation. A laxative, such as Milk of Magnesia, may be recommended for you at this time. Do not take a laxative unless your surgeon or P.A. tells you it's OK.  Do not take Tylenol if you are taking stronger pain medications (such as Percocet).  Follow Up  Our office will schedule a follow up   appointment 2-3 weeks after discharge.  Please call us immediately for any of the following conditions    .     Severe or worsening pain in your legs or feet or in your abdomen back or chest. Increased pain, redness drainage (pus) from your incision site. Increased abdominal pain, bloating, nausea, vomiting, or persistent diarrhea. Fever of 101 degrees or higher. Swelling in your leg (s).  Reduce your risk of vascular disease  Stop smoking. If you would like help, call  [facility] at 1-800-QUIT-NOW (1-800-784-8669) or [facility] at 336-586-4000. Manage your cholesterol Maintain a desired weight Control your diabetes Keep your blood pressure down  If you have any questions please call the office at [phone].   

## 2019-08-31 NOTE — Progress Notes (Signed)
Physical Therapy Treatment Patient Details Name: Abigail Cohen MRN: [id number] DOB: 07-May-1941 Today's Date: 08/31/2019    History of Present Illness 79 y.o. female who I saw on 06/27/2019 with a 5.6 cm infrarenal abdominal aortic aneurysm. Pt with PMH including HTN, OA, uterine prolapse. Pt underwent Open abdominal aortic aneurysm repair with aortobiiliac graft on 2/23.    PT Comments    Patient continues to make progress toward PT goals. Current plan remains appropriate.    Follow Up Recommendations  Home health PT;Supervision/Assistance - 24 hour     Equipment Recommendations  Rolling walker with 5" wheels;Other (comment)(youth size RW; already delivered)    Recommendations for Other Services       Precautions / Restrictions Precautions Precautions: Fall Precaution Comments: abdominal incision Restrictions Weight Bearing Restrictions: No    Mobility  Bed Mobility               General bed mobility comments: pt OOB in chair upon arrival   Transfers Overall transfer level: Needs assistance Equipment used: Rolling walker (2 wheeled) Transfers: Sit to/from Stand Sit to Stand: Min guard         General transfer comment: cues for hand placement  Ambulation/Gait Ambulation/Gait assistance: Min guard Gait Distance (Feet): 350 Feet Assistive device: Rolling walker (2 wheeled) Gait Pattern/deviations: Step-through pattern     General Gait Details: slow, steady gait   Stairs             Wheelchair Mobility    Modified Rankin (Stroke Patients Only)       Balance Overall balance assessment: Needs assistance Sitting-balance support: No upper extremity supported;Feet supported Sitting balance-Leahy Scale: Good     Standing balance support: Bilateral upper extremity supported Standing balance-Leahy Scale: Poor                              Cognition Arousal/Alertness: Awake/alert Behavior During Therapy: WFL for tasks  assessed/performed Overall Cognitive Status: Within Functional Limits for tasks assessed                                        Exercises      General Comments General comments (skin integrity, edema, etc.): HR in 120s with mobility      Pertinent Vitals/Pain Pain Assessment: Faces Faces Pain Scale: Hurts a little bit Pain Location: abdomen Pain Descriptors / Indicators: Guarding;Sore Pain Intervention(s): Monitored during session;Repositioned;Premedicated before session    Home Living                      Prior Function            PT Goals (current goals can now be found in the care plan section) Progress towards PT goals: Progressing toward goals    Frequency    Min 3X/week      PT Plan Current plan remains appropriate    Co-evaluation              AM-PAC PT "6 Clicks" Mobility   Outcome Measure  Help needed turning from your back to your side while in a flat bed without using bedrails?: A Little Help needed moving from lying on your back to sitting on the side of a flat bed without using bedrails?: A Lot Help needed moving to and from a bed to a chair (including a wheelchair)?:  A Little Help needed standing up from a chair using your arms (e.g., wheelchair or bedside chair)?: A Little Help needed to walk in hospital room?: A Little Help needed climbing 3-5 steps with a railing? : A Little 6 Click Score: 17    End of Session   Activity Tolerance: Patient tolerated treatment well Patient left: with call bell/phone within reach;in chair;with family/visitor present Nurse Communication: Mobility status PT Visit Diagnosis: Pain;Other abnormalities of gait and mobility (R26.89)     Time: [id number] PT Time Calculation (min) (ACUTE ONLY): 29 min  Charges:  $Gait Training: 23-37 mins                     Earney Navy, PTA Acute Rehabilitation Services Pager: [phone] Office: [phone]     Darliss Cheney 08/31/2019, 5:21 PM

## 2019-09-01 LAB — CBC
HCT: 22.9 % — ABNORMAL LOW (ref 36.0–46.0)
Hemoglobin: 7.7 g/dL — ABNORMAL LOW (ref 12.0–15.0)
MCH: 31.8 pg (ref 26.0–34.0)
MCHC: 33.6 g/dL (ref 30.0–36.0)
MCV: 94.6 fL (ref 80.0–100.0)
Platelets: 85 10*3/uL — ABNORMAL LOW (ref 150–400)
RBC: 2.42 MIL/uL — ABNORMAL LOW (ref 3.87–5.11)
RDW: 13.3 % (ref 11.5–15.5)
WBC: 6.4 10*3/uL (ref 4.0–10.5)
nRBC: 0 % (ref 0.0–0.2)

## 2019-09-01 LAB — HEPARIN INDUCED PLATELET AB (HIT ANTIBODY): Heparin Induced Plt Ab: 0.076 OD (ref 0.000–0.400)

## 2019-09-01 NOTE — Plan of Care (Signed)

## 2019-09-01 NOTE — Progress Notes (Signed)
  Progress Note    09/01/2019 2:47 PM 4 Days Post-Op  Subjective: Complains only of left forearm pain today Having normal bowel function Tolerating full liquids  Vitals:   09/01/19 0753 09/01/19 1240  BP: 110/73 122/71  Pulse: (!) 110 97  Resp: 17 17  Temp: 99.4 F (37.4 C) 98.4 F (36.9 C)  SpO2: 92% 96%    Physical Exam: Awake alert oriented Unlabored respirations Left forearm is soft no external abnormalities Abdomen is soft midline incision clean dry intact Bilateral feet are warm well perfused  CBC    Component Value Date/Time   WBC 6.4 09/01/2019 0331   RBC 2.42 (L) 09/01/2019 0331   HGB 7.7 (L) 09/01/2019 0331   HCT 22.9 (L) 09/01/2019 0331   PLT 85 (L) 09/01/2019 0331   MCV 94.6 09/01/2019 0331   MCH 31.8 09/01/2019 0331   MCHC 33.6 09/01/2019 0331   RDW 13.3 09/01/2019 0331    BMET    Component Value Date/Time   NA 134 (L) 08/31/2019 0232   K 3.1 (L) 08/31/2019 0232   CL 104 08/31/2019 0232   CO2 23 08/31/2019 0232   GLUCOSE 108 (H) 08/31/2019 0232   BUN 6 (L) 08/31/2019 0232   CREATININE 0.57 08/31/2019 0232   CREATININE 0.69 08/02/2018 1129   CALCIUM 7.1 (L) 08/31/2019 0232   GFRNONAA >60 08/31/2019 0232   GFRNONAA 82 08/02/2018 1129   GFRAA >60 08/31/2019 0232   GFRAA 95 08/02/2018 1129    INR    Component Value Date/Time   INR 1.4 (H) 08/28/2019 1238     Intake/Output Summary (Last 24 hours) at 09/01/2019 1447 Last data filed at 09/01/2019 0800 Gross per 24 hour  Intake 951.52 ml  Output --  Net 951.52 ml     Assessment/plan:  79 y.o. female is s/p open abdominal aortic aneurysm repair.  Subcutaneous heparin stopped for thrombocytopenia now rebounding with 85k platelets this morning.  Continue ambulation as tolerated and SCDs when in bed.  Advance to regular diet.   Terrell Ostrand C. Donzetta Matters, MD [facility] Office: [phone] Pager: [phone]  09/01/2019 2:47 PM

## 2019-09-02 MED ORDER — ASPIRIN EC 81 MG PO TBEC
81.0000 mg | DELAYED_RELEASE_TABLET | Freq: Every day | ORAL | Status: DC
  Administered 2019-09-02 – 2019-09-03 (×2): 81 mg via ORAL
  Filled 2019-09-02 (×2): qty 1

## 2019-09-02 MED ORDER — ROSUVASTATIN CALCIUM 5 MG PO TABS
10.0000 mg | ORAL_TABLET | Freq: Every day | ORAL | Status: DC
  Administered 2019-09-02 – 2019-09-03 (×2): 10 mg via ORAL
  Filled 2019-09-02 (×2): qty 2

## 2019-09-02 MED ORDER — DOCUSATE SODIUM 100 MG PO CAPS
100.0000 mg | ORAL_CAPSULE | Freq: Two times a day (BID) | ORAL | Status: DC | PRN
  Administered 2019-09-02 – 2019-09-03 (×2): 100 mg via ORAL
  Filled 2019-09-02 (×2): qty 1

## 2019-09-02 NOTE — Progress Notes (Addendum)
  Progress Note    09/02/2019 8:23 AM 5 Days Post-Op  Subjective:  Left forearm pain improved. Tolerating diet now. Has had BM. Has been ambulating in halls   Vitals:   09/02/19 0518 09/02/19 0752  BP: 125/72 116/68  Pulse: 100 (!) 101  Resp: 19 18  Temp: 98.8 F (37.1 C) 98 F (36.7 C)  SpO2: 99% 95%   Physical Exam: General: Well nourished, well appearing Lungs:  Non labored Incisions: abdoemen Clean, dry, intact Extremities:  Bilateral lower extremities well perfused, palpable DP bilaterally, feet warm. Left forearm soft, normal grip strength, left hand warm. Minimal tenderness Abdomen:  Mild distension but soft, non tender Neurologic: Alert and oriented  CBC    Component Value Date/Time   WBC 6.4 09/01/2019 0331   RBC 2.42 (L) 09/01/2019 0331   HGB 7.7 (L) 09/01/2019 0331   HCT 22.9 (L) 09/01/2019 0331   PLT 85 (L) 09/01/2019 0331   MCV 94.6 09/01/2019 0331   MCH 31.8 09/01/2019 0331   MCHC 33.6 09/01/2019 0331   RDW 13.3 09/01/2019 0331    BMET    Component Value Date/Time   NA 134 (L) 08/31/2019 0232   K 3.1 (L) 08/31/2019 0232   CL 104 08/31/2019 0232   CO2 23 08/31/2019 0232   GLUCOSE 108 (H) 08/31/2019 0232   BUN 6 (L) 08/31/2019 0232   CREATININE 0.57 08/31/2019 0232   CREATININE 0.69 08/02/2018 1129   CALCIUM 7.1 (L) 08/31/2019 0232   GFRNONAA >60 08/31/2019 0232   GFRNONAA 82 08/02/2018 1129   GFRAA >60 08/31/2019 0232   GFRAA 95 08/02/2018 1129    INR    Component Value Date/Time   INR 1.4 (H) 08/28/2019 1238     Intake/Output Summary (Last 24 hours) at 09/02/2019 [id number] Last data filed at 09/02/2019 0500 Gross per 24 hour  Intake 1812.62 ml  Output --  Net 1812.62 ml     Assessment/Plan:  79 y.o. female is s/p open abdominal aortic aneurysm repair 5 Days Post-Op. Platelets at 85K as of yesterday- this morning labs pending. Tolerating regular diet. Continue ambulation/ PT. Continue SCDs when not ambulating. Will restart her Aspirin  and statin  DVT prophylaxis:  SCDs   Karoline Caldwell, PA-C [facility] [phone] 09/02/2019 8:23 AM   I have independently interviewed and examined patient and agree with PA assessment and plan above.  Subcutaneous heparin is held given low platelets which were recovering yesterday.  She has on SCDs.  She is on aspirin and statin.  Kathrynne Kulinski C. Donzetta Matters, MD [facility] [place] Office: [phone] Pager: [phone]

## 2019-09-02 NOTE — Progress Notes (Signed)
Occupational Therapy Treatment Patient Details Name: Abigail Cohen MRN: [id number] DOB: [birth year] Today's Date: 09/02/2019    History of present illness 79 y.o. female who I saw on 06/27/2019 with a 5.6 cm infrarenal abdominal aortic aneurysm. Pt with PMH including HTN, OA, uterine prolapse. Pt underwent Open abdominal aortic aneurysm repair with aortobiiliac graft on 2/23.   OT comments  Pt. Seen for skilled OT treatment session.  Moving well at S level of assist during toileting and grooming tasks in room. Plans for purchase of A/E for LB ADL completion and having grab bars installed in b.room.  Reports dtr. Will also be staying with her initially once d/c home.  Next session review use of A/E for LB adl if needed and introduce HEP for UE strengthening.    Follow Up Recommendations  Home health OT;Supervision/Assistance - 24 hour    Equipment Recommendations  3 in 1 bedside commode;Other (comment)    Recommendations for Other Services      Precautions / Restrictions Precautions Precautions: Fall Precaution Comments: abdominal incision       Mobility Bed Mobility               General bed mobility comments: pt OOB in chair upon arrival   Transfers Overall transfer level: Needs assistance Equipment used: Rolling walker (2 wheeled) Transfers: Sit to/from [employer] Transfers Sit to Stand: Supervision Stand pivot transfers: Supervision            Balance                                           ADL either performed or assessed with clinical judgement   ADL Overall ADL's : Needs assistance/impaired     Grooming: Supervision/safety;Standing         Lower Body Bathing Details (indicate cue type and reason): reports A/E introduced at previous session and she has plans to purchase       Lower Body Dressing Details (indicate cue type and reason): reports A/E introduced at previous session and she has plans to purchase Toilet  Transfer: Supervision/safety;Ambulation;RW;Grab bars;Regular Museum/gallery exhibitions officer and Hygiene: Supervision/safety;Sit to/from stand     Tub/Shower Transfer Details (indicate cue type and reason): reports having a large shower stall at home. brother in law to install grab bars once [facility] comes to house and determines is she would benefit from vertical vs. horizontal ones Functional mobility during ADLs: Supervision/safety;Rolling walker General ADL Comments: pt. moving well at S level with use of RW.  Reviwed 3 uses of 3n1 for home. pt. visibly excited to know the 3n1 could be used for her shower chair.  will have dtr. with her initally upon d/c home.     Vision       Perception     Praxis      Cognition Arousal/Alertness: Awake/alert Behavior During Therapy: WFL for tasks assessed/performed Overall Cognitive Status: Within Functional Limits for tasks assessed                                          Exercises     Shoulder Instructions       General Comments      Pertinent Vitals/ Pain       Pain Assessment: 0-10 Pain Score: 7  Pain Location: abdomen Pain Descriptors / Indicators: Guarding;Sore Pain Intervention(s): Limited activity within patient's tolerance;Monitored during session;Premedicated before session;Repositioned  Home Living                                          Prior Functioning/Environment              Frequency  Min 2X/week        Progress Toward Goals  OT Goals(current goals can now be found in the care plan section)  Progress towards OT goals: Progressing toward goals     Plan Discharge plan remains appropriate    Co-evaluation                 AM-PAC OT "6 Clicks" Daily Activity     Outcome Measure   Help from another person eating meals?: None Help from another person taking care of personal grooming?: A Little Help from another person toileting, which includes using  toliet, bedpan, or urinal?: A Little Help from another person bathing (including washing, rinsing, drying)?: A Lot Help from another person to put on and taking off regular upper body clothing?: A Little Help from another person to put on and taking off regular lower body clothing?: A Little 6 Click Score: 18    End of Session Equipment Utilized During Treatment: Rolling walker;Other (comment);Gait belt  OT Visit Diagnosis: Unsteadiness on feet (R26.81);Muscle weakness (generalized) (M62.81);Pain   Activity Tolerance Patient tolerated treatment well   Patient Left     Nurse Communication          Time: [id number] OT Time Calculation (min): 16 min  Charges: OT General Charges $OT Visit: 1 Visit OT Treatments $Self Care/Home Management : 8-22 mins  Sonia Baller, COTA/L Acute Rehabilitation [phone]   Janice Coffin 09/02/2019, 1:31 PM

## 2019-09-02 NOTE — Progress Notes (Addendum)
1445: Pt HR 150's sustained. Normal HR range for Pt ST 100s to 1teens. Daughter had taken Pt for walk in hallway then assisted her to the bathroom. Pt and daughter in bathroom when I checked on her for her HR. Pt very anxious stating she is trying to go, but is too nervous, waving her hand motioning for the door to be closed. Pt denied any adverse symptoms from HR. RN also educated not to strain with BM and that medicine can be given to help. Pt denied needs at this time, will monitor  1515: Pt back in chair, HR 125, C/o some surgical pain, difficulty with having BM, and anxiety regarding difficult BM. Pt and daughter report straining and digital disimpaction at home is normal for her. Pt hesitant to take laxative, but agreed to start stool softener. MD contacted for order

## 2019-09-03 LAB — CBC WITH DIFFERENTIAL/PLATELET
Abs Immature Granulocytes: 0.03 10*3/uL (ref 0.00–0.07)
Basophils Absolute: 0 10*3/uL (ref 0.0–0.1)
Basophils Relative: 0 %
Eosinophils Absolute: 0.3 10*3/uL (ref 0.0–0.5)
Eosinophils Relative: 4 %
HCT: 24.4 % — ABNORMAL LOW (ref 36.0–46.0)
Hemoglobin: 8.1 g/dL — ABNORMAL LOW (ref 12.0–15.0)
Immature Granulocytes: 0 %
Lymphocytes Relative: 13 %
Lymphs Abs: 0.9 10*3/uL (ref 0.7–4.0)
MCH: 31.8 pg (ref 26.0–34.0)
MCHC: 33.2 g/dL (ref 30.0–36.0)
MCV: 95.7 fL (ref 80.0–100.0)
Monocytes Absolute: 0.8 10*3/uL (ref 0.1–1.0)
Monocytes Relative: 11 %
Neutro Abs: 5.2 10*3/uL (ref 1.7–7.7)
Neutrophils Relative %: 72 %
Platelets: 160 10*3/uL (ref 150–400)
RBC: 2.55 MIL/uL — ABNORMAL LOW (ref 3.87–5.11)
RDW: 13.3 % (ref 11.5–15.5)
WBC: 7.2 10*3/uL (ref 4.0–10.5)
nRBC: 0 % (ref 0.0–0.2)

## 2019-09-03 MED ORDER — OXYCODONE HCL 5 MG PO TABS: 5.0000 mg | ORAL_TABLET | Freq: Four times a day (QID) | ORAL | 0 refills | Status: DC | PRN

## 2019-09-03 NOTE — Progress Notes (Signed)
Pt provided discharge instructions and education. Pt IV removed and intact. Pt telebox removed/ccmd notified. Pt vitals stable. Pt denies complaints. Pt has all belongings including dentures. Pt to be tx via volunteers to meet ride.  Jerald Kief, RN

## 2019-09-03 NOTE — TOC Transition Note (Signed)
Transition of Care [facility]) - CM/SW Discharge Note Marvetta Gibbons RN, BSN Transitions of Care Unit 4E- RN Case Manager [phone]   Patient Details  Name: Abigail Cohen MRN: [id number] Date of Birth: 11/13/1940  Transition of Care [facility]) CM/SW Contact:  Dawayne Patricia, RN Phone Number: 09/03/2019, 11:43 AM   Clinical Narrative:    Pt stable for transition home today, DME has already been delivered to room and daughter has placed in car (youth RW and 3n1 per [employer])- [facility] has referral for HHPT/OT- notified Tiffany for start of care- daughter to transport home- no further TOC needs.   Final next level of care: [place] Barriers to Discharge: No Barriers Identified   Patient Goals and CMS Choice Patient states their goals for this hospitalization and ongoing recovery are:: "I'm very excited to have my aneurism fixed and I'll be ready to go home when the physician lets me" CMS Medicare.gov Compare Post Acute Care list provided to:: Patient Choice offered to / list presented to : Patient  Discharge Placement                 Home with [facility]      Discharge Plan and Services   Discharge Planning Services: CM Consult Post Acute Care Choice: Home Health          DME Arranged: (daughter states RW with 5 in wheels and 3:1 already delivered and in the car.) DME Agency: [employer] Date DME Agency Contacted: 08/30/19 Time DME Agency Contacted: 1000 Representative spoke with at DME Agency: [facility] Arranged: PT, OT(previously arranged with [facility] prior to surgery by Dr. Scot Dock, the physician's practice) [place] Agency: [facility] [place] Date [place]: 09/03/19 Time [place]: 1143 Representative spoke with at [place]: [place] ([place]) Interventions     Readmission Risk Interventions No flowsheet data found.

## 2019-09-03 NOTE — Progress Notes (Signed)
Physical Therapy Treatment Patient Details Name: Abigail Cohen MRN: [id number] DOB: 13-Apr-1941 Today's Date: 09/03/2019    History of Present Illness 79 y.o. female who I saw on 06/27/2019 with a 5.6 cm infrarenal abdominal aortic aneurysm. Pt with PMH including HTN, OA, uterine prolapse. Pt underwent Open abdominal aortic aneurysm repair with aortobiiliac graft on 2/23.    PT Comments    Pt progressing well with mobility. She required supervision transfers and ambulation 250' with RW. Pt in recliner at end of session.   Follow Up Recommendations  Home health PT;Supervision/Assistance - 24 hour     Equipment Recommendations  Rolling walker with 5" wheels;Other (comment)(youth size RW, already delivered)    Recommendations for Other Services       Precautions / Restrictions Precautions Precautions: Fall;Other (comment) Precaution Comments: watch HR Restrictions Weight Bearing Restrictions: No    Mobility  Bed Mobility               General bed mobility comments: pt OOB in chair upon arrival   Transfers Overall transfer level: Needs assistance Equipment used: Rolling walker (2 wheeled) Transfers: Sit to/from [employer] Transfers Sit to Stand: Supervision Stand pivot transfers: Supervision       General transfer comment: supervision for safety. No physical assist.  Ambulation/Gait Ambulation/Gait assistance: Supervision Gait Distance (Feet): 250 Feet Assistive device: Rolling walker (2 wheeled) Gait Pattern/deviations: Step-through pattern Gait velocity: decreased Gait velocity interpretation: <1.31 ft/sec, indicative of household ambulator General Gait Details: Steady gait with RW   Stairs             Wheelchair Mobility    Modified Rankin (Stroke Patients Only)       Balance Overall balance assessment: Needs assistance Sitting-balance support: No upper extremity supported;Feet supported Sitting balance-Leahy Scale: Good      Standing balance support: Bilateral upper extremity supported;During functional activity Standing balance-Leahy Scale: Fair Standing balance comment: static stand without UE support. RW for amb                            Cognition Arousal/Alertness: Awake/alert Behavior During Therapy: WFL for tasks assessed/performed Overall Cognitive Status: Within Functional Limits for tasks assessed                                        Exercises      General Comments General comments (skin integrity, edema, etc.): resting HR 121. Max HR during amb 140.      Pertinent Vitals/Pain Pain Assessment: No/denies pain    Home Living                      Prior Function            PT Goals (current goals can now be found in the care plan section) Acute Rehab PT Goals Patient Stated Goal: home Progress towards PT goals: Progressing toward goals    Frequency    Min 3X/week      PT Plan Current plan remains appropriate    Co-evaluation              AM-PAC PT "6 Clicks" Mobility   Outcome Measure  Help needed turning from your back to your side while in a flat bed without using bedrails?: A Little Help needed moving from lying on your back to sitting on the side  of a flat bed without using bedrails?: A Little Help needed moving to and from a bed to a chair (including a wheelchair)?: A Little Help needed standing up from a chair using your arms (e.g., wheelchair or bedside chair)?: A Little Help needed to walk in hospital room?: A Little Help needed climbing 3-5 steps with a railing? : A Little 6 Click Score: 18    End of Session   Activity Tolerance: Patient tolerated treatment well Patient left: in chair;with call bell/phone within reach Nurse Communication: Mobility status PT Visit Diagnosis: Other abnormalities of gait and mobility (R26.89)     Time: [id number] PT Time Calculation (min) (ACUTE ONLY): 15 min  Charges:  $Gait  Training: 8-22 mins                     Lorrin Goodell, PT  Office # [phone] Pager [phone]    Lorriane Shire 09/03/2019, 10:06 AM

## 2019-09-03 NOTE — Progress Notes (Signed)
Pt ambulated x 470 feet around unit with front wheel walker, pt tolerated well but HR did increase to 130's back to 110's when resting

## 2019-09-03 NOTE — Progress Notes (Addendum)
[facility] [place]  Subjective  - Doing well and ready to go home.   Objective 124/71 100 98.1 F (36.7 C) (Oral) 20 96%  Intake/Output Summary (Last 24 hours) at 09/03/2019 [id number] Last data filed at 09/03/2019 0500 Gross per 24 hour  Intake 640 ml  Output --  Net 640 ml    Palpable DP B LE Abdomin soft NTTP, incision healing well Lungs non labored breathing   Assessment/Planning: POD # 6  s/p open abdominal aortic aneurysm repair   Tolerating regular diet, ambulating daily. Palpable DP pulses distally patent aorta Platlets improved now  160 Plan to be discharged on Asprin and statin. F/U with Dr. Scot Dock in 2-3 weeks  Roxy Horseman 09/03/2019 7:33 AM  I have interviewed the patient and examined the patient. I agree with the findings by the PA.  Gae Gallop, MD [phone]   Laboratory Lab Results: Recent Labs    09/01/19 0331 09/03/19 0225  WBC 6.4 7.2  HGB 7.7* 8.1*  HCT 22.9* 24.4*  PLT 85* 160   BMET No results for input(s): NA, K, CL, CO2, GLUCOSE, BUN, CREATININE, CALCIUM in the last 72 hours.  COAG Lab Results  Component Value Date   INR 1.4 (H) 08/28/2019   INR 1.1 08/27/2019   No results found for: PTT

## 2019-09-03 NOTE — Care Management Important Message (Signed)
Important Message  Patient Details  Name: CLYDA LUDY MRN: [id number] Date of Birth: 16-May-1941   Medicare Important Message Given:  Yes     Shelda Altes 09/03/2019, 10:37 AM

## 2019-09-04 DIAGNOSIS — Z4801 Encounter for change or removal of surgical wound dressing: Secondary | ICD-10-CM | POA: Diagnosis not present

## 2019-09-04 DIAGNOSIS — M6281 Muscle weakness (generalized): Secondary | ICD-10-CM | POA: Diagnosis not present

## 2019-09-04 DIAGNOSIS — R2681 Unsteadiness on feet: Secondary | ICD-10-CM | POA: Diagnosis not present

## 2019-09-04 DIAGNOSIS — I10 Essential (primary) hypertension: Secondary | ICD-10-CM | POA: Diagnosis not present

## 2019-09-04 DIAGNOSIS — I714 Abdominal aortic aneurysm, without rupture: Secondary | ICD-10-CM | POA: Diagnosis not present

## 2019-09-04 DIAGNOSIS — Z48812 Encounter for surgical aftercare following surgery on the circulatory system: Secondary | ICD-10-CM | POA: Diagnosis not present

## 2019-09-04 LAB — TYPE AND SCREEN
ABO/RH(D): O POS
Antibody Screen: NEGATIVE
Unit division: 0
Unit division: 0
Unit division: 0
Unit division: 0
Unit division: 0
Unit division: 0

## 2019-09-04 LAB — BPAM RBC
Blood Product Expiration Date: 202103232359
Blood Product Expiration Date: 202103232359
Blood Product Expiration Date: 202103232359
Blood Product Expiration Date: 202103232359
Blood Product Expiration Date: 202103262359
Blood Product Expiration Date: 202103262359
ISSUE DATE / TIME: 202102230631
ISSUE DATE / TIME: 202102230631
ISSUE DATE / TIME: 202102240816
ISSUE DATE / TIME: 202102240844
ISSUE DATE / TIME: 202102241027
ISSUE DATE / TIME: 202102241240
Unit Type and Rh: 5100
Unit Type and Rh: 5100
Unit Type and Rh: 5100
Unit Type and Rh: 5100
Unit Type and Rh: 5100
Unit Type and Rh: 5100

## 2019-09-04 NOTE — Discharge Summary (Signed)
[facility] Discharge Summary   Patient ID:  Abigail Cohen MRN: [id number] DOB/AGE: 11/29/1940 79 y.o.  Admit date: 08/28/2019 Discharge date: 09/03/19 Date of Surgery: 08/28/2019 Surgeon: Surgeon(s): Angelia Mould, MD Elam Dutch, MD  Admission Diagnosis: Abdominal aortic aneurysm (AAA) [facility]) [I71.4]  Discharge Diagnoses:  Abdominal aortic aneurysm (AAA) [facility]) [I71.4]  Secondary Diagnoses: Past Medical History:  Diagnosis Date  . AAA (abdominal aortic aneurysm) ([place])   . Borderline hypertension   . Family history of anesthesia complication    daughter gets nauseated  . Hearing loss    wears hearing aids  . Hypertension   . Knee joint replacement status   . Osteoarthritis   . Osteopenia   . Presence of intracervical pessary   . Uterine prolapse   . Wears glasses   . Wears partial dentures    upper and lower partial    Procedure(s): ANEURYSM ABDOMINAL AORTIC REPAIR OPEN  Discharged Condition: stable  HPI: 79 y/o female who was seen by Dr. Scot Dock 06/27/19 was found to have a 5.6 cm infrarenal abdominal aortic aneurysm.  She denise history of claudication, rest pain, lumbar or abdominal pain.  After CTA revealed 5.7 cm AAA she was sent for cardiac clearance and planned open aortic aneurysm repair.     Hospital Course:  Abigail Cohen is a 79 y.o. female is S/P  Procedure(s): ANEURYSM ABDOMINAL AORTIC REPAIR OPEN She remained hemodynamically stable and was transferred to 4E on 08/29/19.   She developed thrombocytopenia which was followed with serial labs.  Her SQ heparin was stopped.  Platelets improved.  She was advance to a heart healthy diet, ambulated with stand by assistance and restarted her Asprin and Statin prior to discharge home.  Stable disposition post op day 6 discharged home.   Significant Diagnostic Studies: CBC Lab Results  Component Value Date   WBC 7.2 09/03/2019   HGB 8.1 (L) 09/03/2019   HCT 24.4 (L) 09/03/2019    MCV 95.7 09/03/2019   PLT 160 09/03/2019    BMET    Component Value Date/Time   NA 134 (L) 08/31/2019 0232   K 3.1 (L) 08/31/2019 0232   CL 104 08/31/2019 0232   CO2 23 08/31/2019 0232   GLUCOSE 108 (H) 08/31/2019 0232   BUN 6 (L) 08/31/2019 0232   CREATININE 0.57 08/31/2019 0232   CREATININE 0.69 08/02/2018 1129   CALCIUM 7.1 (L) 08/31/2019 0232   GFRNONAA >60 08/31/2019 0232   GFRNONAA 82 08/02/2018 1129   GFRAA >60 08/31/2019 0232   GFRAA 95 08/02/2018 1129   COAG Lab Results  Component Value Date   INR 1.4 (H) 08/28/2019   INR 1.1 08/27/2019     Disposition:  Discharge to :Home Discharge Instructions    Call MD for:  redness, tenderness, or signs of infection (pain, swelling, bleeding, redness, odor or green/yellow discharge around incision site)   Complete by: As directed    Call MD for:  severe or increased pain, loss or decreased feeling  in affected limb(s)   Complete by: As directed    Call MD for:  temperature >100.5   Complete by: As directed    Resume previous diet   Complete by: As directed      Allergies as of 09/03/2019   No Known Allergies     Medication List    TAKE these medications   acetaminophen 500 MG tablet Commonly known as: TYLENOL Take 500 mg by mouth every 6 (six) hours as  needed (ARTHRITIS PAIN.).   ALPHA-LIPOIC ACID PO Take by mouth.   aspirin EC 81 MG tablet Take 1 tablet (81 mg total) by mouth daily. Notes to patient: Take tomorrow   BENFOTIAMINE PO Take 300 mg by mouth daily.   CALCIUM CITRATE PO Take 1-2 tablets by mouth at bedtime.   denosumab 60 MG/ML Sosy injection Commonly known as: PROLIA Inject 60 mg into the skin every 6 (six) months.   estradiol 0.1 MG/GM vaginal cream Commonly known as: ESTRACE Place 1 Applicatorful vaginally 2 (two) times a week.   Magnesium 250 MG Tabs Take 250 mg by mouth daily.   metoprolol succinate 25 MG 24 hr tablet Commonly known as: TOPROL-XL Take 25 mg by mouth daily.    multivitamin with minerals Tabs tablet Take 1 tablet by mouth daily.   oxyCODONE 5 MG immediate release tablet Commonly known as: Oxy IR/ROXICODONE Take 1 tablet (5 mg total) by mouth every 6 (six) hours as needed for moderate pain. Notes to patient: Take after 2   rosuvastatin 10 MG tablet Commonly known as: CRESTOR Take 1 tablet (10 mg total) by mouth daily. Notes to patient: Take tomorrow   Vitamin D3 10 MCG (400 UNIT) tablet Take 400 Units by mouth daily.      Verbal and written Discharge instructions given to the patient. Wound care per Discharge AVS Follow-up Information    [facility] Follow up.   Specialty: Home Health Services Why: Agency will be providing PT and OT in the home.  You should receive a call from the agency within 24-48 hours of discharge home. Contact information: [place] [phone]        [facility] Follow up.   Why: You received your rolling walker and 3:1 commode from [place]. Contact information: [address] [place] [address] [phone]        Angelia Mould, MD In 3 weeks.   Specialties: Vascular Surgery, Cardiology Contact information: [address] [place] [address] [phone]           Signed: Roxy Horseman 09/04/2019, 12:59 PM - For VQI Registry use --- Instructions: Press F2 to tab through selections.  Delete question if not applicable.   Post-op:  Time to Extubation: [x ] In OR, [ ]  < 12 hrs, [ ]  12-24 hrs, [ ]  >=24 hrs Vasopressors Req. Post-op: No ICU Stay: 2 days Transfusion: No  If yes, 0 units given MI: [ ]  No, [ ]  Troponin only, [ ]  EKG or Clinical New Arrhythmia: No  Complications: CHF: No Resp failure: [x ] none, [ ]  Pneumonia, [ ]  Ventilator Chg in renal function: [x ] none, [ ]  Inc. Cr > 0.5, [ ]  Temp. Dialysis, [ ]  Permanent dialysis Leg ischemia: x[ ]  No, [ ]  Yes, no Surgery needed, [ ]  Yes, Surgery needed,  [ ]  Amputation Bowel ischemia: [x ] No, [ ]  Medical Rx, [ ]  Surgical Rx Wound complication: [x ] No, [ ]  Superficial separation/infection, [ ]  Return to OR Return to OR: No  Return to OR for bleeding: No Stroke: [x ] None, [ ]  Minor, [ ]  Major  Discharge medications: Statin use:  Yes ASA use:  Yes Plavix use:  No  for medical reason   Beta blocker use: Yes

## 2019-09-06 DIAGNOSIS — Z4801 Encounter for change or removal of surgical wound dressing: Secondary | ICD-10-CM | POA: Diagnosis not present

## 2019-09-06 DIAGNOSIS — I714 Abdominal aortic aneurysm, without rupture: Secondary | ICD-10-CM | POA: Diagnosis not present

## 2019-09-06 DIAGNOSIS — R2681 Unsteadiness on feet: Secondary | ICD-10-CM | POA: Diagnosis not present

## 2019-09-06 DIAGNOSIS — Z48812 Encounter for surgical aftercare following surgery on the circulatory system: Secondary | ICD-10-CM | POA: Diagnosis not present

## 2019-09-06 DIAGNOSIS — I10 Essential (primary) hypertension: Secondary | ICD-10-CM | POA: Diagnosis not present

## 2019-09-06 DIAGNOSIS — M6281 Muscle weakness (generalized): Secondary | ICD-10-CM | POA: Diagnosis not present

## 2019-09-07 DIAGNOSIS — Z48812 Encounter for surgical aftercare following surgery on the circulatory system: Secondary | ICD-10-CM | POA: Diagnosis not present

## 2019-09-07 DIAGNOSIS — I714 Abdominal aortic aneurysm, without rupture: Secondary | ICD-10-CM | POA: Diagnosis not present

## 2019-09-07 DIAGNOSIS — Z4801 Encounter for change or removal of surgical wound dressing: Secondary | ICD-10-CM | POA: Diagnosis not present

## 2019-09-07 DIAGNOSIS — M6281 Muscle weakness (generalized): Secondary | ICD-10-CM | POA: Diagnosis not present

## 2019-09-07 DIAGNOSIS — I10 Essential (primary) hypertension: Secondary | ICD-10-CM | POA: Diagnosis not present

## 2019-09-07 DIAGNOSIS — R2681 Unsteadiness on feet: Secondary | ICD-10-CM | POA: Diagnosis not present

## 2019-09-10 ENCOUNTER — Telehealth: Payer: Self-pay

## 2019-09-10 NOTE — Telephone Encounter (Signed)
Patient calling to make [place] aware that she will need assistance when she comes in for her Prolia injection due to having major abdominal surgery-patient has not been scheduled for this SOB are back and we will call as soon posible to  schedule a nurse visit-FYI

## 2019-09-11 ENCOUNTER — Ambulatory Visit: Payer: Medicare Other | Admitting: Podiatry

## 2019-09-11 DIAGNOSIS — M6281 Muscle weakness (generalized): Secondary | ICD-10-CM | POA: Diagnosis not present

## 2019-09-11 DIAGNOSIS — Z48812 Encounter for surgical aftercare following surgery on the circulatory system: Secondary | ICD-10-CM | POA: Diagnosis not present

## 2019-09-11 DIAGNOSIS — I10 Essential (primary) hypertension: Secondary | ICD-10-CM | POA: Diagnosis not present

## 2019-09-11 DIAGNOSIS — Z4801 Encounter for change or removal of surgical wound dressing: Secondary | ICD-10-CM | POA: Diagnosis not present

## 2019-09-11 DIAGNOSIS — I714 Abdominal aortic aneurysm, without rupture: Secondary | ICD-10-CM | POA: Diagnosis not present

## 2019-09-11 DIAGNOSIS — R2681 Unsteadiness on feet: Secondary | ICD-10-CM | POA: Diagnosis not present

## 2019-09-11 NOTE — Telephone Encounter (Signed)
SOB has returned NO PA needed Pt owes $203 at check in  [facility] to schedule

## 2019-09-12 ENCOUNTER — Telehealth: Payer: Self-pay | Admitting: Internal Medicine

## 2019-09-12 DIAGNOSIS — M6281 Muscle weakness (generalized): Secondary | ICD-10-CM | POA: Diagnosis not present

## 2019-09-12 DIAGNOSIS — I714 Abdominal aortic aneurysm, without rupture: Secondary | ICD-10-CM | POA: Diagnosis not present

## 2019-09-12 DIAGNOSIS — Z4801 Encounter for change or removal of surgical wound dressing: Secondary | ICD-10-CM | POA: Diagnosis not present

## 2019-09-12 DIAGNOSIS — I10 Essential (primary) hypertension: Secondary | ICD-10-CM | POA: Diagnosis not present

## 2019-09-12 DIAGNOSIS — R2681 Unsteadiness on feet: Secondary | ICD-10-CM | POA: Diagnosis not present

## 2019-09-12 DIAGNOSIS — Z48812 Encounter for surgical aftercare following surgery on the circulatory system: Secondary | ICD-10-CM | POA: Diagnosis not present

## 2019-09-12 NOTE — Telephone Encounter (Signed)
Patient called back stating that she got a voicemail to schedule her Prolia injection - I scheduled her and informed patient that her copay due would be $203, patient requests to have you give her a call to explain this. She wanted to make sure her insurance was double checked and I did inform her that it is checked before we schedule but she did want you to give her a call. Ph# [phone].

## 2019-09-13 DIAGNOSIS — M6281 Muscle weakness (generalized): Secondary | ICD-10-CM | POA: Diagnosis not present

## 2019-09-13 DIAGNOSIS — R2681 Unsteadiness on feet: Secondary | ICD-10-CM | POA: Diagnosis not present

## 2019-09-13 DIAGNOSIS — Z48812 Encounter for surgical aftercare following surgery on the circulatory system: Secondary | ICD-10-CM | POA: Diagnosis not present

## 2019-09-13 DIAGNOSIS — Z4801 Encounter for change or removal of surgical wound dressing: Secondary | ICD-10-CM | POA: Diagnosis not present

## 2019-09-13 DIAGNOSIS — I10 Essential (primary) hypertension: Secondary | ICD-10-CM | POA: Diagnosis not present

## 2019-09-13 DIAGNOSIS — I714 Abdominal aortic aneurysm, without rupture: Secondary | ICD-10-CM | POA: Diagnosis not present

## 2019-09-14 DIAGNOSIS — I714 Abdominal aortic aneurysm, without rupture: Secondary | ICD-10-CM | POA: Diagnosis not present

## 2019-09-14 DIAGNOSIS — R2681 Unsteadiness on feet: Secondary | ICD-10-CM | POA: Diagnosis not present

## 2019-09-14 DIAGNOSIS — M6281 Muscle weakness (generalized): Secondary | ICD-10-CM | POA: Diagnosis not present

## 2019-09-14 DIAGNOSIS — Z4801 Encounter for change or removal of surgical wound dressing: Secondary | ICD-10-CM | POA: Diagnosis not present

## 2019-09-14 DIAGNOSIS — Z48812 Encounter for surgical aftercare following surgery on the circulatory system: Secondary | ICD-10-CM | POA: Diagnosis not present

## 2019-09-14 DIAGNOSIS — I10 Essential (primary) hypertension: Secondary | ICD-10-CM | POA: Diagnosis not present

## 2019-09-17 DIAGNOSIS — R2681 Unsteadiness on feet: Secondary | ICD-10-CM | POA: Diagnosis not present

## 2019-09-17 DIAGNOSIS — M6281 Muscle weakness (generalized): Secondary | ICD-10-CM | POA: Diagnosis not present

## 2019-09-17 DIAGNOSIS — I10 Essential (primary) hypertension: Secondary | ICD-10-CM | POA: Diagnosis not present

## 2019-09-17 DIAGNOSIS — I714 Abdominal aortic aneurysm, without rupture: Secondary | ICD-10-CM | POA: Diagnosis not present

## 2019-09-17 DIAGNOSIS — Z4801 Encounter for change or removal of surgical wound dressing: Secondary | ICD-10-CM | POA: Diagnosis not present

## 2019-09-17 DIAGNOSIS — Z48812 Encounter for surgical aftercare following surgery on the circulatory system: Secondary | ICD-10-CM | POA: Diagnosis not present

## 2019-09-18 ENCOUNTER — Telehealth (HOSPITAL_COMMUNITY): Payer: Self-pay

## 2019-09-18 DIAGNOSIS — I10 Essential (primary) hypertension: Secondary | ICD-10-CM | POA: Diagnosis not present

## 2019-09-18 DIAGNOSIS — I714 Abdominal aortic aneurysm, without rupture: Secondary | ICD-10-CM | POA: Diagnosis not present

## 2019-09-18 DIAGNOSIS — R2681 Unsteadiness on feet: Secondary | ICD-10-CM | POA: Diagnosis not present

## 2019-09-18 DIAGNOSIS — Z4801 Encounter for change or removal of surgical wound dressing: Secondary | ICD-10-CM | POA: Diagnosis not present

## 2019-09-18 DIAGNOSIS — M6281 Muscle weakness (generalized): Secondary | ICD-10-CM | POA: Diagnosis not present

## 2019-09-18 DIAGNOSIS — Z48812 Encounter for surgical aftercare following surgery on the circulatory system: Secondary | ICD-10-CM | POA: Diagnosis not present

## 2019-09-18 NOTE — Telephone Encounter (Signed)
The above patient or their representative was contacted and gave the following answers to these questions:         Do you have any of the following symptoms?    NO  Fever                    Cough                   Shortness of breath  Do  you have any of the following other symptoms?    muscle pain         vomiting,        diarrhea        rash         weakness        red eye        abdominal pain         bruising          bruising or bleeding              joint pain           severe headache    Have you been in contact with someone who was or has been sick in the past 2 weeks?  NO  Yes                 Unsure                         Unable to assess   Does the person that you were in contact with have any of the following symptoms?   Cough         shortness of breath           muscle pain         vomiting,            diarrhea            rash            weakness           fever            red eye           abdominal pain           bruising  or  bleeding                joint pain                severe headache                 COMMENTS OR ACTION PLAN FOR THIS PATIENT:        ALL QUESTIONS WERE ANSWERED/[facility] PATIENT HAS GOTTEN BOTH COVID SHOTS

## 2019-09-19 ENCOUNTER — Ambulatory Visit (INDEPENDENT_AMBULATORY_CARE_PROVIDER_SITE_OTHER): Payer: Self-pay | Admitting: Vascular Surgery

## 2019-09-19 ENCOUNTER — Other Ambulatory Visit: Payer: Self-pay

## 2019-09-19 ENCOUNTER — Encounter: Payer: Self-pay | Admitting: Vascular Surgery

## 2019-09-19 VITALS — BP 129/80 | HR 92 | Temp 97.8°F | Resp 20 | Ht <= 58 in | Wt 122.0 lb

## 2019-09-19 DIAGNOSIS — Z48812 Encounter for surgical aftercare following surgery on the circulatory system: Secondary | ICD-10-CM

## 2019-09-19 DIAGNOSIS — I714 Abdominal aortic aneurysm, without rupture, unspecified: Secondary | ICD-10-CM

## 2019-09-19 NOTE — Progress Notes (Signed)
Patient name: Abigail Cohen MRN: [id number] DOB: [birth year] Sex: female  REASON FOR VISIT:   Follow-up after open repair of abdominal aortic aneurysm  HPI:   Abigail Cohen is a pleasant 79 y.o. 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  She was not a candidate for an endovascular aneurysm repair.  She underwent open repair of these aneurysms on 08/28/2019.  She had an aortobiiliac graft.  I sewed the distal limbs of the graft into side into the external iliac arteries and the proximal common iliac arteries were oversewn.  She had palpable pedal pulses at the completion of the procedure.  She did well postoperatively and was discharged on 09/03/2019.  She comes in for her first outpatient visit.  Since I saw her in the hospital she has been doing well and gradually resuming her normal activities.  Her bowels have been functioning normally.  She has no claudication.  She has no chest pain or shortness of breath.  Current Outpatient Medications  Medication Sig Dispense Refill  . acetaminophen (TYLENOL) 500 MG tablet Take 500 mg by mouth every 6 (six) hours as needed (ARTHRITIS PAIN.).    Marland Kitchen ALPHA-LIPOIC ACID PO Take by mouth.    Marland Kitchen aspirin EC 81 MG tablet Take 1 tablet (81 mg total) by mouth daily.    . BENFOTIAMINE PO Take 300 mg by mouth daily.    Marland Kitchen CALCIUM CITRATE PO Take 1-2 tablets by mouth at bedtime.    . Cholecalciferol (VITAMIN D3) 10 MCG (400 UNIT) tablet Take 400 Units by mouth daily.    Marland Kitchen denosumab (PROLIA) 60 MG/ML SOSY injection Inject 60 mg into the skin every 6 (six) months.    Marland Kitchen estradiol (ESTRACE) 0.1 MG/GM vaginal cream Place 1 Applicatorful vaginally 2 (two) times a week.     . Magnesium 250 MG TABS Take 250 mg by mouth daily.     . metoprolol succinate (TOPROL-XL) 25 MG 24 hr tablet Take 25 mg by mouth daily.    . Multiple Vitamin (MULTIVITAMIN WITH MINERALS) TABS tablet Take 1 tablet by mouth daily.    Marland Kitchen  oxyCODONE (OXY IR/ROXICODONE) 5 MG immediate release tablet Take 1 tablet (5 mg total) by mouth every 6 (six) hours as needed for moderate pain. 20 tablet 0  . rosuvastatin (CRESTOR) 10 MG tablet Take 1 tablet (10 mg total) by mouth daily. 90 tablet 3   No current facility-administered medications for this visit.    REVIEW OF SYSTEMS:  [X]  denotes positive finding, [ ]  denotes negative finding Vascular    Leg swelling    Cardiac    Chest pain or chest pressure:    Shortness of breath upon exertion:    Short of breath when lying flat:    Irregular heart rhythm:    Constitutional    Fever or chills:     PHYSICAL EXAM:   Vitals:   09/19/19 1515  BP: 129/80  Pulse: 92  Resp: 20  Temp: 97.8 F (36.6 C)  SpO2: 95%  Weight: 122 lb (55.3 kg)  Height: 4\' 10"  (1.473 m)    GENERAL: The patient is a well-nourished female, in no acute distress. The vital signs are documented above. CARDIOVASCULAR: There is a regular rate and rhythm. PULMONARY: There is good air exchange bilaterally without wheezing or rales. Her abdominal incision is healing nicely. She has palpable femoral and pedal pulses bilaterally.  DATA:   No new  data  MEDICAL ISSUES:   STATUS POST OPEN ABDOMINAL AORTIC ANEURYSM REPAIR: The patient is doing well status post open abdominal aortic aneurysm repair.  I have encouraged her to resume her normal activities.  She will avoid heavy lifting for 6 months.  She can drive 6 weeks postop.  I will see her back in 2 months.  She knows to call sooner if she has problems.  [name] [facility] [phone]

## 2019-09-20 DIAGNOSIS — I714 Abdominal aortic aneurysm, without rupture: Secondary | ICD-10-CM | POA: Diagnosis not present

## 2019-09-20 DIAGNOSIS — Z48812 Encounter for surgical aftercare following surgery on the circulatory system: Secondary | ICD-10-CM | POA: Diagnosis not present

## 2019-09-20 DIAGNOSIS — Z4801 Encounter for change or removal of surgical wound dressing: Secondary | ICD-10-CM | POA: Diagnosis not present

## 2019-09-20 DIAGNOSIS — M6281 Muscle weakness (generalized): Secondary | ICD-10-CM | POA: Diagnosis not present

## 2019-09-20 DIAGNOSIS — R2681 Unsteadiness on feet: Secondary | ICD-10-CM | POA: Diagnosis not present

## 2019-09-20 DIAGNOSIS — I10 Essential (primary) hypertension: Secondary | ICD-10-CM | POA: Diagnosis not present

## 2019-09-20 NOTE — Telephone Encounter (Signed)
Routing...

## 2019-09-20 NOTE — Telephone Encounter (Signed)
LMTCB Pt has now met deductible and should not have a copay at all

## 2019-09-21 DIAGNOSIS — R2681 Unsteadiness on feet: Secondary | ICD-10-CM | POA: Diagnosis not present

## 2019-09-21 DIAGNOSIS — Z4801 Encounter for change or removal of surgical wound dressing: Secondary | ICD-10-CM | POA: Diagnosis not present

## 2019-09-21 DIAGNOSIS — Z48812 Encounter for surgical aftercare following surgery on the circulatory system: Secondary | ICD-10-CM | POA: Diagnosis not present

## 2019-09-21 DIAGNOSIS — M6281 Muscle weakness (generalized): Secondary | ICD-10-CM | POA: Diagnosis not present

## 2019-09-21 DIAGNOSIS — I10 Essential (primary) hypertension: Secondary | ICD-10-CM | POA: Diagnosis not present

## 2019-09-21 DIAGNOSIS — I714 Abdominal aortic aneurysm, without rupture: Secondary | ICD-10-CM | POA: Diagnosis not present

## 2019-09-24 DIAGNOSIS — M6281 Muscle weakness (generalized): Secondary | ICD-10-CM | POA: Diagnosis not present

## 2019-09-24 DIAGNOSIS — Z4801 Encounter for change or removal of surgical wound dressing: Secondary | ICD-10-CM | POA: Diagnosis not present

## 2019-09-24 DIAGNOSIS — I714 Abdominal aortic aneurysm, without rupture: Secondary | ICD-10-CM | POA: Diagnosis not present

## 2019-09-24 DIAGNOSIS — Z48812 Encounter for surgical aftercare following surgery on the circulatory system: Secondary | ICD-10-CM | POA: Diagnosis not present

## 2019-09-24 DIAGNOSIS — I10 Essential (primary) hypertension: Secondary | ICD-10-CM | POA: Diagnosis not present

## 2019-09-24 DIAGNOSIS — R2681 Unsteadiness on feet: Secondary | ICD-10-CM | POA: Diagnosis not present

## 2019-09-25 DIAGNOSIS — R2681 Unsteadiness on feet: Secondary | ICD-10-CM | POA: Diagnosis not present

## 2019-09-25 DIAGNOSIS — Z4801 Encounter for change or removal of surgical wound dressing: Secondary | ICD-10-CM | POA: Diagnosis not present

## 2019-09-25 DIAGNOSIS — I10 Essential (primary) hypertension: Secondary | ICD-10-CM | POA: Diagnosis not present

## 2019-09-25 DIAGNOSIS — M6281 Muscle weakness (generalized): Secondary | ICD-10-CM | POA: Diagnosis not present

## 2019-09-25 DIAGNOSIS — I714 Abdominal aortic aneurysm, without rupture: Secondary | ICD-10-CM | POA: Diagnosis not present

## 2019-09-25 DIAGNOSIS — Z48812 Encounter for surgical aftercare following surgery on the circulatory system: Secondary | ICD-10-CM | POA: Diagnosis not present

## 2019-09-27 DIAGNOSIS — R2681 Unsteadiness on feet: Secondary | ICD-10-CM | POA: Diagnosis not present

## 2019-09-27 DIAGNOSIS — I10 Essential (primary) hypertension: Secondary | ICD-10-CM | POA: Diagnosis not present

## 2019-09-27 DIAGNOSIS — Z48812 Encounter for surgical aftercare following surgery on the circulatory system: Secondary | ICD-10-CM | POA: Diagnosis not present

## 2019-09-27 DIAGNOSIS — Z4801 Encounter for change or removal of surgical wound dressing: Secondary | ICD-10-CM | POA: Diagnosis not present

## 2019-09-27 DIAGNOSIS — I714 Abdominal aortic aneurysm, without rupture: Secondary | ICD-10-CM | POA: Diagnosis not present

## 2019-09-27 DIAGNOSIS — M6281 Muscle weakness (generalized): Secondary | ICD-10-CM | POA: Diagnosis not present

## 2019-10-01 DIAGNOSIS — I10 Essential (primary) hypertension: Secondary | ICD-10-CM | POA: Diagnosis not present

## 2019-10-01 DIAGNOSIS — I714 Abdominal aortic aneurysm, without rupture: Secondary | ICD-10-CM | POA: Diagnosis not present

## 2019-10-01 DIAGNOSIS — Z48812 Encounter for surgical aftercare following surgery on the circulatory system: Secondary | ICD-10-CM | POA: Diagnosis not present

## 2019-10-01 DIAGNOSIS — Z4801 Encounter for change or removal of surgical wound dressing: Secondary | ICD-10-CM | POA: Diagnosis not present

## 2019-10-01 DIAGNOSIS — M6281 Muscle weakness (generalized): Secondary | ICD-10-CM | POA: Diagnosis not present

## 2019-10-01 DIAGNOSIS — R2681 Unsteadiness on feet: Secondary | ICD-10-CM | POA: Diagnosis not present

## 2019-10-02 ENCOUNTER — Other Ambulatory Visit: Payer: Self-pay

## 2019-10-02 ENCOUNTER — Ambulatory Visit (INDEPENDENT_AMBULATORY_CARE_PROVIDER_SITE_OTHER): Payer: Medicare Other

## 2019-10-02 DIAGNOSIS — M810 Age-related osteoporosis without current pathological fracture: Secondary | ICD-10-CM

## 2019-10-02 MED ORDER — DENOSUMAB 60 MG/ML ~~LOC~~ SOSY
60.0000 mg | PREFILLED_SYRINGE | Freq: Once | SUBCUTANEOUS | Status: AC
  Administered 2019-10-02: 60 mg via SUBCUTANEOUS

## 2019-10-02 NOTE — Progress Notes (Addendum)
Per Dr Cruzita Lederer patient to come in for prolia 60 mg injection.  Patient given prolia 60 mg injection in left upper arm.  Patient tolerated injection well.    I supervised the injection. Philemon Kingdom, MD PhD [facility]

## 2019-10-04 DIAGNOSIS — Z4801 Encounter for change or removal of surgical wound dressing: Secondary | ICD-10-CM | POA: Diagnosis not present

## 2019-10-16 ENCOUNTER — Ambulatory Visit (INDEPENDENT_AMBULATORY_CARE_PROVIDER_SITE_OTHER): Payer: Medicare Other | Admitting: Podiatry

## 2019-10-16 ENCOUNTER — Other Ambulatory Visit: Payer: Self-pay

## 2019-10-16 ENCOUNTER — Encounter: Payer: Self-pay | Admitting: Podiatry

## 2019-10-16 DIAGNOSIS — B351 Tinea unguium: Secondary | ICD-10-CM | POA: Diagnosis not present

## 2019-10-16 DIAGNOSIS — M79676 Pain in unspecified toe(s): Secondary | ICD-10-CM | POA: Diagnosis not present

## 2019-10-16 NOTE — Progress Notes (Signed)
She presents today after her AAA repair in December with a chief complaint of painfully elongated toenails.  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  Toenails are long thick yellow dystrophic-like mycotic painful palpation.  Assessment: Pain in limb secondary to onychomycosis.  Plan: Debridement of toenails 1 through 5 bilateral follow-up with her in 6 months

## 2019-10-17 DIAGNOSIS — Z8679 Personal history of other diseases of the circulatory system: Secondary | ICD-10-CM | POA: Diagnosis not present

## 2019-10-17 DIAGNOSIS — Z9889 Other specified postprocedural states: Secondary | ICD-10-CM | POA: Diagnosis not present

## 2019-10-17 DIAGNOSIS — K5900 Constipation, unspecified: Secondary | ICD-10-CM | POA: Diagnosis not present

## 2019-10-19 DIAGNOSIS — Z4689 Encounter for fitting and adjustment of other specified devices: Secondary | ICD-10-CM | POA: Diagnosis not present

## 2019-10-29 DIAGNOSIS — M150 Primary generalized (osteo)arthritis: Secondary | ICD-10-CM | POA: Diagnosis not present

## 2019-10-29 DIAGNOSIS — M2550 Pain in unspecified joint: Secondary | ICD-10-CM | POA: Diagnosis not present

## 2019-10-29 DIAGNOSIS — M545 Low back pain: Secondary | ICD-10-CM | POA: Diagnosis not present

## 2019-10-29 DIAGNOSIS — E663 Overweight: Secondary | ICD-10-CM | POA: Diagnosis not present

## 2019-10-29 DIAGNOSIS — Z6825 Body mass index (BMI) 25.0-25.9, adult: Secondary | ICD-10-CM | POA: Diagnosis not present

## 2019-10-29 DIAGNOSIS — M810 Age-related osteoporosis without current pathological fracture: Secondary | ICD-10-CM | POA: Diagnosis not present

## 2019-10-29 DIAGNOSIS — R768 Other specified abnormal immunological findings in serum: Secondary | ICD-10-CM | POA: Diagnosis not present

## 2019-11-21 ENCOUNTER — Other Ambulatory Visit: Payer: Self-pay

## 2019-11-21 ENCOUNTER — Ambulatory Visit (INDEPENDENT_AMBULATORY_CARE_PROVIDER_SITE_OTHER): Payer: Self-pay | Admitting: Vascular Surgery

## 2019-11-21 ENCOUNTER — Encounter: Payer: Self-pay | Admitting: Vascular Surgery

## 2019-11-21 VITALS — BP 120/79 | HR 78 | Temp 98.0°F | Resp 20 | Ht <= 58 in | Wt 124.0 lb

## 2019-11-21 DIAGNOSIS — I714 Abdominal aortic aneurysm, without rupture, unspecified: Secondary | ICD-10-CM

## 2019-11-21 DIAGNOSIS — Z48812 Encounter for surgical aftercare following surgery on the circulatory system: Secondary | ICD-10-CM

## 2019-11-21 NOTE — Progress Notes (Signed)
Patient name: Abigail Cohen MRN: [id number] DOB: [birth year] Sex: female  REASON FOR VISIT:   Follow-up after open repair of abdominal aortic aneurysm.  HPI:   Abigail Cohen is a pleasant 79 y.o. 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  She was not a candidate for endovascular repair.  She underwent open repair of her aneurysm on 08/28/2019.  She had an aortobiiliac graft.  I last saw her on 09/19/2019.  She was doing well.  I encouraged her to avoid heavy lifting for 6 months.  She comes in for 71-month follow-up visit.  Since I saw her last she denies any abdominal pain.  She has been walking and resuming her normal activities.  Her bowels are functioning normally.  She has no nausea or vomiting.  She has no claudication.  Current Outpatient Medications  Medication Sig Dispense Refill  . acetaminophen (TYLENOL) 500 MG tablet Take 500 mg by mouth every 6 (six) hours as needed (ARTHRITIS PAIN.).    Marland Kitchen aspirin EC 81 MG tablet Take 1 tablet (81 mg total) by mouth daily.    . BENFOTIAMINE PO Take 300 mg by mouth daily.    Marland Kitchen CALCIUM CITRATE PO Take 1-2 tablets by mouth at bedtime.    . Cholecalciferol (VITAMIN D3) 10 MCG (400 UNIT) tablet Take 400 Units by mouth daily.    Marland Kitchen denosumab (PROLIA) 60 MG/ML SOSY injection Inject 60 mg into the skin every 6 (six) months.    Marland Kitchen estradiol (ESTRACE) 0.1 MG/GM vaginal cream Place 1 Applicatorful vaginally 2 (two) times a week.     . Magnesium 250 MG TABS Take 250 mg by mouth daily.     . metoprolol succinate (TOPROL-XL) 25 MG 24 hr tablet Take 25 mg by mouth daily.    . Multiple Vitamin (MULTIVITAMIN WITH MINERALS) TABS tablet Take 1 tablet by mouth daily.    . rosuvastatin (CRESTOR) 10 MG tablet Take 1 tablet (10 mg total) by mouth daily. 90 tablet 3   No current facility-administered medications for this visit.    REVIEW OF SYSTEMS:  [X]  denotes positive finding, [ ]  denotes  negative finding Vascular    Leg swelling    Cardiac    Chest pain or chest pressure:    Shortness of breath upon exertion:    Short of breath when lying flat:    Irregular heart rhythm:    Constitutional    Fever or chills:     PHYSICAL EXAM:   Vitals:   11/21/19 1311  BP: 120/79  Pulse: 78  Resp: 20  Temp: 98 F (36.7 C)  SpO2: 98%  Weight: 124 lb (56.2 kg)  Height: 4\' 10"  (1.473 m)    GENERAL: The patient is a well-nourished female, in no acute distress. The vital signs are documented above. CARDIOVASCULAR: There is a regular rate and rhythm. PULMONARY: There is good air exchange bilaterally without wheezing or rales. ABDOMEN: Her incision is healing nicely.  She has normal pitched bowel sounds. VASCULAR: She has palpable femoral dorsalis pedis and posterior tibial pulses bilaterally.  DATA:   No new data  MEDICAL ISSUES:   STATUS POST OPEN REPAIR OF ABDOMINAL AORTIC ANEURYSM: The patient is now 3 months after open repair of her abdominal aortic aneurysm.  She is doing well and can resume her normal activities.  She knows to avoid any heavy lifting for 6 months.  I plan on seeing her back  in 1 year with follow-up ABIs.  She knows to call sooner if she has problems.  [name] [facility] [place] [phone]

## 2019-11-22 ENCOUNTER — Other Ambulatory Visit: Payer: Self-pay | Admitting: *Deleted

## 2019-11-22 DIAGNOSIS — I714 Abdominal aortic aneurysm, without rupture, unspecified: Secondary | ICD-10-CM

## 2020-01-17 DIAGNOSIS — I700 Atherosclerosis of aorta: Secondary | ICD-10-CM | POA: Diagnosis not present

## 2020-01-17 DIAGNOSIS — Z8679 Personal history of other diseases of the circulatory system: Secondary | ICD-10-CM | POA: Diagnosis not present

## 2020-01-17 DIAGNOSIS — E785 Hyperlipidemia, unspecified: Secondary | ICD-10-CM | POA: Diagnosis not present

## 2020-01-17 DIAGNOSIS — G629 Polyneuropathy, unspecified: Secondary | ICD-10-CM | POA: Diagnosis not present

## 2020-01-17 DIAGNOSIS — I10 Essential (primary) hypertension: Secondary | ICD-10-CM | POA: Diagnosis not present

## 2020-01-17 DIAGNOSIS — Z9889 Other specified postprocedural states: Secondary | ICD-10-CM | POA: Diagnosis not present

## 2020-01-18 ENCOUNTER — Other Ambulatory Visit: Payer: Self-pay | Admitting: Obstetrics and Gynecology

## 2020-01-18 DIAGNOSIS — K5900 Constipation, unspecified: Secondary | ICD-10-CM | POA: Diagnosis not present

## 2020-01-18 DIAGNOSIS — M8580 Other specified disorders of bone density and structure, unspecified site: Secondary | ICD-10-CM

## 2020-01-18 DIAGNOSIS — Z4689 Encounter for fitting and adjustment of other specified devices: Secondary | ICD-10-CM | POA: Diagnosis not present

## 2020-01-18 DIAGNOSIS — Z01419 Encounter for gynecological examination (general) (routine) without abnormal findings: Secondary | ICD-10-CM | POA: Diagnosis not present

## 2020-01-21 ENCOUNTER — Other Ambulatory Visit: Payer: Self-pay | Admitting: Obstetrics and Gynecology

## 2020-01-21 DIAGNOSIS — Z1231 Encounter for screening mammogram for malignant neoplasm of breast: Secondary | ICD-10-CM

## 2020-01-21 DIAGNOSIS — M810 Age-related osteoporosis without current pathological fracture: Secondary | ICD-10-CM

## 2020-03-03 ENCOUNTER — Ambulatory Visit
Admission: RE | Admit: 2020-03-03 | Discharge: 2020-03-03 | Disposition: A | Discharge status: Home / Self Care | Payer: Medicare Other | Source: Ambulatory Visit | Attending: Obstetrics and Gynecology | Admitting: Obstetrics and Gynecology

## 2020-03-03 ENCOUNTER — Other Ambulatory Visit: Payer: Self-pay

## 2020-03-03 DIAGNOSIS — Z1231 Encounter for screening mammogram for malignant neoplasm of breast: Secondary | ICD-10-CM

## 2020-03-05 DIAGNOSIS — Z23 Encounter for immunization: Secondary | ICD-10-CM | POA: Diagnosis not present

## 2020-03-07 ENCOUNTER — Telehealth: Payer: Self-pay

## 2020-03-07 NOTE — Telephone Encounter (Signed)
Pt has been submitted to [employer] to verify coverage for Prolia.

## 2020-03-25 ENCOUNTER — Telehealth: Payer: Self-pay

## 2020-03-25 NOTE — Telephone Encounter (Signed)
New message    Patient calling to schedule her Prolia shot . Asking for a callback from the nurse.

## 2020-03-28 NOTE — Telephone Encounter (Signed)
Summary of benefits indicates that the patient does have coverage for Prolia, and it does not require a prior authorization. Patient has a deductible of $203, which has been met, and 20% coinsurance, however, pt's secondary insurance does pick up the remaining balance that primary does not pay.  Therefor, patient does not owe anything for Prolia injection. She can have the injection anytime on or after 04/04/20.  Patient has been scheduled for 04/10/20.

## 2020-04-10 ENCOUNTER — Other Ambulatory Visit: Payer: Self-pay

## 2020-04-10 ENCOUNTER — Ambulatory Visit (INDEPENDENT_AMBULATORY_CARE_PROVIDER_SITE_OTHER): Payer: Medicare Other

## 2020-04-10 DIAGNOSIS — M810 Age-related osteoporosis without current pathological fracture: Secondary | ICD-10-CM | POA: Diagnosis not present

## 2020-04-10 MED ORDER — DENOSUMAB 60 MG/ML ~~LOC~~ SOSY
60.0000 mg | PREFILLED_SYRINGE | Freq: Once | SUBCUTANEOUS | Status: AC
  Administered 2020-04-10: 60 mg via SUBCUTANEOUS

## 2020-04-12 NOTE — Addendum Note (Signed)
Addended by: Amado Coe on: 04/12/2020 08:40 PM   Modules accepted: Level of Service

## 2020-04-12 NOTE — Progress Notes (Signed)
Prolia injection given. Patient tolerated well 

## 2020-04-17 ENCOUNTER — Other Ambulatory Visit: Payer: Self-pay

## 2020-04-17 ENCOUNTER — Ambulatory Visit (INDEPENDENT_AMBULATORY_CARE_PROVIDER_SITE_OTHER): Payer: Medicare Other | Admitting: Podiatry

## 2020-04-17 ENCOUNTER — Encounter: Payer: Self-pay | Admitting: Podiatry

## 2020-04-17 DIAGNOSIS — M79676 Pain in unspecified toe(s): Secondary | ICD-10-CM

## 2020-04-17 DIAGNOSIS — B351 Tinea unguium: Secondary | ICD-10-CM | POA: Diagnosis not present

## 2020-04-17 DIAGNOSIS — I872 Venous insufficiency (chronic) (peripheral): Secondary | ICD-10-CM

## 2020-04-17 NOTE — Progress Notes (Signed)
She presents today with chief concern of painful elongated toenails neuropathy and her venous insufficiency.  Objective: Vital signs are stable alert oriented x3.  Pulses are palpable.  Hammertoe deformities hallux valgus deformities are noted bilateral.  Toenails are long thick yellow dystrophic clinically mycotic no open lesions or wounds are noted.  Assessment: Pain in limb secondary to venous insufficiency onychomycosis.  Plan: Discussed etiology outpatient surgical therapies this point time went ahead and wrote a prescription for compression hose knee-high's 20 to 30 mmHg at this point will not go to treat her neuropathy.  Debrided her nails bilaterally.  Follow-up with her in 6 months

## 2020-04-18 DIAGNOSIS — G629 Polyneuropathy, unspecified: Secondary | ICD-10-CM | POA: Diagnosis not present

## 2020-04-18 DIAGNOSIS — Z4689 Encounter for fitting and adjustment of other specified devices: Secondary | ICD-10-CM | POA: Diagnosis not present

## 2020-04-18 DIAGNOSIS — Z23 Encounter for immunization: Secondary | ICD-10-CM | POA: Diagnosis not present

## 2020-04-29 DIAGNOSIS — M545 Low back pain, unspecified: Secondary | ICD-10-CM | POA: Diagnosis not present

## 2020-04-29 DIAGNOSIS — M150 Primary generalized (osteo)arthritis: Secondary | ICD-10-CM | POA: Diagnosis not present

## 2020-04-29 DIAGNOSIS — M810 Age-related osteoporosis without current pathological fracture: Secondary | ICD-10-CM | POA: Diagnosis not present

## 2020-04-29 DIAGNOSIS — M2550 Pain in unspecified joint: Secondary | ICD-10-CM | POA: Diagnosis not present

## 2020-04-29 DIAGNOSIS — E663 Overweight: Secondary | ICD-10-CM | POA: Diagnosis not present

## 2020-04-29 DIAGNOSIS — Z6826 Body mass index (BMI) 26.0-26.9, adult: Secondary | ICD-10-CM | POA: Diagnosis not present

## 2020-04-29 DIAGNOSIS — R768 Other specified abnormal immunological findings in serum: Secondary | ICD-10-CM | POA: Diagnosis not present

## 2020-06-23 ENCOUNTER — Other Ambulatory Visit: Payer: Self-pay

## 2020-06-23 ENCOUNTER — Ambulatory Visit
Admission: RE | Admit: 2020-06-23 | Discharge: 2020-06-23 | Disposition: A | Discharge status: Home / Self Care | Payer: Medicare Other | Source: Ambulatory Visit | Attending: Obstetrics and Gynecology | Admitting: Obstetrics and Gynecology

## 2020-06-23 DIAGNOSIS — M810 Age-related osteoporosis without current pathological fracture: Secondary | ICD-10-CM

## 2020-06-23 DIAGNOSIS — M85851 Other specified disorders of bone density and structure, right thigh: Secondary | ICD-10-CM | POA: Diagnosis not present

## 2020-06-23 DIAGNOSIS — Z780 Asymptomatic menopausal state: Secondary | ICD-10-CM | POA: Diagnosis not present

## 2020-07-07 ENCOUNTER — Other Ambulatory Visit: Payer: Self-pay | Admitting: Cardiovascular Disease

## 2020-07-25 ENCOUNTER — Ambulatory Visit: Payer: Medicare Other | Admitting: Cardiovascular Disease

## 2020-08-03 NOTE — Progress Notes (Signed)
Cardiology Office Note:   Date:  08/04/2020  NAME:  Abigail Cohen    MRN: [id number] DOB:  06/07/1941   PCP:  Wenda Low, MD  Cardiologist:  Evalina Field, MD   Referring MD: Wenda Low, MD   Chief Complaint  Patient presents with  . Follow-up    History of Present Illness:   Abigail Cohen is a 80 y.o. female with a hx of AAA, coronary calcifications who presents for follow-up. Underwent open AAA repair last February. She reports she is doing well since surgery. She is on aspirin 81 mg a day. I have encouraged her to continue this. She is also on Crestor 10 mg a day. Her most recent lipid profile shows an LDL of 82. Recheck will be done seen by her primary care physician. BP slightly elevated 140/86. She is on metoprolol succinate 25 mg a day. She reports she is exercising 45 to 60 minutes/day on a glider bike. She reports no chest pain or shortness of breath. She is watching her salt intake. She overall is doing very well. Denies any chest pain, shortness of breath or palpitations in office.  Problem List 1. AAA (5.7 cm infrarenal) -s/p open repair 08/28/2019 2. 3-vessel CAD  -calcifications seen on CT -negative nuc med MPI 07/19/2019 -EF 55-60% 3. Former tobacco abuse  4. HTN 5. HLD -total cholesterol 168, HDL 75, LDL 82, triglycerides 61  Past Medical History: Past Medical History:  Diagnosis Date  . AAA (abdominal aortic aneurysm) ([place])   . Borderline hypertension   . Family history of anesthesia complication    daughter gets nauseated  . Hearing loss    wears hearing aids  . Hypertension   . Knee joint replacement status   . Osteoarthritis   . Osteopenia   . Presence of intracervical pessary   . Uterine prolapse   . Wears glasses   . Wears partial dentures    upper and lower partial    Past Surgical History: Past Surgical History:  Procedure Laterality Date  . ABDOMINAL AORTIC ANEURYSM REPAIR N/A 08/28/2019   Procedure: ANEURYSM ABDOMINAL AORTIC  REPAIR OPEN;  Surgeon: Angelia Mould, MD;  Location: [place];  Service: Vascular;  Laterality: N/A;  . COLONOSCOPY    . EYE SURGERY     cataracts removal  . HERNIA REPAIR  07/07/12   Dr Margot Chimes  . INGUINAL HERNIA REPAIR  07/07/2012   Procedure: HERNIA REPAIR INGUINAL ADULT;  Surgeon: Haywood Lasso, MD;  Location: [place];  Service: General;  Laterality: Left;  left inguinal area  . REPLACEMENT TOTAL KNEE BILATERAL  2003  . TONSILLECTOMY  1945  . WISDOM TOOTH EXTRACTION      Current Medications: Current Meds  Medication Sig  . acetaminophen (TYLENOL) 500 MG tablet Take 500 mg by mouth every 6 (six) hours as needed (ARTHRITIS PAIN.).  Marland Kitchen aspirin EC 81 MG tablet Take 1 tablet (81 mg total) by mouth daily.  . BENFOTIAMINE PO Take 300 mg by mouth daily.  Marland Kitchen CALCIUM CITRATE PO Take 1-2 tablets by mouth at bedtime.  . Cholecalciferol (VITAMIN D3) 10 MCG (400 UNIT) tablet Take 400 Units by mouth daily.  Marland Kitchen denosumab (PROLIA) 60 MG/ML SOSY injection Inject 60 mg into the skin every 6 (six) months.  Marland Kitchen estradiol (ESTRACE) 0.1 MG/GM vaginal cream Place 1 Applicatorful vaginally 2 (two) times a week.   . Magnesium 250 MG TABS Take 250 mg by mouth daily.   . Multiple  Vitamin (MULTIVITAMIN WITH MINERALS) TABS tablet Take 1 tablet by mouth daily.  . [DISCONTINUED] metoprolol succinate (TOPROL-XL) 25 MG 24 hr tablet Take 25 mg by mouth daily.  . [DISCONTINUED] rosuvastatin (CRESTOR) 10 MG tablet TAKE 1 TABLET(10 MG) BY MOUTH DAILY     Allergies:    Patient has no known allergies.   Social History: Social History   Socioeconomic History  . Marital status: Divorced    Spouse name: Not on file  . Number of children: 2  . Years of education: Not on file  . Highest education level: Not on file  Occupational History  . Occupation: retired Arts development officer  Tobacco Use  . Smoking status: Former Smoker    Years: 35.00    Types: Cigarettes    Start date: 07/05/2004    Quit date:  06/30/2005    Years since quitting: 15.1  . Smokeless tobacco: Never Used  Vaping Use  . Vaping Use: Never used  Substance and Sexual Activity  . Alcohol use: Yes    Comment: social  . Drug use: No  . Sexual activity: Not on file  Other Topics Concern  . Not on file  Social History Narrative  . Not on file   Social Determinants of Health   Financial Resource Strain: Not on file  Food Insecurity: Not on file  Transportation Needs: Not on file  Physical Activity: Not on file  Stress: Not on file  Social Connections: Not on file     Family History: The patient's family history includes Arrhythmia in her mother; Cancer in her sister; Heart attack in her father and mother; Heart disease in her father and mother.  ROS:   All other ROS reviewed and negative. Pertinent positives noted in the HPI.     EKGs/Labs/Other Studies Reviewed:   The following studies were personally reviewed by me today:   NM Stress 07/19/2019  The left ventricular ejection fraction is hyperdynamic (>65%).  Nuclear stress EF: 70%.  There was no ST segment deviation noted during stress.  The study is normal.  This is a low risk study.   1. Normal myocardial perfusion imaging study without evidence of ischemia or prior infarction. 2. Normal left ventricular ejection fraction, greater than 65%.   TTE 07/13/2019 1. Left ventricular ejection fraction, by visual estimation, is 55 to  60%. The left ventricle has normal function. There is mildly increased  left ventricular hypertrophy.  2. Global right ventricle has normal systolic function.The right  ventricular size is normal. No increase in right ventricular wall  thickness.  3. Left atrial size was normal.  4. Right atrial size was mildly dilated.  5. The mitral valve is normal in structure. Mild mitral valve  regurgitation.  6. The tricuspid valve is normal in structure. Mild to moderate  regurgitation.  7. The aortic valve is  tricuspid. Aortic valve regurgitation is not  visualized. No evidence of aortic valve stenosis.  8. The pulmonic valve was not well visualized. Pulmonic valve  regurgitation is trivial.  9. The tricuspid regurgitant velocity is 2.34 m/s, and with an assumed  right atrial pressure of 3 mmHg, the estimated right ventricular systolic  pressure is normal at 24.9 mmHg.  10. The inferior vena cava is normal in size with greater than 50%  respiratory variability, suggesting right atrial pressure of 3 mmHg.   Recent Labs: 08/29/2019: ALT 46; Magnesium 1.7 08/31/2019: BUN 6; Creatinine, Ser 0.57; Potassium 3.1; Sodium 134 09/03/2019: Hemoglobin 8.1; Platelets 160   Recent  Lipid Panel No results found for: CHOL, TRIG, HDL, CHOLHDL, VLDL, LDLCALC, LDLDIRECT  Physical Exam:   VS:  BP 140/86   Pulse 90   Ht 4\' 11"  (1.499 m)   Wt 128 lb 6.4 oz (58.2 kg)   LMP  (LMP Unknown)   SpO2 94%   BMI 25.93 kg/m    Wt Readings from Last 3 Encounters:  08/04/20 128 lb 6.4 oz (58.2 kg)  11/21/19 124 lb (56.2 kg)  09/19/19 122 lb (55.3 kg)    General: Well nourished, well developed, in no acute distress Head: Atraumatic, normal size  Eyes: PEERLA, EOMI  Neck: Supple, no JVD Endocrine: No thryomegaly Cardiac: Normal S1, S2; RRR; no murmurs, rubs, or gallops Lungs: Clear to auscultation bilaterally, no wheezing, rhonchi or rales  Abd: Soft, nontender, no hepatomegaly  Ext: No edema, pulses 2+ Musculoskeletal: No deformities, BUE and BLE strength normal and equal Skin: Warm and dry, no rashes   Neuro: Alert and oriented to person, place, time, and situation, CNII-XII grossly intact, no focal deficits  Psych: Normal mood and affect   ASSESSMENT:   Abigail Cohen is a 80 y.o. female who presents for the following: 1. AAA (abdominal aortic aneurysm) without rupture ([place])   2. Coronary artery disease involving native coronary artery of native heart without angina pectoris   3. Primary hypertension   4.  Mixed hyperlipidemia     PLAN:   1. AAA (abdominal aortic aneurysm) without rupture ([facility]) -status post open repair last year. Doing well. She will remain on aspirin and statin. Continue with exercise. Follow with vascular surgery.  2. Coronary artery disease involving native coronary artery of native heart without angina pectoris 3. Primary hypertension 4. Mixed hyperlipidemia -coronary calcification seen on CT chest last year. Normal echocardiogram. Normal nuclear medicine myocardial perfusion imaging study. She will continue on aspirin Crestor. Goal LDL cholesterol is less than 70. She will forward [place] results from her primary care physician. Blood pressure slightly elevated but she reports it is well controlled at home. Continue metoprolol succinate 25 mg daily.  Disposition: Return in about 1 year (around 08/04/2021).  Medication Adjustments/Labs and Tests Ordered: Current medicines are reviewed at length with the patient today.  Concerns regarding medicines are outlined above.  No orders of the defined types were placed in this encounter.  Meds ordered this encounter  Medications  . metoprolol succinate (TOPROL-XL) 25 MG 24 hr tablet    Sig: Take 1 tablet (25 mg total) by mouth daily.    Dispense:  90 tablet    Refill:  3  . rosuvastatin (CRESTOR) 10 MG tablet    Sig: TAKE 1 TABLET(10 MG) BY MOUTH DAILY    Dispense:  90 tablet    Refill:  3    Patient Instructions  Medication Instructions:  The current medical regimen is effective;  continue present plan and medications.  *If you need a refill on your cardiac medications before your next appointment, please call your pharmacy*   Follow-Up: At [facility], you and your health needs are our priority.  As part of our continuing mission to provide you with exceptional heart care, we have created designated Provider Care Teams.  These Care Teams include your primary Cardiologist (physician) and Advanced Practice Providers (APPs  -  Physician Assistants and Nurse Practitioners) who all work together to provide you with the care you need, when you need it.  We recommend signing up for the patient portal called "MyChart".  Sign up  information is provided on this After Visit Summary.  MyChart is used to connect with patients for Virtual Visits (Telemedicine).  Patients are able to view lab/test results, encounter notes, upcoming appointments, etc.  Non-urgent messages can be sent to your provider as well.   To learn more about what you can do with MyChart, go to [email or web address].    Your next appointment:   12 month(s)  The format for your next appointment:   In Person  Provider:   Eleonore Chiquito, MD       Time Spent with Patient: I have spent a total of 25 minutes with patient reviewing hospital notes, telemetry, EKGs, labs and examining the patient as well as establishing an assessment and plan that was discussed with the patient.  > 50% of time was spent in direct patient care.  Signed, Addison Naegeli. Audie Box, [place]  [address], [place], [place] [address] [phone]  08/04/2020 2:06 PM

## 2020-08-04 ENCOUNTER — Ambulatory Visit (INDEPENDENT_AMBULATORY_CARE_PROVIDER_SITE_OTHER): Payer: Medicare Other | Admitting: Cardiovascular Disease

## 2020-08-04 ENCOUNTER — Encounter: Payer: Self-pay | Admitting: Cardiovascular Disease

## 2020-08-04 ENCOUNTER — Other Ambulatory Visit: Payer: Self-pay

## 2020-08-04 VITALS — BP 140/86 | HR 90 | Ht 59.0 in | Wt 128.4 lb

## 2020-08-04 DIAGNOSIS — I714 Abdominal aortic aneurysm, without rupture, unspecified: Secondary | ICD-10-CM

## 2020-08-04 DIAGNOSIS — I2510 Atherosclerotic heart disease of native coronary artery without angina pectoris: Secondary | ICD-10-CM | POA: Diagnosis not present

## 2020-08-04 DIAGNOSIS — E782 Mixed hyperlipidemia: Secondary | ICD-10-CM

## 2020-08-04 DIAGNOSIS — I10 Essential (primary) hypertension: Secondary | ICD-10-CM

## 2020-08-04 MED ORDER — ROSUVASTATIN CALCIUM 10 MG PO TABS: ORAL_TABLET | ORAL | 3 refills | Status: DC

## 2020-08-04 MED ORDER — METOPROLOL SUCCINATE ER 25 MG PO TB24: 25.0000 mg | ORAL_TABLET | Freq: Every day | ORAL | 3 refills | Status: DC

## 2020-08-04 NOTE — Patient Instructions (Signed)

## 2020-08-06 ENCOUNTER — Other Ambulatory Visit: Payer: Self-pay

## 2020-08-07 ENCOUNTER — Ambulatory Visit: Payer: Medicare Other | Admitting: Internal Medicine

## 2020-08-07 ENCOUNTER — Encounter: Payer: Self-pay | Admitting: Internal Medicine

## 2020-08-07 ENCOUNTER — Ambulatory Visit (INDEPENDENT_AMBULATORY_CARE_PROVIDER_SITE_OTHER): Payer: Medicare Other | Admitting: Internal Medicine

## 2020-08-07 VITALS — BP 120/80 | HR 77 | Ht 59.0 in | Wt 128.6 lb

## 2020-08-07 DIAGNOSIS — G6289 Other specified polyneuropathies: Secondary | ICD-10-CM | POA: Diagnosis not present

## 2020-08-07 DIAGNOSIS — I2510 Atherosclerotic heart disease of native coronary artery without angina pectoris: Secondary | ICD-10-CM | POA: Diagnosis not present

## 2020-08-07 DIAGNOSIS — M810 Age-related osteoporosis without current pathological fracture: Secondary | ICD-10-CM | POA: Diagnosis not present

## 2020-08-07 LAB — BASIC METABOLIC PANEL
BUN: 32 mg/dL — ABNORMAL HIGH (ref 6–23)
CO2: 31 mEq/L (ref 19–32)
Calcium: 9.2 mg/dL (ref 8.4–10.5)
Chloride: 101 mEq/L (ref 96–112)
Creatinine, Ser: 0.75 mg/dL (ref 0.40–1.20)
GFR: 74.35 mL/min (ref >60.00)
Glucose, Bld: 88 mg/dL (ref 70–99)
Potassium: 4.2 mEq/L (ref 3.5–5.1)
Sodium: 137 mEq/L (ref 135–145)

## 2020-08-07 NOTE — Progress Notes (Signed)
Patient ID: Abigail Cohen, female   DOB: [birth year], 80 y.o.   MRN: [id number]   This visit occurred during the SARS-CoV-2 public health emergency.  Safety protocols were in place, including screening questions prior to the visit, additional usage of staff PPE, and extensive cleaning of exam room while observing appropriate contact time as indicated for disinfecting solutions.   HPI  Abigail Cohen is a 80 y.o.-year-old very pleasant female, returning for follow-up for osteoporosis.  Last visit 1 year ago.  Since last visit, she had AAA surgery in 08/2019.  Her back pain is almost entirely resolved afterwards.  She was diagnosed with osteoporosis in 2015.  Reviewed previous DXA scan reports: Date L1-L4 T score FN T score 33% distal Radius UD radius  06/23/2020 [facility]) n/a RFN: -1.4 LFN: -0.9 -3.3 -2.2  06/22/2018 ([facility]) n/a RFN: -1.4 LFN: -1.4 -3.1 -2.2  05/10/2016 ([facility]) n/a RFN: -2.0 LFN: -1.5 -3.1 -2.8  05/09/2014 ([facility]) n/a RFN: n/a LFN: -1.7 -2.6 -1.3  04/05/2012 ([facility]) n/a RFN: -1.7 -2.2   11/02/2006 ([facility]) n/a RFN: -1.6 -2.1   10/04/2001 ([facility])  -0.5 LFN: -0.8 n/a    No fractures since last visit.  She denies dizziness, vertigo, orthostasis, poor vision.  Reviewed osteoporotic treatment: - Fosamax 2015-06/2016 - Prolia: 08/2016 02/2017 09/2017 03/2018 09/13/2018 03/20/2019 10/02/2019 04/10/2020  She denies: Jaw, hip, thigh pain.  No history of vitamin D deficiency: Lab Results  Component Value Date   VD25OH 40.21 08/08/2019   VD25OH 47.97 08/02/2018   VD25OH 48.18 08/02/2017   VD25OH 41.40 08/04/2016  11/24/2015: vit D 49.2 06/03/2014: Vit D 39.7  She continues on: - vitamin D 400 units daily - calcium citrate 250-500 mg daily - multivitamin daily  She was previously doing weightbearing exercises - [facility] on [address] >> stopped before last visit.  She was then exercising at home on her  glider, but at last visit she had back pain and could not exercise >> now restarted exercise as back pain is resolved after the AAA surgery.  Menopause was in her 53s.  No family history of osteoporosis.  No history of kidney stones.  She had hypocalcemia after our last visit, but this was in the setting of aortic aneurysm repair surgery. Lab Results  Component Value Date   PTH 29 08/04/2016   CALCIUM 7.1 (L) 08/31/2019   CALCIUM 7.1 (L) 08/29/2019   CALCIUM 7.9 (L) 08/28/2019   CALCIUM 9.1 08/27/2019   CALCIUM 9.6 08/02/2018   CALCIUM 9.3 08/02/2017   CALCIUM 9.1 08/04/2016  04/28/2016: Calcium 9.8  No history of thyrotoxicosis: Lab Results  Component Value Date   TSH 0.54 08/04/2016  05/20/2014: Total T4 7.0 (4.5-12)  No history of CKD. Last BUN/Cr: 07/11/2019: reportedly normal GFR Lab Results  Component Value Date   BUN 6 (L) 08/31/2019   CREATININE 0.57 08/31/2019  06/18/2016: EGFR 78, BUN/creatinine 29/0.72  04/28/2016: EGFR 85: BUN/creatinine 24/0.67  She had one steroid injection for hip bursitis in the past, but not recently.  She has peripheral neuropathy which unclear etiology.  At our first visit, I advised her to start alpha-lipoic acid 600 mg twice a day,  and also benfothiamine.  She did well on these, but at this visit, she mentions that she is off ALA.  She started a statin (Crestor) and a BP medication (metoprolol).  ROS: Constitutional: no weight gain/no weight loss, no fatigue, no subjective hyperthermia, no subjective hypothermia Eyes: no blurry vision, no  xerophthalmia ENT: no sore throat, no nodules palpated in neck, no dysphagia, no odynophagia, no hoarseness Cardiovascular: no CP/no SOB/no palpitations/no leg swelling Respiratory: no cough/no SOB/no wheezing Gastrointestinal: no N/no V/no D/no C/no acid reflux Musculoskeletal: no muscle aches/no joint aches Skin: no rashes, no hair loss Neurological: no tremors/+ numbness/+ tingling/no  dizziness  I reviewed pt's medications, allergies, PMH, social hx, family hx, and changes were documented in the history of present illness. Otherwise, unchanged from my initial visit note.  Past Medical History:  Diagnosis Date  . AAA (abdominal aortic aneurysm) (HCC)   . Borderline hypertension   . Family history of anesthesia complication    daughter gets nauseated  . Hearing loss    wears hearing aids  . Hypertension   . Knee joint replacement status   . Osteoarthritis   . Osteopenia   . Presence of intracervical pessary   . Uterine prolapse   . Wears glasses   . Wears partial dentures    upper and lower partial   Past Surgical History:  Procedure Laterality Date  . ABDOMINAL AORTIC ANEURYSM REPAIR N/A 08/28/2019   Procedure: ANEURYSM ABDOMINAL AORTIC REPAIR OPEN;  Surgeon: Chuck Hint, MD;  Location: [facility];  Service: Vascular;  Laterality: N/A;  . COLONOSCOPY    . EYE SURGERY     cataracts removal  . HERNIA REPAIR  07/07/12   Dr Jamey Ripa  . INGUINAL HERNIA REPAIR  07/07/2012   Procedure: HERNIA REPAIR INGUINAL ADULT;  Surgeon: Currie Paris, MD;  Location: [facility];  Service: General;  Laterality: Left;  left inguinal area  . REPLACEMENT TOTAL KNEE BILATERAL  2003  . TONSILLECTOMY  1945  . WISDOM TOOTH EXTRACTION     Social History   Social History  . Marital status: Divorced    Spouse name: N/A  . Number of children: N/A  . Years of education: N/A   Occupational History  . Not on file.   Social History Main Topics  . Smoking status: Former Smoker    Start date: 07/05/2004    Quit date: 06/30/2005  . Smokeless tobacco: Never Used  . Alcohol use Yes     Comment: social  . Drug use: No   Current Outpatient Medications  Medication Sig Dispense Refill  . acetaminophen (TYLENOL) 500 MG tablet Take 500 mg by mouth every 6 (six) hours as needed (ARTHRITIS PAIN.).    Marland Kitchen aspirin EC 81 MG tablet Take 1 tablet (81 mg total) by mouth daily.     . BENFOTIAMINE PO Take 300 mg by mouth daily.    Marland Kitchen CALCIUM CITRATE PO Take 1-2 tablets by mouth at bedtime.    . Cholecalciferol (VITAMIN D3) 10 MCG (400 UNIT) tablet Take 400 Units by mouth daily.    Marland Kitchen denosumab (PROLIA) 60 MG/ML SOSY injection Inject 60 mg into the skin every 6 (six) months.    Marland Kitchen estradiol (ESTRACE) 0.1 MG/GM vaginal cream Place 1 Applicatorful vaginally 2 (two) times a week.     . Magnesium 250 MG TABS Take 250 mg by mouth daily.     . metoprolol succinate (TOPROL-XL) 25 MG 24 hr tablet Take 1 tablet (25 mg total) by mouth daily. 90 tablet 3  . Multiple Vitamin (MULTIVITAMIN WITH MINERALS) TABS tablet Take 1 tablet by mouth daily.    . rosuvastatin (CRESTOR) 10 MG tablet TAKE 1 TABLET(10 MG) BY MOUTH DAILY 90 tablet 3   No current facility-administered medications for this visit.   No  Known Allergies Family History  Problem Relation Age of Onset  . Heart disease Mother   . Heart attack Mother   . Arrhythmia Mother   . Heart disease Father   . Heart attack Father   . Cancer Sister        endometrial   PE: BP 120/80   Pulse 77   Ht $R'4\' 11"'rB$  (1.499 m)   Wt 128 lb 9.6 oz (58.3 kg)   LMP  (LMP Unknown)   SpO2 98%   BMI 25.97 kg/m  Wt Readings from Last 3 Encounters:  08/07/20 128 lb 9.6 oz (58.3 kg)  08/04/20 128 lb 6.4 oz (58.2 kg)  11/21/19 124 lb (56.2 kg)   Constitutional: normal weight, in NAD Eyes: PERRLA, EOMI, no exophthalmos ENT: moist mucous membranes, no thyromegaly, no cervical lymphadenopathy Cardiovascular: RRR, No MRG Respiratory: CTA B Gastrointestinal: abdomen soft, NT, ND, BS+ Musculoskeletal: no deformities, strength intact in all 4 Skin: moist, warm, no rashes Neurological: no tremor with outstretched hands, DTR normal in all 4  Assessment: 1. Osteoporosis  2.  Peripheral neuropathy  Plan: 1. Osteoporosis -Likely age-related + menopausal -Reviewed together her latest bone density report from 06/23/2020: Spine could not be  analyzed again, but there was significant increase in the level of the L femoral neck and stability at the 33% distal, ultra distal radius and right femoral neck.  This is a good result.  We discussed that either stability or increase in BMD are desirable outcomes while on Prolia. -She continues on Prolia, of which she had 8 injections, last on 04/10/2020. -She tolerates Prolia well, without jaw, thigh, hip pain.  No dental work in progress or upcoming. -She is on a good dose of calcium and vitamin D daily.  Latest vitamin D level was normal at last visit.  She tries to get 1000-1200 mg of calcium with diet today -No falls or fractures since last visit.  She is aware of fall precautions.  She walks outside with a 3 pronged cane. -At last visit, we discussed about starting weightbearing exercises but she could not do them due to back pain and then also pending aortic aneurysm repair.  Now she restarted to use the glider after her back pain resolved after surgery.  However, I advised him to combine this with other, weightbearing, exercises -we will check another vitamin D level and also a BMP now -She is due for another DXA scan in 06/2022 -I will see her back in 1 year  2.  Peripheral neuropathy -In both hands and feet -Unclear etiology -She does not have a history of diabetes, hypothyroidism, cancer/ChTx -She is off her alpha-lipoic acid as she forgot about it.  I advised her to restart at 600 mg twice a day.  Also, continue benfotiamine. -She asks me about my opinion whether she needs to see a neurologist or not.  I advised her that if the neuropathy is stable and not too bothersome, she may not need to see a neurologist.  She tells me that she had it for many years and is not progressive.  Orders Placed This Encounter  Procedures  . Basic metabolic panel  . VITAMIN D 25 Hydroxy (Vit-D Deficiency, Fractures)   Component     Latest Ref Rng & Units 08/07/2020          Sodium     135 - 145  mEq/L 137  Potassium     3.5 - 5.1 mEq/L 4.2  Chloride  96 - 112 mEq/L 101  CO2     19 - 32 mEq/L 31  Glucose     70 - 99 mg/dL 88  BUN     6 - 23 mg/dL 32 (H)  Creatinine     0.40 - 1.20 mg/dL 0.75  GFR     >60.00 mL/min 74.35  Calcium     8.4 - 10.5 mg/dL 9.2  Vitamin D, 25-Hydroxy     30.0 - 100.0 ng/mL 54.4  Labs are at goal. We can continue with Prolia.  Philemon Kingdom, MD PhD [facility]

## 2020-08-07 NOTE — Patient Instructions (Signed)
Please stop at the lab.  Please come back for a follow-up appointment in 1 year.  

## 2020-08-08 LAB — VITAMIN D 25 HYDROXY (VIT D DEFICIENCY, FRACTURES): Vit D, 25-Hydroxy: 54.4 ng/mL (ref 30.0–100.0)

## 2020-09-12 DIAGNOSIS — Z4689 Encounter for fitting and adjustment of other specified devices: Secondary | ICD-10-CM | POA: Diagnosis not present

## 2020-09-18 ENCOUNTER — Ambulatory Visit (INDEPENDENT_AMBULATORY_CARE_PROVIDER_SITE_OTHER): Payer: Medicare Other

## 2020-09-18 ENCOUNTER — Encounter: Payer: Self-pay | Admitting: Podiatry

## 2020-09-18 ENCOUNTER — Ambulatory Visit (INDEPENDENT_AMBULATORY_CARE_PROVIDER_SITE_OTHER): Payer: Medicare Other | Admitting: Podiatry

## 2020-09-18 ENCOUNTER — Other Ambulatory Visit: Payer: Self-pay

## 2020-09-18 DIAGNOSIS — Z8679 Personal history of other diseases of the circulatory system: Secondary | ICD-10-CM | POA: Diagnosis not present

## 2020-09-18 DIAGNOSIS — I10 Essential (primary) hypertension: Secondary | ICD-10-CM | POA: Diagnosis not present

## 2020-09-18 DIAGNOSIS — Z0000 Encounter for general adult medical examination without abnormal findings: Secondary | ICD-10-CM | POA: Diagnosis not present

## 2020-09-18 DIAGNOSIS — Z9889 Other specified postprocedural states: Secondary | ICD-10-CM | POA: Diagnosis not present

## 2020-09-18 DIAGNOSIS — G629 Polyneuropathy, unspecified: Secondary | ICD-10-CM | POA: Diagnosis not present

## 2020-09-18 DIAGNOSIS — I2510 Atherosclerotic heart disease of native coronary artery without angina pectoris: Secondary | ICD-10-CM

## 2020-09-18 DIAGNOSIS — E785 Hyperlipidemia, unspecified: Secondary | ICD-10-CM | POA: Diagnosis not present

## 2020-09-18 DIAGNOSIS — B351 Tinea unguium: Secondary | ICD-10-CM

## 2020-09-18 DIAGNOSIS — I700 Atherosclerosis of aorta: Secondary | ICD-10-CM | POA: Diagnosis not present

## 2020-09-18 DIAGNOSIS — M79676 Pain in unspecified toe(s): Secondary | ICD-10-CM | POA: Diagnosis not present

## 2020-09-18 DIAGNOSIS — M722 Plantar fascial fibromatosis: Secondary | ICD-10-CM

## 2020-09-18 DIAGNOSIS — M810 Age-related osteoporosis without current pathological fracture: Secondary | ICD-10-CM | POA: Diagnosis not present

## 2020-09-18 DIAGNOSIS — Z1389 Encounter for screening for other disorder: Secondary | ICD-10-CM | POA: Diagnosis not present

## 2020-09-18 DIAGNOSIS — M5136 Other intervertebral disc degeneration, lumbar region: Secondary | ICD-10-CM | POA: Diagnosis not present

## 2020-09-18 MED ORDER — TRIAMCINOLONE ACETONIDE 40 MG/ML IJ SUSP
20.0000 mg | Freq: Once | INTRAMUSCULAR | Status: AC
  Administered 2020-09-18: 20 mg

## 2020-09-18 NOTE — Progress Notes (Signed)
She presents today chief complaint of painfully elongated toenails and a painful heel left.  Objective: Vital signs are stable alert oriented x3.  Pulses are palpable.  Her toenails are long thick yellow dystrophic Lee mycotic 1 through 5 bilaterally.  She also has pain on palpation plantar medial aspect of the heel.  Radiographs do not demonstrate any type of fracture.  Assessment: Fasciitis.  Onychomycosis.  Plan: Injected the left heel today 20 mg Kenalog 5 mg Marcaine point maximal tenderness.  Also I debrided her nails 1 through 5 bilateral.

## 2020-09-22 ENCOUNTER — Telehealth: Payer: Self-pay

## 2020-09-22 LAB — CBC: RBC: 4.15 (ref 3.87–5.11)

## 2020-09-22 LAB — BASIC METABOLIC PANEL
BUN: 26 — AB (ref 4–21)
CO2: 33 — AB (ref 13–22)
Chloride: 102 (ref 99–108)
Creatinine: 0.7 (ref 0.5–1.1)
Glucose: 94
Potassium: 4.5 (ref 3.4–5.3)
Sodium: 139 (ref 137–147)

## 2020-09-22 LAB — HEPATIC FUNCTION PANEL
ALT: 17 (ref 7–35)
AST: 26 (ref 13–35)

## 2020-09-22 LAB — LIPID PANEL
Cholesterol: 145 (ref 0–200)
HDL: 75 — AB (ref 35–70)
LDL Cholesterol: 63
Triglycerides: 51 (ref 40–160)

## 2020-09-22 LAB — CBC AND DIFFERENTIAL
HCT: 40 (ref 36–46)
Hemoglobin: 13.3 (ref 12.0–16.0)
WBC: 6.2

## 2020-09-22 LAB — COMPREHENSIVE METABOLIC PANEL
Albumin: 4.3 (ref 3.5–5.0)
Calcium: 10 (ref 8.7–10.7)
GFR calc Af Amer: 95
GFR calc non Af Amer: 79

## 2020-09-22 LAB — TSH: TSH: 1.19 (ref 0.41–5.90)

## 2020-09-22 NOTE — Telephone Encounter (Signed)
Last OV 08/07/20 Last inj 04/10/20 Next inj due 10/10/20  VOB initiated via [employer] portal

## 2020-09-29 NOTE — Telephone Encounter (Signed)
Pt scheduled 10/10/20  Out-of-pocket cost at time of visit: $0.00  Medicare VOB Prolia co-insurance: 20% Admin fee co-insurance: 20%  A[employer]na Medicare Supplement VOB Prolia co-insurance: covers medicare deductible, co-insurance, and 100% of excess charges Admin fee co=insurance: covers medicare deductible, co-insurance, and 100% of excess charges

## 2020-09-29 NOTE — Telephone Encounter (Signed)
Prolia VOB  PRIMARY MEDICAL BENEFIT DETAILS (PHYSICIAN PURCHASE, OR REFERRAL TO TREATING SITE) COVERAGE AVAILABLE: Yes  COVERAGE DETAILS: Benefits subject to a $233.00 deductible ($233.00 met) and 20% co-insurance for the administration and cost of Prolia. The benefits provided on this Verification of Benefits form are Medical Benefits and are the patient's In-Network benefits for Prolia. If you would like Pharmacy Benefits for Prolia, please call [phone].  AUTHORIZATION REQUIRED: No

## 2020-10-10 ENCOUNTER — Ambulatory Visit (INDEPENDENT_AMBULATORY_CARE_PROVIDER_SITE_OTHER): Payer: Medicare Other

## 2020-10-10 ENCOUNTER — Other Ambulatory Visit: Payer: Self-pay

## 2020-10-10 DIAGNOSIS — M810 Age-related osteoporosis without current pathological fracture: Secondary | ICD-10-CM

## 2020-10-10 MED ORDER — DENOSUMAB 60 MG/ML ~~LOC~~ SOSY
60.0000 mg | PREFILLED_SYRINGE | Freq: Once | SUBCUTANEOUS | Status: AC
  Administered 2020-10-10: 60 mg via SUBCUTANEOUS

## 2020-10-10 NOTE — Progress Notes (Signed)
Prolia injection administered to pt's left arm. Pt tolerated well. °

## 2020-10-21 ENCOUNTER — Ambulatory Visit: Payer: Medicare Other | Admitting: Podiatry

## 2020-10-28 DIAGNOSIS — R768 Other specified abnormal immunological findings in serum: Secondary | ICD-10-CM | POA: Diagnosis not present

## 2020-10-28 DIAGNOSIS — M150 Primary generalized (osteo)arthritis: Secondary | ICD-10-CM | POA: Diagnosis not present

## 2020-10-28 DIAGNOSIS — M545 Low back pain, unspecified: Secondary | ICD-10-CM | POA: Diagnosis not present

## 2020-10-28 DIAGNOSIS — Z6826 Body mass index (BMI) 26.0-26.9, adult: Secondary | ICD-10-CM | POA: Diagnosis not present

## 2020-10-28 DIAGNOSIS — E663 Overweight: Secondary | ICD-10-CM | POA: Diagnosis not present

## 2020-10-28 DIAGNOSIS — M2550 Pain in unspecified joint: Secondary | ICD-10-CM | POA: Diagnosis not present

## 2020-10-29 NOTE — Progress Notes (Signed)
Phone: [phone]   Subjective:  Patient presents today to establish care.  Prior patient of  Dr. Lysle Rubens.  Chief Complaint  Patient presents with  . New Patient (Initial Visit)    Establishing care. She has a "big question" about traveling.   - osteoporosis -hyperlipidemia  See problem oriented charting  The following were reviewed and entered/updated in epic: Past Medical History:  Diagnosis Date  . AAA (abdominal aortic aneurysm) ([place])   . Allergy   . Essential hypertension h  . Family history of anesthesia complication    daughter gets nauseated  . Hearing loss    wears hearing aids  . Hypertension   . Knee joint replacement status   . Osteoarthritis   . Osteopenia   . Osteoporosis   . Presence of intracervical pessary   . Uterine prolapse   . Wears glasses   . Wears partial dentures    upper and lower partial   Patient Active Problem List   Diagnosis Date Noted  . Abdominal aortic aneurysm (AAA) ([place]) 08/28/2019    Priority: High  . Hyperlipidemia 10/30/2020    Priority: Medium  . Essential hypertension 10/30/2020    Priority: Medium  . Osteoporosis 08/02/2016    Priority: Medium  . Allergic rhinitis 10/30/2020    Priority: Low  . Left inguinal hernia 06/20/2012    Priority: Low  . Osteoarthritis 10/30/2020   Past Surgical History:  Procedure Laterality Date  . ABDOMINAL AORTIC ANEURYSM REPAIR N/A 08/28/2019   Procedure: ANEURYSM ABDOMINAL AORTIC REPAIR OPEN;  Surgeon: Angelia Mould, MD;  Location: [place];  Service: Vascular;  Laterality: N/A;  . COLONOSCOPY    . EYE SURGERY     cataracts removal  . HERNIA REPAIR  07/07/12   Dr Margot Chimes  . INGUINAL HERNIA REPAIR  07/07/2012   Procedure: HERNIA REPAIR INGUINAL ADULT;  Surgeon: Haywood Lasso, MD;  Location: [place];  Service: General;  Laterality: Left;  left inguinal area  . REPLACEMENT TOTAL KNEE BILATERAL  2003  . TONSILLECTOMY  1945  . WISDOM TOOTH EXTRACTION       Family History  Problem Relation Age of Onset  . Heart disease Mother   . Heart attack Mother        85s  . Arrhythmia Mother   . Heart disease Father        45s  . Heart attack Father   . Cancer Sister        endometrial    Medications- reviewed and updated Current Outpatient Medications  Medication Sig Dispense Refill  . acetaminophen (TYLENOL) 500 MG tablet Take 500 mg by mouth every 6 (six) hours as needed (ARTHRITIS PAIN.).    Marland Kitchen Alpha Lipoic Acid 200 MG CAPS See admin instructions.    Marland Kitchen aspirin EC 81 MG tablet Take 1 tablet (81 mg total) by mouth daily.    . BENFOTIAMINE PO Take 300 mg by mouth daily.    Marland Kitchen CALCIUM CITRATE PO Take 1-2 tablets by mouth at bedtime.    . Cholecalciferol (VITAMIN D3) 10 MCG (400 UNIT) tablet Take 400 Units by mouth daily.    Marland Kitchen denosumab (PROLIA) 60 MG/ML SOSY injection Inject 60 mg into the skin every 6 (six) months.    Marland Kitchen estradiol (ESTRACE) 0.1 MG/GM vaginal cream Place 1 Applicatorful vaginally 2 (two) times a week.     . Magnesium 250 MG TABS Take 250 mg by mouth daily.     . metoprolol succinate (TOPROL-XL) 25  MG 24 hr tablet Take 1 tablet (25 mg total) by mouth daily. 90 tablet 3  . Multiple Vitamin (MULTIVITAMIN WITH MINERALS) TABS tablet Take 1 tablet by mouth daily.    . polyethylene glycol (MIRALAX / GLYCOLAX) 17 g packet 1/2 packet mixed with 8 ounces of fluid    . rosuvastatin (CRESTOR) 10 MG tablet TAKE 1 TABLET(10 MG) BY MOUTH DAILY 90 tablet 3   No current facility-administered medications for this visit.    Allergies-reviewed and updated Allergies  Allergen Reactions  . Penicillins     Blood shot eyes, felt poorly.  Tolerates amoxicillin with dentist    Social History   Social History Narrative   Divorced- ex husband now deceased. Lives alone in townhome.    Daughter lives in town, son lives in town.       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 Oropharynx normal. TM normal. Eyes: sclera and lids normal, PERRLA Neck: no thyromegaly, no cervical lymphadenopathy CV: RRR no murmurs rubs or gallops Lungs: CTAB no crackles, wheeze, rhonchi Abdomen: soft/nontender/nondistended/normal bowel sounds. No rebound or guarding.  Ext: no edema Skin: warm, dry Neuro: 5/5 strength in upper and lower extremities, walks with cane or assisted by me to get onto table Scoliosis noted   Assessment and Plan:   #Abdominal aortic aneurysm without rupture with history of open repair-follows with Dr. Audie Box of cardiology as well as vascular surgery S:Medication: Aspirin and statin recommended per cardiology. A/P: stable with close cardiology and vascular follow up- continue current meds   #Coronary artery disease based on coronary artery calcification seen on CT chest with reassuring stress test and echocardiogram-follows with Dr. Davina Poke of cardiology #hyperlipidemia- LDL goal under 70 S: Medication:Rosuvastatin 10 mg, asa 81 mg Lab Results  Component Value Date   CHOL 145 09/22/2020   HDL 75 (A) 09/22/2020   [place] 63 09/22/2020   TRIG 51 09/22/2020  A/P: CAD appears asymptomatic. Lipids at goal with LDL <70 from labs from prior PCP- continue current meds  #hypertension S: medication: Metoprolol succinate 25 mg twice daily BP Readings from Last 3 Encounters:  10/30/20 132/84  08/07/20 120/80  08/04/20 140/86  A/P: Stable. Continue current medications.    # Osteoporosis- with Dr. Cruzita Lederer S: Last DEXA: 06/23/2020  Medication (bisphosphonate or prolia): Prolia  Calcium: 1200mg  (through diet ok) recommended  Vitamin D: 1000 units a day recommended Last  vitamin D-greater than 50 in February 2022  A/P: Stable. Continue current medications.    #Osteoarthritis- follows with Dr. Trudie Reed of rheumatology. Bilateral knee replacements, hammer toes, bunions, back, scoliosis. No regular rx  -dental prophylaxis from dentist- started out with orthopedist  # former smoker- quit in 2006- 35 years at least. Get UA with yearly labs - will plan on next or two  #Pessary through ob/gyn and also on estrace cream   # neuropathy- on alpha lipoic acid through Dr. Cruzita Lederer originally patient reports  #Travel concerns- 2 train trips with over 30 hour train travel prepared, 2 plane flights long, 2 shorter  train trips as well. She is concerned due to covid 19. Trip planned June 7th - due to change in mask mandate plan and prolonged travel and with her heart condition would be reasonable to lower risk by cancelling travel- she will reach out if needs a letter  #Immunizations Immunization History  Administered Date(s) Administered  . Influenza Split 05/18/2007, 06/07/2012, 04/19/2019, 04/21/2020  . Influenza, High Dose Seasonal PF 04/25/2013, 07/06/2014, 04/22/2015, 04/28/2016, 04/29/2017, 05/02/2018, 04/19/2019  . PFIZER(Purple Top)SARS-COV-2 Vaccination 07/14/2019, 08/04/2019, 03/21/2020  . Pneumococcal Conjugate-13 04/22/2015  . Pneumococcal Polysaccharide-23 08/02/2006  . Tdap 02/03/2011  . Zoster 02/03/2011  Covid 19 vaccine #4- pleaes let us know when you get this.   Please check with your pharmacy to see if they have the shingrix vaccine. If they do- please get this immunization and update [place] by phone call or mychart with dates you receive the vaccine  New vaccine prevnar 20 now recommended for anyone over 52 for pneumonia at pharmacy   Recommended follow up: 6 month follow up or sooner if needed Future Appointments  Date Time Provider [place]  11/26/2020  3:00 PM [facility]  11/26/2020  3:40 PM Angelia Mould, MD  [facility]  03/26/2021  1:15 PM Hyatt, Max T, DPM [facility]  08/13/2021  1:00 PM Philemon Kingdom, MD [facility] None   Time Spent: 61 minutes of total time (1:37 PM- 2:38 PM) was spent on the date of the encounter performing the following actions: chart review prior to seeing the patient, obtaining history, performing a medically necessary exam, reviewing history together in epic, discussing travel concerns and immunizations, and documenting in our EHR.   Return precautions advised. Garret Reddish, MD

## 2020-10-29 NOTE — Patient Instructions (Addendum)
Immunization discussion Covid 19 vaccine #4- pleaes let us know when you get this.   Please check with your pharmacy to see if they have the shingrix vaccine. If they do- please get this immunization and update [place] by phone call or mychart with dates you receive the vaccine  New vaccine prevnar 20 now recommended for anyone over 3 for pneumonia at pharmacy  Mineral oil for ear full of wax Purchase mineral oil from laxative aisle Lay down on your side with ear that is bothering you facing up Use 3-4 drops with a dropper and place in ear for 30 seconds Place cotton swab outside of ear Turn to other side and allow this to drain Repeat 3-4 x a day Return to see [place] if not improving within a few days   Recommended follow up: 6 month follow up or sooner if needed  Call to cancel visit with Dr. Lysle Rubens

## 2020-10-30 ENCOUNTER — Encounter: Payer: Self-pay | Admitting: Family Medicine

## 2020-10-30 ENCOUNTER — Other Ambulatory Visit: Payer: Self-pay

## 2020-10-30 ENCOUNTER — Telehealth: Payer: Self-pay

## 2020-10-30 ENCOUNTER — Ambulatory Visit (INDEPENDENT_AMBULATORY_CARE_PROVIDER_SITE_OTHER): Payer: Medicare Other | Admitting: Family Medicine

## 2020-10-30 DIAGNOSIS — I2510 Atherosclerotic heart disease of native coronary artery without angina pectoris: Secondary | ICD-10-CM

## 2020-10-30 DIAGNOSIS — E785 Hyperlipidemia, unspecified: Secondary | ICD-10-CM

## 2020-10-30 DIAGNOSIS — J301 Allergic rhinitis due to pollen: Secondary | ICD-10-CM

## 2020-10-30 DIAGNOSIS — M1990 Unspecified osteoarthritis, unspecified site: Secondary | ICD-10-CM | POA: Insufficient documentation

## 2020-10-30 DIAGNOSIS — M159 Polyosteoarthritis, unspecified: Secondary | ICD-10-CM | POA: Diagnosis not present

## 2020-10-30 DIAGNOSIS — J309 Allergic rhinitis, unspecified: Secondary | ICD-10-CM | POA: Insufficient documentation

## 2020-10-30 DIAGNOSIS — I10 Essential (primary) hypertension: Secondary | ICD-10-CM

## 2020-10-30 MED ORDER — METOPROLOL SUCCINATE ER 25 MG PO TB24: 25.0000 mg | ORAL_TABLET | Freq: Every day | ORAL | 3 refills | Status: DC

## 2020-10-30 NOTE — Telephone Encounter (Signed)
FYI Patient established care today with Dr. Yong Channel.  Patient states she has two more 90 day refills left on the metoprolol succinate 25 mg.  States she does not need a new script sent to her pharmacy right away.  States rx date was 08/30/2020.

## 2020-10-30 NOTE — Telephone Encounter (Signed)
Noted  

## 2020-11-03 ENCOUNTER — Telehealth: Payer: Self-pay

## 2020-11-03 NOTE — Telephone Encounter (Signed)
Is it okay for me to type this up for the patient ?

## 2020-11-03 NOTE — Telephone Encounter (Signed)
Patient states she needs a medical letter that she is unable to travel due to - Medical reasons - States she spoke with dr.hunter regarding this during initial appt.

## 2020-11-04 NOTE — Telephone Encounter (Signed)
To whom it may concern,  Considering patient's age and comorbidities as well as recent change in mask mandates for travel, I have advised patient to avoid travel and higher risk situations such as trains and planes.   Thanks, Garret Reddish

## 2020-11-04 NOTE — Telephone Encounter (Signed)
Patient has been notified that her letter is ready. States that she will pick it up today.

## 2020-11-04 NOTE — Telephone Encounter (Signed)
Letter has been printed  ?

## 2020-11-26 ENCOUNTER — Other Ambulatory Visit: Payer: Self-pay

## 2020-11-26 ENCOUNTER — Ambulatory Visit (INDEPENDENT_AMBULATORY_CARE_PROVIDER_SITE_OTHER): Payer: Medicare Other | Admitting: Vascular Surgery

## 2020-11-26 ENCOUNTER — Encounter: Payer: Self-pay | Admitting: Vascular Surgery

## 2020-11-26 ENCOUNTER — Ambulatory Visit (HOSPITAL_COMMUNITY)
Admission: RE | Admit: 2020-11-26 | Discharge: 2020-11-26 | Disposition: A | Discharge status: Home / Self Care | Payer: Medicare Other | Source: Ambulatory Visit | Attending: Vascular Surgery | Admitting: Vascular Surgery

## 2020-11-26 VITALS — BP 131/83 | HR 73 | Temp 98.1°F | Resp 20 | Ht <= 58 in | Wt 126.0 lb

## 2020-11-26 DIAGNOSIS — I2510 Atherosclerotic heart disease of native coronary artery without angina pectoris: Secondary | ICD-10-CM

## 2020-11-26 DIAGNOSIS — I714 Abdominal aortic aneurysm, without rupture, unspecified: Secondary | ICD-10-CM

## 2020-11-26 DIAGNOSIS — Z23 Encounter for immunization: Secondary | ICD-10-CM | POA: Diagnosis not present

## 2020-11-26 DIAGNOSIS — Z48812 Encounter for surgical aftercare following surgery on the circulatory system: Secondary | ICD-10-CM | POA: Diagnosis not present

## 2020-11-26 NOTE — Progress Notes (Signed)
REASON FOR VISIT:   Follow-up after open repair of her abdominal aortic aneurysm.  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  She is doing well and has resumed her normal activities.  She has normal pedal pulses and normal Doppler studies.  I will see her back as needed.  I do not think she needs routine surveillance unless she develops new symptoms.   HPI:   Abigail Cohen is a pleasant 80 y.o. female who comes in for a yearly follow-up visit.  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  She had an aortobiiliac bypass graft.  I last saw her in follow-up on 11/21/2019 and she was doing well.  I felt that she could gradually resume her normal activities but that she should avoid heavy lifting for 6 months.  She comes in for a 1 year follow-up visit.  Since I saw her last, she denies any history of claudication, rest pain, or nonhealing ulcers.  She has had no significant abdominal pain or back pain.  Past Medical History:  Diagnosis Date  . AAA (abdominal aortic aneurysm) ([place])   . Allergy   . Essential hypertension h  . Family history of anesthesia complication    daughter gets nauseated  . Hearing loss    wears hearing aids  . Hypertension   . Knee joint replacement status   . Osteoarthritis   . Osteopenia   . Osteoporosis   . Presence of intracervical pessary   . Uterine prolapse   . Wears glasses   . Wears partial dentures    upper and lower partial    Family History  Problem Relation Age of Onset  . Heart disease Mother   . Heart attack Mother        2s  . Arrhythmia Mother   . Heart disease Father        55s  . Heart attack Father   . Cancer Sister        endometrial    SOCIAL HISTORY: Social History   Tobacco Use  . Smoking status: Former Smoker    Years: 35.00     Types: Cigarettes    Start date: 07/05/2004    Quit date: 06/30/2005    Years since quitting: 15.4  . Smokeless tobacco: Never Used  Substance Use Topics  . Alcohol use: Yes    Alcohol/week: 7.0 standard drinks    Types: 7 Glasses of wine per week    Allergies  Allergen Reactions  . Penicillins     Blood shot eyes, felt poorly.  Tolerates amoxicillin with dentist    Current Outpatient Medications  Medication Sig Dispense Refill  . acetaminophen (TYLENOL) 500 MG tablet Take 500 mg by mouth every 6 (six) hours as needed (ARTHRITIS PAIN.).    Marland Kitchen Alpha Lipoic Acid 200 MG CAPS See admin instructions.    Marland Kitchen aspirin EC 81 MG tablet Take 1 tablet (81 mg total) by mouth daily.    . BENFOTIAMINE PO Take 300 mg by mouth daily.    Marland Kitchen CALCIUM CITRATE PO Take 1-2 tablets by mouth at bedtime.    . Cholecalciferol (VITAMIN D3) 10 MCG (400 UNIT) tablet Take 400 Units by mouth daily.    Marland Kitchen denosumab (PROLIA) 60 MG/ML SOSY injection Inject 60 mg into the skin every 6 (six) months.    Marland Kitchen  estradiol (ESTRACE) 0.1 MG/GM vaginal cream Place 1 Applicatorful vaginally 2 (two) times a week.     . Magnesium 250 MG TABS Take 250 mg by mouth daily.     . metoprolol succinate (TOPROL-XL) 25 MG 24 hr tablet Take 1 tablet (25 mg total) by mouth daily. 90 tablet 3  . Multiple Vitamin (MULTIVITAMIN WITH MINERALS) TABS tablet Take 1 tablet by mouth daily.    . polyethylene glycol (MIRALAX / GLYCOLAX) 17 g packet 1/2 packet mixed with 8 ounces of fluid    . rosuvastatin (CRESTOR) 10 MG tablet TAKE 1 TABLET(10 MG) BY MOUTH DAILY 90 tablet 3   No current facility-administered medications for this visit.    REVIEW OF SYSTEMS:  [X]  denotes positive finding, [ ]  denotes negative finding Cardiac  Comments:  Chest pain or chest pressure:    Shortness of breath upon exertion: x   Short of breath when lying flat:    Irregular heart rhythm:        Vascular    Pain in calf, thigh, or hip brought on by ambulation:     Pain in feet at night that wakes you up from your sleep:     Blood clot in your veins:    Leg swelling:         Pulmonary    Oxygen at home:    Productive cough:     Wheezing:         Neurologic    Sudden weakness in arms or legs:     Sudden numbness in arms or legs:     Sudden onset of difficulty speaking or slurred speech:    Temporary loss of vision in one eye:     Problems with dizziness:         Gastrointestinal    Blood in stool:     Vomited blood:         Genitourinary    Burning when urinating:     Blood in urine:        Psychiatric    Major depression:         Hematologic    Bleeding problems:    Problems with blood clotting too easily:        Skin    Rashes or ulcers:        Constitutional    Fever or chills:     PHYSICAL EXAM:   Vitals:   11/26/20 1545  BP: 131/83  Pulse: 73  Resp: 20  Temp: 98.1 F (36.7 C)  SpO2: 95%  Weight: 126 lb (57.2 kg)  Height: 4\' 10"  (1.473 m)    GENERAL: The patient is a well-nourished female, in no acute distress. The vital signs are documented above. CARDIAC: There is a regular rate and rhythm.  VASCULAR: I do not detect carotid bruits. He has palpable femoral and pedal pulses bilaterally. PULMONARY: There is good air exchange bilaterally without wheezing or rales. ABDOMEN: She has some diastases of her abdominal wall.  This is not bothering her. MUSCULOSKELETAL: There are no major deformities or cyanosis. NEUROLOGIC: No focal weakness or paresthesias are detected. SKIN: There are no ulcers or rashes noted. PSYCHIATRIC: The patient has a normal affect.  DATA:    ARTERIAL DOPPLER STUDY: I have independently interpreted her arterial Doppler study today.  On the right side there is a triphasic posterior tibial and dorsalis pedis signal with an ABI of 100%.  Toe pressures 82 mmHg.  On the left side there is a  triphasic dorsalis pedis and posterior tibial signal.  ABI is 100% with a toe pressure of 83  mmHg.  [name] [facility] [phone]

## 2020-12-12 DIAGNOSIS — N952 Postmenopausal atrophic vaginitis: Secondary | ICD-10-CM | POA: Diagnosis not present

## 2020-12-12 DIAGNOSIS — Z4689 Encounter for fitting and adjustment of other specified devices: Secondary | ICD-10-CM | POA: Diagnosis not present

## 2020-12-18 ENCOUNTER — Ambulatory Visit (INDEPENDENT_AMBULATORY_CARE_PROVIDER_SITE_OTHER): Payer: Medicare Other | Admitting: Podiatry

## 2020-12-18 ENCOUNTER — Other Ambulatory Visit: Payer: Self-pay

## 2020-12-18 ENCOUNTER — Encounter: Payer: Self-pay | Admitting: Podiatry

## 2020-12-18 DIAGNOSIS — M722 Plantar fascial fibromatosis: Secondary | ICD-10-CM | POA: Diagnosis not present

## 2020-12-18 MED ORDER — TRIAMCINOLONE ACETONIDE 10 MG/ML IJ SUSP
10.0000 mg | Freq: Once | INTRAMUSCULAR | Status: AC
  Administered 2020-12-18: 10 mg

## 2020-12-18 NOTE — Progress Notes (Signed)
Subjective:   Patient ID: Abigail Cohen, female   DOB: 80 y.o.   MRN: [id number]   HPI Patient has developed recent reoccurrence of pain in the plantar aspect of the left heel   ROS      Objective:  Physical Exam  Acute.  Upon palpation of the left plantar fascia at insertion     Assessment:  Acute Planter fasciitis left     Plan:  Sterile prep injected the plantar fascial left 3 mg Kenalog 5 mg Xylocaine advised on supportive therapy reappoint to recheck

## 2020-12-25 ENCOUNTER — Telehealth: Payer: Self-pay | Admitting: Family Medicine

## 2020-12-25 NOTE — Telephone Encounter (Signed)
Copied from [place] [id number]. Topic: Medicare AWV >> Dec 25, 2020  1:59 PM Harris-Coley, Hannah Beat wrote: Reason for CRM: Left message for patient to schedule Annual Wellness Visit.  Please schedule with Health Nurse Advisor Augustine Radar. at [facility].

## 2020-12-30 ENCOUNTER — Telehealth: Payer: Self-pay

## 2020-12-30 NOTE — Telephone Encounter (Signed)
Called and spoke with pt and gave below message.

## 2020-12-30 NOTE — Telephone Encounter (Signed)
See below

## 2020-12-30 NOTE — Telephone Encounter (Signed)
Most likely patient will be okay without medication-unfortunately do not have a way to get medications to her in [place] so I am not sure what other option we have unless she could stop by one of the clinics or urgent cares and be prescribed medication

## 2020-12-30 NOTE — Telephone Encounter (Signed)
Patient called in and stated she had left some of her BP medication at home and patient is in [place] and wants to know if she will be ok without taking it for a few days till she gets back to [place].

## 2021-01-06 DIAGNOSIS — Z20822 Contact with and (suspected) exposure to covid-19: Secondary | ICD-10-CM | POA: Diagnosis not present

## 2021-01-23 ENCOUNTER — Other Ambulatory Visit: Payer: Self-pay | Admitting: Obstetrics and Gynecology

## 2021-01-23 DIAGNOSIS — Z1231 Encounter for screening mammogram for malignant neoplasm of breast: Secondary | ICD-10-CM

## 2021-03-05 NOTE — Telephone Encounter (Signed)
Prolia VOB initiated via [email or web address]  Last OV: 08/07/20 Next OV: 08/13/21 Last Prolia inj: 10/10/20 Next Prolia inj DUE: 04/12/21

## 2021-03-09 NOTE — Telephone Encounter (Signed)
Pt ready for scheduling on or after 04/12/21  Out-of-pocket cost due at time of visit: $0.00  Primary: Medicare Prolia co-insurance:20%  Admin fee co-insurance:   Secondary: BCBS Supplemental Prolia co-insurance: covers Medicare co-insurance Admin fee co-insurance: covers Medicare co-insurance  Deductible: Camera operator covers Medicare deductible of $233   Prior Auth: not required PA# Valid:     ** This summary of benefits is an estimation of the patient's out-of-pocket cost. Exact cost may very based on individual plan coverage.

## 2021-03-16 DIAGNOSIS — Z4689 Encounter for fitting and adjustment of other specified devices: Secondary | ICD-10-CM | POA: Diagnosis not present

## 2021-03-17 ENCOUNTER — Ambulatory Visit
Admission: RE | Admit: 2021-03-17 | Discharge: 2021-03-17 | Disposition: A | Discharge status: Home / Self Care | Payer: Medicare Other | Source: Ambulatory Visit | Attending: Obstetrics and Gynecology | Admitting: Obstetrics and Gynecology

## 2021-03-17 ENCOUNTER — Other Ambulatory Visit: Payer: Self-pay

## 2021-03-17 DIAGNOSIS — Z1231 Encounter for screening mammogram for malignant neoplasm of breast: Secondary | ICD-10-CM | POA: Diagnosis not present

## 2021-03-26 ENCOUNTER — Other Ambulatory Visit: Payer: Self-pay

## 2021-03-26 ENCOUNTER — Encounter: Payer: Self-pay | Admitting: Podiatry

## 2021-03-26 ENCOUNTER — Ambulatory Visit (INDEPENDENT_AMBULATORY_CARE_PROVIDER_SITE_OTHER): Payer: Medicare Other | Admitting: Podiatry

## 2021-03-26 DIAGNOSIS — M79676 Pain in unspecified toe(s): Secondary | ICD-10-CM | POA: Diagnosis not present

## 2021-03-26 DIAGNOSIS — M722 Plantar fascial fibromatosis: Secondary | ICD-10-CM

## 2021-03-26 DIAGNOSIS — B351 Tinea unguium: Secondary | ICD-10-CM

## 2021-03-29 NOTE — Progress Notes (Signed)
She presents today chief complaint of painful elongated toenails.  States that she still having some pain for the fascia also wanted to know if there was anything else we can do for the fasciitis.  Objective: Vital signs are stable alert oriented x3 toenails are long thick yellow dystrophic with mycotic.  She has mild tenderness on palpation medial calcaneal tubercle.  Assessment: Slowly resolving plan fasciitis.  Debrided toenails 1 through 5 bilateral.

## 2021-04-14 ENCOUNTER — Ambulatory Visit (INDEPENDENT_AMBULATORY_CARE_PROVIDER_SITE_OTHER): Payer: Medicare Other

## 2021-04-14 ENCOUNTER — Other Ambulatory Visit: Payer: Self-pay

## 2021-04-14 DIAGNOSIS — M810 Age-related osteoporosis without current pathological fracture: Secondary | ICD-10-CM

## 2021-04-14 MED ORDER — DENOSUMAB 60 MG/ML ~~LOC~~ SOSY
60.0000 mg | PREFILLED_SYRINGE | Freq: Once | SUBCUTANEOUS | Status: AC
  Administered 2021-04-14: 60 mg via SUBCUTANEOUS

## 2021-04-14 NOTE — Progress Notes (Signed)
Administered Prolia injection in to left arm. Pt tolerated well.

## 2021-04-21 NOTE — Telephone Encounter (Signed)
Pt received Prolia inj 04/14/21 Next Prolia inj due 10/14/21

## 2021-04-23 DIAGNOSIS — Z23 Encounter for immunization: Secondary | ICD-10-CM | POA: Diagnosis not present

## 2021-04-27 NOTE — Progress Notes (Signed)
Phone [phone] In person visit   Subjective:   Abigail Cohen is a 80 y.o. year old very pleasant female patient who presents for/with See problem oriented charting Chief Complaint  Patient presents with   Follow-up    This visit occurred during the SARS-CoV-2 public health emergency.  Safety protocols were in place, including screening questions prior to the visit, additional usage of staff PPE, and extensive cleaning of exam room while observing appropriate contact time as indicated for disinfecting solutions.   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     Alpha Lipoic Acid 200 MG CAPS See admin instructions.     amoxicillin (AMOXIL) 500 MG capsule TAKE FOUR CAPSULES BY MOUTH 1 HOUR BEFORE DENTAL PROCEDURE     aspirin EC 81 MG tablet Take 1 tablet (81 mg total) by mouth daily.     BENFOTIAMINE PO Take 300 mg by mouth daily.     CALCIUM CITRATE PO Take 1-2 tablets by mouth at bedtime.     chlorhexidine (PERIDEX) 0.12 % solution 15 mLs 2 (two) times daily.     Cholecalciferol (VITAMIN D3) 10 MCG (400 UNIT) tablet Take 400 Units by mouth daily.     denosumab (PROLIA) 60 MG/ML SOSY injection Inject 60 mg into the skin every 6 (six) months.     estradiol (ESTRACE) 0.1 MG/GM vaginal cream Place 1 Applicatorful vaginally 2 (two)  times a week.      ibuprofen (ADVIL) 200 MG tablet 2 tabs     Magnesium 250 MG TABS Take 250 mg by mouth daily.      metoprolol succinate (TOPROL-XL) 25 MG 24 hr tablet Take 1 tablet (25 mg total) by mouth daily. 90 tablet 3   Multiple Vitamin (MULTIVITAMIN WITH MINERALS) TABS tablet Take 1 tablet by mouth daily.     polyethylene glycol (MIRALAX / GLYCOLAX) 17 g packet 1/2 packet mixed with 8 ounces of fluid     rosuvastatin (CRESTOR) 10 MG tablet TAKE 1 TABLET(10 MG) BY MOUTH DAILY 90 tablet 3   No current facility-administered medications for this visit.     Objective:  BP 138/82   Pulse 99   Temp 98.2 F (36.8 C)   Ht 4' 9.99" (1.473 m)   Wt 130 lb (59 kg)   LMP  (LMP Unknown)   SpO2 96%   BMI 27.18 kg/m  Gen: NAD, resting comfortably Impacted cerumen right ear- too hard to remove with curette CV: RRR no murmurs rubs or gallops Lungs: CTAB no crackles, wheeze, rhonchi Ext: no edema Skin: warm, dry    Assessment and Plan   # Forehead bump S:noted a bony growth under right eyebrow for a few months- cannot tell if changing size  A/P: unclear what this is- would like sinus CT as directly below  frontal sinuses  - considered ent consult  #Abdominal aortic aneurysm without rupture with history of open repair-followed with Dr. Audie Box of cardiology as well as vascular surgery (patient was told could be released for prn follow up) S:Medication: Aspirin and statin recommended per cardiology. A/P: has been stable- last ct angio abdomen/pelvis was 07/02/2019- repair was 2020 with [place] 2021- no recurrent issues since that time- only seeing cardiology now   #Coronary artery disease based on coronary artery calcification seen on CT chest with reassuring stress test and echocardiogram-followed with Dr. Audie Box of cardiology #hyperlipidemia- LDL goal under 70 S: Medication:Rosuvastatin 10 mg daily, asa 81 mg daily  No chest pain or shortness of breath with exertion (sometimes with anxiety  will get) Lab Results  Component Value Date   CHOL 145 09/22/2020   HDL 75 (A) 09/22/2020   [place] 63 09/22/2020   TRIG 51 09/22/2020   A/P: nonobstructive CAD- continue current meds and cardiology follow-up.  For hyperlipidemia LDL has been at goal under 70-continue current medication.  Reasonable to continue aspirin 81 mg per cardiology.  Metoprolol 25 mg also likely helpful for reducing risk of significant heart issues  #hypertension S: medication: Metoprolol succinate 25 mg twice daily BP Readings from Last 3 Encounters:  05/04/21 138/82  11/26/20 131/83  10/30/20 132/84  A/P: Controlled. Continue current medications.   # Osteoporosis- with Dr. Cruzita Lederer S: Last DEXA: 06/23/2020   Medication (bisphosphonate or prolia): Prolia   Calcium: 1200mg  (through diet ok) recommended  Vitamin D: 1000 units a day recommended Last vitamin D-greater than 50 in February 2022   A/P: osteoporosis stable- follows with Dr. Cruzita Lederer- continue current meds    #Osteoarthritis- followed with Dr. Trudie Reed of rheumatology. Bilateral knee replacements, hammer toes, bunions, back, scoliosis. No regular rx  -dental prophylaxis from dentist- started out with orthopedist -recently having issues with arthritis in the ankle and foot- considering ankle brace - considering orthotics- encouraged podiatry follow up  #ear congestion - cerumen impaction- wax is rather firm. Curette would not budge impaction. Will trial irrigation but if not successful may need ENT visit   Recommended follow up: Return in about 6 months (around 11/01/2021) for follow-up or sooner if needed. Future Appointments  Date Time Provider [place]  08/13/2021  1:00 PM Philemon Kingdom, MD [facility] None  09/22/2021  1:15 PM Hyatt, Romilda Garret, DPM [facility]    Lab/Order associations:   ICD-10-CM   1. Hyperlipidemia, unspecified hyperlipidemia type  E78.5     2. Essential hypertension  I10     3. Abdominal aortic  aneurysm (AAA) without rupture, unspecified part  I71.40     4. Osteoporosis without current pathological fracture, unspecified osteoporosis type  M81.0     5. Need for immunization against influenza  Z23     6. Coronary artery disease involving native coronary artery of native heart without angina pectoris  I25.10     7. Bony growth  M89.8X9 CT Maxillofacial WO CM     No orders of the defined types were placed in this encounter.  I,Jada Bradford,acting as a scribe for Garret Reddish, MD.,have documented all relevant documentation on the behalf of Garret Reddish, MD,as directed by  Garret Reddish, MD while in the presence of Garret Reddish, MD.  I, Garret Reddish, MD, have reviewed all documentation for this visit. The documentation on 05/04/21 for the exam, diagnosis, procedures, and orders are all accurate and complete.  Return precautions advised.  Garret Reddish, MD

## 2021-04-29 DIAGNOSIS — M150 Primary generalized (osteo)arthritis: Secondary | ICD-10-CM | POA: Diagnosis not present

## 2021-04-29 DIAGNOSIS — M2550 Pain in unspecified joint: Secondary | ICD-10-CM | POA: Diagnosis not present

## 2021-04-29 DIAGNOSIS — R768 Other specified abnormal immunological findings in serum: Secondary | ICD-10-CM | POA: Diagnosis not present

## 2021-04-29 DIAGNOSIS — M545 Low back pain, unspecified: Secondary | ICD-10-CM | POA: Diagnosis not present

## 2021-04-29 DIAGNOSIS — Z6826 Body mass index (BMI) 26.0-26.9, adult: Secondary | ICD-10-CM | POA: Diagnosis not present

## 2021-04-29 DIAGNOSIS — E663 Overweight: Secondary | ICD-10-CM | POA: Diagnosis not present

## 2021-05-04 ENCOUNTER — Encounter: Payer: Self-pay | Admitting: Family Medicine

## 2021-05-04 ENCOUNTER — Other Ambulatory Visit: Payer: Self-pay

## 2021-05-04 ENCOUNTER — Ambulatory Visit (INDEPENDENT_AMBULATORY_CARE_PROVIDER_SITE_OTHER): Payer: Medicare Other | Admitting: Family Medicine

## 2021-05-04 VITALS — BP 138/82 | HR 99 | Temp 98.2°F | Ht <= 58 in | Wt 130.0 lb

## 2021-05-04 DIAGNOSIS — I2510 Atherosclerotic heart disease of native coronary artery without angina pectoris: Secondary | ICD-10-CM | POA: Diagnosis not present

## 2021-05-04 DIAGNOSIS — M810 Age-related osteoporosis without current pathological fracture: Secondary | ICD-10-CM | POA: Diagnosis not present

## 2021-05-04 DIAGNOSIS — E785 Hyperlipidemia, unspecified: Secondary | ICD-10-CM

## 2021-05-04 DIAGNOSIS — I10 Essential (primary) hypertension: Secondary | ICD-10-CM

## 2021-05-04 DIAGNOSIS — I714 Abdominal aortic aneurysm, without rupture, unspecified: Secondary | ICD-10-CM

## 2021-05-04 DIAGNOSIS — Z23 Encounter for immunization: Secondary | ICD-10-CM

## 2021-05-04 DIAGNOSIS — M898X9 Other specified disorders of bone, unspecified site: Secondary | ICD-10-CM | POA: Diagnosis not present

## 2021-05-04 DIAGNOSIS — I713 Abdominal aortic aneurysm, ruptured, unspecified: Secondary | ICD-10-CM

## 2021-05-04 NOTE — Patient Instructions (Addendum)
Health Maintenance Due  Topic Date Due   Zoster Vaccines- Shingrix (1 of 2)- Please consider checking with your local pharmacy to see if they have the shingrix vaccine. If they do- please get this immunization and update [place] by phone call or mychart with dates you receive the vaccine Never done   TETANUS/TDAP - recommend getting your tdap at local pharmacy.  02/02/2021   Team please give a ear irrigation.  We will call you within two weeks about your referral for ct scan of sinuses/right forehead bump. If you do not hear within 2 weeks, give [place] a call.   Recommended follow up: Return in about 6 months (around 11/01/2021) for follow-up or sooner if needed.

## 2021-05-05 ENCOUNTER — Ambulatory Visit
Admission: RE | Admit: 2021-05-05 | Discharge: 2021-05-05 | Disposition: A | Discharge status: Home / Self Care | Payer: Medicare Other | Source: Ambulatory Visit | Attending: Family Medicine | Admitting: Family Medicine

## 2021-05-05 DIAGNOSIS — D164 Benign neoplasm of bones of skull and face: Secondary | ICD-10-CM | POA: Diagnosis not present

## 2021-05-05 DIAGNOSIS — R220 Localized swelling, mass and lump, head: Secondary | ICD-10-CM | POA: Diagnosis not present

## 2021-05-05 DIAGNOSIS — M898X9 Other specified disorders of bone, unspecified site: Secondary | ICD-10-CM

## 2021-05-20 ENCOUNTER — Other Ambulatory Visit: Payer: Medicare Other

## 2021-06-05 ENCOUNTER — Other Ambulatory Visit: Payer: Self-pay

## 2021-06-05 ENCOUNTER — Ambulatory Visit (INDEPENDENT_AMBULATORY_CARE_PROVIDER_SITE_OTHER): Payer: Medicare Other | Admitting: Physician Assistant

## 2021-06-05 VITALS — BP 137/82 | HR 76 | Temp 97.1°F | Ht <= 58 in | Wt 131.2 lb

## 2021-06-05 DIAGNOSIS — R2681 Unsteadiness on feet: Secondary | ICD-10-CM | POA: Diagnosis not present

## 2021-06-05 DIAGNOSIS — G609 Hereditary and idiopathic neuropathy, unspecified: Secondary | ICD-10-CM

## 2021-06-05 DIAGNOSIS — E538 Deficiency of other specified B group vitamins: Secondary | ICD-10-CM | POA: Diagnosis not present

## 2021-06-05 DIAGNOSIS — M159 Polyosteoarthritis, unspecified: Secondary | ICD-10-CM

## 2021-06-05 DIAGNOSIS — M5431 Sciatica, right side: Secondary | ICD-10-CM

## 2021-06-05 LAB — VITAMIN B12: Vitamin B-12: 662 pg/mL (ref 211–911)

## 2021-06-05 MED ORDER — METHYLPREDNISOLONE 4 MG PO TBPK: ORAL_TABLET | ORAL | 0 refills | Status: DC

## 2021-06-05 NOTE — Progress Notes (Signed)
Subjective:    Patient ID: Abigail Cohen, female    DOB: 09/09/1940, 80 y.o.   MRN: [id number]  Chief Complaint  Patient presents with   Leg Pain    Right leg    HPI Patient is in today for possible sciatica.  Patient states that she started to develop intermittent right leg pain and sharp shooting sensation starting from her right lower back going into her buttock and down the back of her leg into her right foot over the last week.  She states that she has been taking Advil as needed and this is helping.  She denies any known injury.  She does have a history of scoliosis and severe arthritis.  She states that she was experiencing Planter fasciitis in the left foot prior to her right sciatic pain, but this is better now.  She walks with a cane.  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  She feels unsteady when she walks and she is worried about falling.  She denies any fever or chills.  No incontinence of urine or stool.  No foot drop.  No saddle anesthesia.  No other neurologic concerns.  Past Medical History:  Diagnosis Date   AAA (abdominal aortic aneurysm)    Allergy    Essential hypertension h   Family history of anesthesia complication    daughter gets nauseated   Hearing loss    wears hearing aids   Hypertension    Knee joint replacement status    Osteoarthritis    Osteopenia    Osteoporosis    Presence of intracervical pessary    Uterine prolapse    Wears glasses    Wears partial dentures    upper and lower partial    Past Surgical History:  Procedure Laterality Date   ABDOMINAL AORTIC ANEURYSM REPAIR N/A 08/28/2019   Procedure: ANEURYSM ABDOMINAL AORTIC REPAIR OPEN;  Surgeon: Angelia Mould, MD;  Location: [facility] OR;  Service: Vascular;  Laterality: N/A;   COLONOSCOPY     EYE SURGERY     cataracts removal   HERNIA REPAIR  07/07/12   Dr Aretha Parrot HERNIA REPAIR  07/07/2012   Procedure: HERNIA REPAIR INGUINAL ADULT;  Surgeon: Haywood Lasso, MD;   Location: [facility];  Service: General;  Laterality: Left;  left inguinal area   REPLACEMENT TOTAL KNEE BILATERAL  2003   TONSILLECTOMY  1945   WISDOM TOOTH EXTRACTION      Family History  Problem Relation Age of Onset   Heart disease Mother    Heart attack Mother        59s   Arrhythmia Mother    Heart disease Father        38s   Heart attack Father    Cancer Sister        endometrial    Social History   Tobacco Use   Smoking status: Former    Years: 35.00    Types: Cigarettes    Start date: 07/05/2004    Quit date: 06/30/2005    Years since quitting: 15.9   Smokeless tobacco: Never  Vaping Use   Vaping Use: Never used  Substance Use Topics   Alcohol use: Yes    Alcohol/week: 7.0 standard drinks    Types: 7 Glasses of wine per week   Drug use: No     Allergies  Allergen Reactions   Penicillins     Blood shot eyes, felt poorly.  Tolerates amoxicillin  with dentist    Review of Systems NEGATIVE UNLESS OTHERWISE INDICATED IN HPI      Objective:     BP 137/82   Pulse 76   Temp (!) 97.1 F (36.2 C)   Ht 4' 9.99" (1.473 m)   Wt 131 lb 3.2 oz (59.5 kg)   LMP  (LMP Unknown)   SpO2 97%   BMI 27.43 kg/m   Wt Readings from Last 3 Encounters:  06/05/21 131 lb 3.2 oz (59.5 kg)  05/04/21 130 lb (59 kg)  11/26/20 126 lb (57.2 kg)    BP Readings from Last 3 Encounters:  06/05/21 137/82  05/04/21 138/82  11/26/20 131/83     Physical Exam Vitals and nursing note reviewed.  Constitutional:      Appearance: Normal appearance.     Comments: Using a cane  Musculoskeletal:     Thoracic back: Scoliosis present.     Lumbar back: No spasms or tenderness. Normal range of motion. Positive right straight leg raise test. Negative left straight leg raise test. Scoliosis present.     Comments: She can push and pull against me in her lower extremities without difficulty.  No foot drop noted.  Neurological:     General: No focal deficit present.      Mental Status: She is alert and oriented to person, place, and time.     Motor: No weakness.     Gait: Gait normal.       Assessment & Plan:   Problem List Items Addressed This Visit       Musculoskeletal and Integument   Osteoarthritis   Relevant Medications   methylPREDNISolone (MEDROL DOSEPAK) 4 MG TBPK tablet   Other Relevant Orders   Ambulatory referral to Physical Therapy   Other Visit Diagnoses     Right sided sciatica    -  Primary   Relevant Orders   Ambulatory referral to Physical Therapy   Idiopathic neuropathy       Relevant Orders   Vitamin B12   Ambulatory referral to Physical Therapy   Unsteady gait       Relevant Orders   Ambulatory referral to Physical Therapy   Vitamin B12 deficiency       Relevant Orders   Vitamin B12        Meds ordered this encounter  Medications   methylPREDNISolone (MEDROL DOSEPAK) 4 MG TBPK tablet    Sig: Please take per packaging instructions.    Dispense:  21 tablet    Refill:  0   Plan: -No red flags on exam.  Plan to treat probable sciatica with Medrol Dosepak.  Rest, gentle stretches.  Advised Tylenol with the steroid pack as needed.  She should not take NSAIDs until her steroid course is completed. -Also plan to get her started with PT at our office. -Last B12 has not been checked since 2017 and suggested repeating this level today in case of possible underlying cause for her neuropathy.  This note was prepared with assistance of Systems analyst. Occasional wrong-word or sound-a-like substitutions may have occurred due to the inherent limitations of voice recognition software.  Time Spent: 38 minutes of total time was spent on the date of the encounter performing the following actions: chart review prior to seeing the patient, obtaining history, performing a medically necessary exam, counseling on the treatment plan, placing orders, and documenting in our EHR.    Teala Daffron M Jerritt Cardoza, PA-C

## 2021-06-05 NOTE — Patient Instructions (Signed)
Good to meet you today! Please go to the lab for blood work and I will call with results.  Someone will call with appointments with Lauren for PT.  Take medrol dose pak as directed. Do not take aleve (NSAIDs) until course of steroids completed. Gentle stretches OK. Tylenol OK.

## 2021-06-10 ENCOUNTER — Other Ambulatory Visit: Payer: Self-pay

## 2021-06-10 ENCOUNTER — Ambulatory Visit (INDEPENDENT_AMBULATORY_CARE_PROVIDER_SITE_OTHER): Payer: Medicare Other | Admitting: Physical Therapy

## 2021-06-10 DIAGNOSIS — M25572 Pain in left ankle and joints of left foot: Secondary | ICD-10-CM | POA: Diagnosis not present

## 2021-06-10 DIAGNOSIS — M5416 Radiculopathy, lumbar region: Secondary | ICD-10-CM

## 2021-06-10 DIAGNOSIS — R293 Abnormal posture: Secondary | ICD-10-CM

## 2021-06-10 DIAGNOSIS — R2689 Other abnormalities of gait and mobility: Secondary | ICD-10-CM | POA: Diagnosis not present

## 2021-06-10 NOTE — Patient Instructions (Signed)
Access Code: [email or web address] URL: [email or web address] Date: 06/10/2021 Prepared by: Lyndee Hensen  Exercises Single Knee to Chest Stretch - 2 x daily - 3 reps - 30 hold Supine Piriformis Stretch Pulling Heel to Hip - 2 x daily - 3 reps - 30 hold Seated Hamstring Stretch - 2 x daily - 3 reps - 30 hold Side to Side Weight Shift with Counter Support - 1 x daily - 2 sets - 10 reps

## 2021-06-23 ENCOUNTER — Ambulatory Visit (INDEPENDENT_AMBULATORY_CARE_PROVIDER_SITE_OTHER): Payer: Medicare Other | Admitting: Physical Therapy

## 2021-06-23 ENCOUNTER — Encounter (INDEPENDENT_AMBULATORY_CARE_PROVIDER_SITE_OTHER): Payer: Self-pay

## 2021-06-23 DIAGNOSIS — M5416 Radiculopathy, lumbar region: Secondary | ICD-10-CM | POA: Diagnosis not present

## 2021-06-23 DIAGNOSIS — R2689 Other abnormalities of gait and mobility: Secondary | ICD-10-CM

## 2021-06-23 DIAGNOSIS — M25572 Pain in left ankle and joints of left foot: Secondary | ICD-10-CM

## 2021-06-23 DIAGNOSIS — R293 Abnormal posture: Secondary | ICD-10-CM | POA: Diagnosis not present

## 2021-06-29 ENCOUNTER — Other Ambulatory Visit: Payer: Self-pay | Admitting: Cardiovascular Disease

## 2021-06-30 ENCOUNTER — Telehealth: Payer: Self-pay | Admitting: Cardiovascular Disease

## 2021-06-30 NOTE — Telephone Encounter (Signed)
Spoke to pt. She report recently she' been feeling more SOB with exertion. She denies any swelling, significant weight increase, or any other symptoms. Pt state she would just like to be evaluated.  Appointment scheduled for 1/9 with Dr. Marisue Ivan. Pt also made aware of ED precaution should any new symptoms develop or worsen.

## 2021-06-30 NOTE — Telephone Encounter (Signed)
Pt c/o Shortness Of Breath: STAT if SOB developed within the last 24 hours or pt is noticeably SOB on the phone  1. Are you currently SOB (can you hear that pt is SOB on the phone)? Yes   2. How long have you been experiencing SOB? This morning  3. Are you SOB when sitting or when up moving around? Moving around  4. Are you currently experiencing any other symptoms? No

## 2021-07-01 ENCOUNTER — Encounter: Payer: Self-pay | Admitting: Physical Therapy

## 2021-07-01 NOTE — Therapy (Signed)
OUTPATIENT PHYSICAL THERAPY TREATMENT NOTE    Patient Name: Abigail Cohen MRN: [id number] DOB:02/16/1941, 80 y.o., female Today's Date: 06/23/21   PT End of Session - 07/01/21 1413     Visit Number 2    Number of Visits 16    Date for PT Re-Evaluation 08/05/21    Authorization Type Medicare    PT Start Time 0930    PT Stop Time 1016    PT Time Calculation (min) 46 min    Equipment Utilized During Treatment Gait belt    Activity Tolerance Patient tolerated treatment well    Behavior During Therapy WFL for tasks assessed/performed             Past Medical History:  Diagnosis Date   AAA (abdominal aortic aneurysm)    Allergy    Essential hypertension h   Family history of anesthesia complication    daughter gets nauseated   Hearing loss    wears hearing aids   Hypertension    Knee joint replacement status    Osteoarthritis    Osteopenia    Osteoporosis    Presence of intracervical pessary    Uterine prolapse    Wears glasses    Wears partial dentures    upper and lower partial   Past Surgical History:  Procedure Laterality Date   ABDOMINAL AORTIC ANEURYSM REPAIR N/A 08/28/2019   Procedure: ANEURYSM ABDOMINAL AORTIC REPAIR OPEN;  Surgeon: Angelia Mould, MD;  Location: [facility] OR;  Service: Vascular;  Laterality: N/A;   COLONOSCOPY     EYE SURGERY     cataracts removal   HERNIA REPAIR  07/07/12   Dr Aretha Parrot HERNIA REPAIR  07/07/2012   Procedure: HERNIA REPAIR INGUINAL ADULT;  Surgeon: Haywood Lasso, MD;  Location: [place];  Service: General;  Laterality: Left;  left inguinal area   REPLACEMENT TOTAL KNEE BILATERAL  2003   [place] EXTRACTION     Patient Active Problem List   Diagnosis Date Noted   nonobstructive CAD- follows with Dr. Audie Box cardiology  05/04/2021   Hyperlipidemia 10/30/2020   Essential hypertension 10/30/2020   Allergic rhinitis 10/30/2020   Osteoarthritis 10/30/2020   Abdominal  aortic aneurysm (AAA) 08/28/2019   Osteoporosis 08/02/2016   Left inguinal hernia 06/20/2012    PCP: Marin Olp, MD  REFERRING PROVIDER: Marin Olp, MD  REFERRING DIAG: R sciatica, Gait abnormality.  THERAPY DIAG:  Radiculopathy, lumbar region  Other abnormalities of gait and mobility  Abnormal posture  Pain in left ankle and joints of left foot   SUBJECTIVE:  SUBJECTIVE STATEMENT:  Pt states she has ended the prednesone, feeling a bit better. Her daughter is present with her today, pt would like her to see/review HEP.   PERTINENT HISTORY:  Bil TKA, Osteoporosis, Significant scoliosis, AAA, Neuropathy,   PAIN:  Are you having pain? Yes VAS scale: 2/10 Pain location: Leg  Pain orientation: Left  PAIN TYPE: burning and radiating  Pain description: intermittent  Aggravating factors: increased activity, transfers, standing, walking  Relieving factors: rest, heat    PATIENT GOALS  decreased pain    OBJECTIVE:   06/10/21:  LUMBARAROM/PROM Lumbar ROM: mod/significant limitation for extension and R SB, Flexion, mild limitation.   Hips: mild limitation for ER and Ext Knees: WFL   LE MMT:  MMT Right 06/10/21 Left 06/10/21  Hip flexion 4- 4-  Hip extension    Hip abduction 3 4-  Hip adduction    Hip internal rotation    Hip external rotation 4- 4-  Knee flexion 4+ 4+  Knee extension 4 4                   (Blank rows = not tested)  LUMBAR SPECIAL TESTS:  Neg SLR    GAIT: posture abnormalities from scoliosis, increased lateral trunk sway, decreased stability in hips and feet,  poor heel to toe propulsion in feet, poor push off.    TODAY'S TREATMENT   06/23/21:  Therapeutic Exercise: Aerobic: Supine: Seated: Standing: March x 20,  S/L : hip abd   Stretches:  Single Knee to Chest Stretch - 2 x daily - 3 reps - 30 hold Supine Piriformis Stretch Pulling Heel to Hip - 2 x daily - 3 reps - 30 hold Seated Hamstring Stretch - 2 x daily - 3 reps - 30 hold  Neuromuscular Re-education: Standing weight shifts L/R and staggered stance, x 20 each, bil;  Manual Therapy: Therapeutic Activity: Sit to stand x10  Self Care: Education for pt and daughter on HEP, home safety, and Pt presentation, posture, and deficits.       PATIENT EDUCATION:  Education details: PT POC, Exam findings, HEP, posture, implications for Plantar fasciitis.  Person educated: Patient Education method: Explanation, Demonstration, Tactile cues, Verbal cues, and Handouts Education comprehension: verbalized understanding, returned demonstration, verbal cues required, tactile cues required, and needs further education   HOME EXERCISE PROGRAM: Access Code: [email or web address] URL: [email or web address] Date: 06/10/2021 Prepared by: Lyndee Hensen   Exercises Single Knee to Chest Stretch - 2 x daily - 3 reps - 30 hold Supine Piriformis Stretch Pulling Heel to Hip - 2 x daily - 3 reps - 30 hold Seated Hamstring Stretch - 2 x daily - 3 reps - 30 hold Side to Side Weight Shift with Counter Support - 1 x daily - 2 sets - 10 reps  ASSESSMENT:  CLINICAL IMPRESSION: Objective impairments include Abnormal gait, decreased activity tolerance, decreased balance, decreased coordination, decreased knowledge of use of DME, decreased mobility, difficulty walking, decreased ROM, decreased strength, hypomobility, increased muscle spasms, impaired flexibility, improper body mechanics, postural dysfunction, and pain. These impairments are limiting patient from cleaning, community activity, shopping, and yard work. Personal factors including 1 comorbidity: osteoporosis and 1-2 comorbidities: scoliosis, pain at multiple sites,   also affecting patient's functional outcome. Patient will  benefit from skilled PT to address above impairments and improve overall function.  Pt with some improvement in pain today, good ability for there ex without pain. She has concern for doing the exercises correctly, discussed optimal form with her  and daughter today, as well as gave new handouts. Poor standing stability seen with standing/balance today, will benefit from progression of weight shifts and stability, as able, to improve gait mechanics.    REHAB POTENTIAL: Good  CLINICAL DECISION MAKING: Evolving/moderate complexity  EVALUATION COMPLEXITY: Moderate   GOALS: Goals reviewed with patient? Yes  SHORT TERM GOALS: SHORT TERM GOALS:  STG Name Target Date Goal status  1 Patient will be independent with initial HEP for back and foot pain  06/24/21 INITIAL                   LONG TERM GOALS:   LTG Name Target Date Goal status  1 Pt to be independent with final HEP.   08/05/21 INITIAL  2 Pt to report decreased pain in low back and LE to 0-3/10 with standing and activity.   08/05/21 INITIAL  3 Pt to report decreased pain in R foot to 0-2/10 with standing and walking   08/05/21 INITIAL  4 Pt to demo improved ambulation pattern, with decreased lateral trunk sway,and improved balance, to be [facility], for improved efficiency.  08/05/21 INITIAL   5 Balance goal TBD.  08/05/21 INITIAL   6  Baseline:    7  Baseline:        PLAN: PT FREQUENCY: 1-2x/week  PT DURATION: 8 weeks  PLANNED INTERVENTIONS: Therapeutic exercises, Therapeutic activity, Neuro Muscular re-education, Balance training, Gait training, Patient/Family education, Joint mobilization, Stair training, Orthotic/Fit training, DME instructions, Dry Needling, Electrical stimulation, Spinal mobilization, Cryotherapy, Moist heat, Taping, Traction, Ultrasound, Ionotophoresis 4mg /ml Dexamethasone, and Manual therapy   Lyndee Hensen, PT, DPT 2:54 PM  07/01/21

## 2021-07-01 NOTE — Therapy (Signed)
OUTPATIENT PHYSICAL THERAPY THORACOLUMBAR EVALUATION   Patient Name: Abigail Cohen MRN: [id number] DOB:January 28, 1941, 80 y.o., female Today's Date: 07/01/2021   PT End of Session - 07/01/21 1033     Visit Number 1    Number of Visits 16    Date for PT Re-Evaluation 08/05/21    Authorization Type Medicare    PT Start Time 1100    PT Stop Time 1145    PT Time Calculation (min) 45 min    Equipment Utilized During Treatment Gait belt    Activity Tolerance Patient tolerated treatment well    Behavior During Therapy WFL for tasks assessed/performed             Past Medical History:  Diagnosis Date   AAA (abdominal aortic aneurysm)    Allergy    Essential hypertension h   Family history of anesthesia complication    daughter gets nauseated   Hearing loss    wears hearing aids   Hypertension    Knee joint replacement status    Osteoarthritis    Osteopenia    Osteoporosis    Presence of intracervical pessary    Uterine prolapse    Wears glasses    Wears partial dentures    upper and lower partial   Past Surgical History:  Procedure Laterality Date   ABDOMINAL AORTIC ANEURYSM REPAIR N/A 08/28/2019   Procedure: ANEURYSM ABDOMINAL AORTIC REPAIR OPEN;  Surgeon: Angelia Mould, MD;  Location: [facility] OR;  Service: Vascular;  Laterality: N/A;   COLONOSCOPY     EYE SURGERY     cataracts removal   HERNIA REPAIR  07/07/12   Dr Aretha Parrot HERNIA REPAIR  07/07/2012   Procedure: HERNIA REPAIR INGUINAL ADULT;  Surgeon: Haywood Lasso, MD;  Location: [place];  Service: General;  Laterality: Left;  left inguinal area   REPLACEMENT TOTAL KNEE BILATERAL  2003   [place] EXTRACTION     Patient Active Problem List   Diagnosis Date Noted   nonobstructive CAD- follows with Dr. Audie Box cardiology  05/04/2021   Hyperlipidemia 10/30/2020   Essential hypertension 10/30/2020   Allergic rhinitis 10/30/2020   Osteoarthritis 10/30/2020    Abdominal aortic aneurysm (AAA) 08/28/2019   Osteoporosis 08/02/2016   Left inguinal hernia 06/20/2012    PCP: Marin Olp, MD  REFERRING PROVIDER: Allwardt, Randa Evens, PA-C  REFERRING DIAG: R sciatica, Gait abnormality.  THERAPY DIAG:  Radiculopathy, lumbar region  Other abnormalities of gait and mobility  Abnormal posture  Pain in left ankle and joints of left foot   SUBJECTIVE:  SUBJECTIVE STATEMENT:  Pt with newer onset pain in R LE, into lateral lower leg. Notes increased pain for about 2 weeks, but has had some decreased pain since starting predisone. Has had back pain previously, but not sciatic pain. She states difficulty with walking, balance, and pain in L foot. She is wearing birkenstock sandals today, has SPC. She has stairs at home, but bedroom on 1st floor. No falls. She has a standing/glider machine that she uses at home for exercise.    PERTINENT HISTORY:  Bil TKA, Osteoporosis, Significant scoliosis, AAA, Neuropathy,   PAIN:  Are you having pain? Yes VAS scale: 2/10 Pain location: Leg  Pain orientation: Left  PAIN TYPE: burning and radiating  Pain description: intermittent  Aggravating factors: increased activity, transfers, standing, walking  Relieving factors: rest, heat ,   PRECAUTIONS: None  WEIGHT BEARING RESTRICTIONS No  FALLS:  Has patient fallen in last 6 months? No, Number of falls: 0  LIVING ENVIRONMENT: Lives with: lives with their family and lives alone Lives in: House/apartment  Has following equipment at home:     PLOF: Independent  PATIENT GOALS  decreased pain    OBJECTIVE:    SCREENING FOR RED FLAGS: Bowel or bladder incontinence: No Cauda equina syndrome: No Compression fracture: No Abdominal aneurysm: yes, see history    COGNITION:  Overall cognitive status: Within functional limits for tasks assessed      POSTURE:  R hip Higher in standing, significant scoliosis, shifted to L.  PALPATION:  Pain in L pl fascia, Pain in R lumbar region with palpation, into R hip and glute     LUMBARAROM/PROM Lumbar ROM: mod/significant limitation for extension and R SB, Flexion, mild limitation.   Hips: mild limitation for ER and Ext Knees: WFL   LE MMT:  MMT Right 06/10/21 Left 06/10/21  Hip flexion 4- 4-  Hip extension    Hip abduction 3 4-  Hip adduction    Hip internal rotation    Hip external rotation 4- 4-  Knee flexion 4+ 4+  Knee extension 4 4                   (Blank rows = not tested)  LUMBAR SPECIAL TESTS:  Neg SLR    GAIT: posture abnormalities from scoliosis, increased lateral trunk sway, decreased stability in hips and feet,  poor heel to toe propulsion in feet, poor push off.    TODAY'S TREATMENT  See below for HEP   PATIENT EDUCATION:  Education details: PT POC, Exam findings, HEP, posture, implications for Plantar fasciitis.  Person educated: Patient Education method: Explanation, Demonstration, Tactile cues, Verbal cues, and Handouts Education comprehension: verbalized understanding, returned demonstration, verbal cues required, tactile cues required, and needs further education   HOME EXERCISE PROGRAM: Access Code: [email or web address] URL: [email or web address] Date: 06/10/2021 Prepared by: Lyndee Hensen   Exercises Single Knee to Chest Stretch - 2 x daily - 3 reps - 30 hold Supine Piriformis Stretch Pulling Heel to Hip - 2 x daily - 3 reps - 30 hold Seated Hamstring Stretch - 2 x daily - 3 reps - 30 hold Side to Side Weight Shift with Counter Support - 1 x daily - 2 sets - 10 reps  ASSESSMENT:  CLINICAL IMPRESSION: Objective impairments include Abnormal gait, decreased activity tolerance, decreased balance, decreased coordination, decreased knowledge of  use of DME, decreased mobility, difficulty walking, decreased ROM, decreased strength, hypomobility, increased muscle spasms, impaired flexibility, improper body mechanics, postural dysfunction, and pain. These impairments  are limiting patient from cleaning, community activity, shopping, and yard work. Personal factors including 1 comorbidity: osteoporosis and 1-2 comorbidities: scoliosis, pain at multiple sites,   also affecting patient's functional outcome. Patient will benefit from skilled PT to address above impairments and improve overall function.  Pt presents with primary complaint of increased pain in back and R LE. She has significant scoliosis, with increased L shift, and hip asymmetry. Scoliosis likely causing compression and pain on R side. She has weakness in hips, R>L, and core weakness and instability. She has lack of effective HEP for her Diagnosis and posture. She has poor stability in hip, knee, ankle,  and decreased balance with gait. She has increased lateral trunk sway, and decreased gait efficiency.  She also has increased pain in R foot, consistent with plantar fasciitis, and will benefit from education on footwear and management of this. Pt with decreased ability for full functional activities , due to pain. She will benefit from skilled PT to improve deficits and pain.    REHAB POTENTIAL: Good  CLINICAL DECISION MAKING: Evolving/moderate complexity  EVALUATION COMPLEXITY: Moderate   GOALS: Goals reviewed with patient? Yes  SHORT TERM GOALS: SHORT TERM GOALS:  STG Name Target Date Goal status  1 Patient will be independent with initial HEP for back and foot pain  06/24/21 INITIAL                   LONG TERM GOALS:   LTG Name Target Date Goal status  1 Pt to be independent with final HEP.   08/05/21 INITIAL  2 Pt to report decreased pain in low back and LE to 0-3/10 with standing and activity.   08/05/21 INITIAL  3 Pt to report decreased pain in R foot to 0-2/10  with standing and walking   08/05/21 INITIAL  4 Pt to demo improved ambulation pattern, with decreased lateral trunk sway,and improved balance, to be [facility], for improved efficiency.  08/05/21 INITIAL   5 Balance goal TBD.  08/05/21 INITIAL   6  Baseline:    7  Baseline:        PLAN: PT FREQUENCY: 1-2x/week  PT DURATION: 8 weeks  PLANNED INTERVENTIONS: Therapeutic exercises, Therapeutic activity, Neuro Muscular re-education, Balance training, Gait training, Patient/Family education, Joint mobilization, Stair training, Orthotic/Fit training, DME instructions, Dry Needling, Electrical stimulation, Spinal mobilization, Cryotherapy, Moist heat, Taping, Traction, Ultrasound, Ionotophoresis 4mg /ml Dexamethasone, and Manual therapy    Lyndee Hensen, PT, DPT 1:54 PM  07/01/21

## 2021-07-02 ENCOUNTER — Encounter: Payer: Medicare Other | Admitting: Physical Therapy

## 2021-07-07 ENCOUNTER — Encounter: Payer: Medicare Other | Admitting: Physical Therapy

## 2021-07-09 ENCOUNTER — Other Ambulatory Visit: Payer: Self-pay

## 2021-07-09 ENCOUNTER — Encounter: Payer: Self-pay | Admitting: Physical Therapy

## 2021-07-09 ENCOUNTER — Encounter: Payer: Medicare Other | Admitting: Physical Therapy

## 2021-07-09 ENCOUNTER — Ambulatory Visit (INDEPENDENT_AMBULATORY_CARE_PROVIDER_SITE_OTHER): Payer: Medicare Other | Admitting: Physical Therapy

## 2021-07-09 DIAGNOSIS — M5416 Radiculopathy, lumbar region: Secondary | ICD-10-CM

## 2021-07-09 DIAGNOSIS — M25572 Pain in left ankle and joints of left foot: Secondary | ICD-10-CM | POA: Diagnosis not present

## 2021-07-09 DIAGNOSIS — R293 Abnormal posture: Secondary | ICD-10-CM

## 2021-07-09 DIAGNOSIS — R2689 Other abnormalities of gait and mobility: Secondary | ICD-10-CM | POA: Diagnosis not present

## 2021-07-09 NOTE — Therapy (Signed)
OUTPATIENT PHYSICAL THERAPY TREATMENT NOTE    Patient Name: Abigail Cohen MRN: [id number] DOB:Jun 21, 1941, 81 y.o., female Today's Date: 07/09/21   PT End of Session - 07/09/21 1426     Visit Number 3    Number of Visits 16    Date for PT Re-Evaluation 08/05/21    Authorization Type Medicare    PT Start Time 1432    PT Stop Time 1520    PT Time Calculation (min) 48 min    Equipment Utilized During Treatment Gait belt    Activity Tolerance Patient tolerated treatment well    Behavior During Therapy WFL for tasks assessed/performed             Past Medical History:  Diagnosis Date   AAA (abdominal aortic aneurysm)    Allergy    Essential hypertension h   Family history of anesthesia complication    daughter gets nauseated   Hearing loss    wears hearing aids   Hypertension    Knee joint replacement status    Osteoarthritis    Osteopenia    Osteoporosis    Presence of intracervical pessary    Uterine prolapse    Wears glasses    Wears partial dentures    upper and lower partial   Past Surgical History:  Procedure Laterality Date   ABDOMINAL AORTIC ANEURYSM REPAIR N/A 08/28/2019   Procedure: ANEURYSM ABDOMINAL AORTIC REPAIR OPEN;  Surgeon: Angelia Mould, MD;  Location: [facility] OR;  Service: Vascular;  Laterality: N/A;   COLONOSCOPY     EYE SURGERY     cataracts removal   HERNIA REPAIR  07/07/12   Dr Aretha Parrot HERNIA REPAIR  07/07/2012   Procedure: HERNIA REPAIR INGUINAL ADULT;  Surgeon: Haywood Lasso, MD;  Location: [place];  Service: General;  Laterality: Left;  left inguinal area   REPLACEMENT TOTAL KNEE BILATERAL  2003   [place] EXTRACTION     Patient Active Problem List   Diagnosis Date Noted   nonobstructive CAD- follows with Dr. Audie Box cardiology  05/04/2021   Hyperlipidemia 10/30/2020   Essential hypertension 10/30/2020   Allergic rhinitis 10/30/2020   Osteoarthritis 10/30/2020   Abdominal  aortic aneurysm (AAA) 08/28/2019   Osteoporosis 08/02/2016   Left inguinal hernia 06/20/2012    PCP: Marin Olp, MD  REFERRING PROVIDER: Marin Olp, MD  REFERRING DIAG: R sciatica, Gait abnormality.  THERAPY DIAG:  Radiculopathy, lumbar region  Other abnormalities of gait and mobility  Abnormal posture  Pain in left ankle and joints of left foot   SUBJECTIVE:  SUBJECTIVE STATEMENT:  Pt states much less pain in back and leg. R shoulder is very sore with reaching and elevation.   PERTINENT HISTORY:  Bil TKA, Osteoporosis, Significant scoliosis, AAA, Neuropathy,   PAIN:  Are you having pain? Yes, pain much better.  VAS scale: 1/10 Pain location: Leg  Pain orientation: Left  PAIN TYPE: burning and radiating  Pain description: intermittent  Aggravating factors: increased activity, transfers, standing, walking  Relieving factors: rest, heat    VAS scale: 5/10 Pain location: shoulder Pain orientation:Right  PAIN TYPE: aching   Pain description: intermittent  Aggravating factors: reaching, elevation  Relieving factors: rest, heat     PATIENT GOALS  decreased pain    OBJECTIVE:   06/10/21:  LUMBARAROM/PROM Lumbar ROM: mod/significant limitation for extension and R SB, Flexion, mild limitation.   Hips: mild limitation for ER and Ext Knees: WFL   LE MMT:  MMT Right 06/10/21 Left 06/10/21  Hip flexion 4- 4-  Hip extension    Hip abduction 3 4-  Hip adduction    Hip internal rotation    Hip external rotation 4- 4-  Knee flexion 4+ 4+  Knee extension 4 4                   (Blank rows = not tested)  LUMBAR SPECIAL TESTS:  Neg SLR    GAIT: posture abnormalities from scoliosis, increased lateral trunk sway, decreased stability in hips and feet,  poor heel  to toe propulsion in feet, poor push off.    TODAY'S TREATMENT  07/09/21:   Therapeutic Exercise: Aerobic: Supine: SLR x 10 bil with TA; shoulder flexion AAROM hands clasped, x 10;  Seated: sit to stand x 10 , scap squeezes x 10;  Standing: March x 20, hip abd 2x10 bil;  shoulder ER RTB x 15;  S/L: hip abd x15 bil;  Stretches:   Neuromuscular Re-education: Standing weight shifts staggered stance, x 20 each bil; fwd/bwd stepping with weight shifts x 25 ea bil;  Gait:  35 ft x 6 without SPC, cueing for improving heel strike on R to decrease midfoot stance; Manual Therapy: Therapeutic Activity: Sit to stand x10  Self Care: updated and reviewed HEP in detail.     PATIENT EDUCATION:  Education details: reviewed and updated HEP  Person educated: Patient Education method: Explanation, Demonstration, Tactile cues, Verbal cues, and Handouts Education comprehension: verbalized understanding, returned demonstration, verbal cues required, tactile cues required, and needs further education   HOME EXERCISE PROGRAM: Access Code: [email or web address]   ASSESSMENT:  CLINICAL IMPRESSION: 07/09/21:  Pt challenged with most exercises due to weakness and instability. Improvement noted with hip abd in s/l. Focus for anterior weight shifts and propulsion with pre gait and gait today, to decrease instability and lateral sway with gait. She does have increased pain in R shoulder, likely OA, discussed referral for shoulder pain to MD. Reviewed ther ex for R shoulder for HEP, and discussed not over doing activity. Pt to benefit from continued strengthening, balance and gait.   Objective impairments include Abnormal gait, decreased activity tolerance, decreased balance, decreased coordination, decreased knowledge of use of DME, decreased mobility, difficulty walking, decreased ROM, decreased strength, hypomobility, increased muscle spasms, impaired flexibility, improper body mechanics, postural dysfunction, and pain.  These impairments are limiting patient from cleaning, community activity, shopping, and yard work. Personal factors including 1 comorbidity: osteoporosis and 1-2 comorbidities: scoliosis, pain at multiple sites,   also affecting patient's functional outcome. Patient will benefit from skilled  PT to address above impairments and improve overall function.    REHAB POTENTIAL: Good  CLINICAL DECISION MAKING: Evolving/moderate complexity  EVALUATION COMPLEXITY: Moderate   GOALS: Goals reviewed with patient? Yes  SHORT TERM GOALS: SHORT TERM GOALS:  STG Name Target Date Goal status  1 Patient will be independent with initial HEP for back and foot pain  06/24/21 INITIAL                   LONG TERM GOALS:   LTG Name Target Date Goal status  1 Pt to be independent with final HEP.   08/05/21 INITIAL  2 Pt to report decreased pain in low back and LE to 0-3/10 with standing and activity.   08/05/21 INITIAL  3 Pt to report decreased pain in R foot to 0-2/10 with standing and walking   08/05/21 INITIAL  4 Pt to demo improved ambulation pattern, with decreased lateral trunk sway,and improved balance, to be [facility], for improved efficiency.  08/05/21 INITIAL   5 Balance goal TBD.  08/05/21 INITIAL   6  Baseline:    7  Baseline:        PLAN: PT FREQUENCY: 1-2x/week  PT DURATION: 8 weeks  PLANNED INTERVENTIONS: Therapeutic exercises, Therapeutic activity, Neuro Muscular re-education, Balance training, Gait training, Patient/Family education, Joint mobilization, Stair training, Orthotic/Fit training, DME instructions, Dry Needling, Electrical stimulation, Spinal mobilization, Cryotherapy, Moist heat, Taping, Traction, Ultrasound, Ionotophoresis 4mg /ml Dexamethasone, and Manual therapy   Lyndee Hensen, PT, DPT 3:27 PM  07/09/21

## 2021-07-10 DIAGNOSIS — Z4689 Encounter for fitting and adjustment of other specified devices: Secondary | ICD-10-CM | POA: Diagnosis not present

## 2021-07-10 NOTE — Progress Notes (Signed)
Cardiology Office Note:   Date:  07/13/2021  NAME:  Abigail Cohen    MRN: [id number] DOB:  14-Jun-1941   PCP:  Marin Olp, MD  Cardiologist:  Evalina Field, MD  Electrophysiologist:  None   Referring MD: Marin Olp, MD   Chief Complaint  Patient presents with   Follow-up        History of Present Illness:   Abigail Cohen is a 81 y.o. female with a hx of AAA status postrepair, hypertension, hyperlipidemia, coronary calcifications who presents for follow-up.  She reports she is doing well.  She presents with her son.  She reports she can get short of breath with stress and anxiety.  She reports stressful situations can do it.  She also noticed that possibly when standing up to do activities.  It does resolve.  Regardless she is able to complete 35 to 40 minutes on an exercise bike daily.  She reports no limitations with this.  She does get a little winded at the end of activity but this is normal.  She is completing 7000 steps daily.  Overall she is doing well.  Blood pressure is well controlled.  She denies any chest pain.  EKG shows sinus rhythm with no acute ischemic changes or evidence of infarction.  Most recent cholesterol level is normal.  Stress test and echocardiogram in 2021 were normal prior to her AAA repair.  Overall seems to be doing quite well.  Denies any major symptoms in office.  I did discuss with her that her shortness of breath is likely stress related.  She is able to complete a very high level of activity without any major limitations.  This is very reassuring.   Problem List 1. AAA (5.7 cm infrarenal) -s/p open repair 08/28/2019 2. 3-vessel CAD  -calcifications seen on CT -negative nuc med MPI 07/19/2019 -EF 55-60% 3. Former tobacco abuse  4. HTN 5. HLD -total cholesterol 145, HDL 75, LDL 63, TG 51    Past Medical History: Past Medical History:  Diagnosis Date   AAA (abdominal aortic aneurysm)    Allergy    Essential hypertension h   Family  history of anesthesia complication    daughter gets nauseated   Hearing loss    wears hearing aids   Hypertension    Knee joint replacement status    Osteoarthritis    Osteopenia    Osteoporosis    Presence of intracervical pessary    Uterine prolapse    Wears glasses    Wears partial dentures    upper and lower partial    Past Surgical History: Past Surgical History:  Procedure Laterality Date   ABDOMINAL AORTIC ANEURYSM REPAIR N/A 08/28/2019   Procedure: ANEURYSM ABDOMINAL AORTIC REPAIR OPEN;  Surgeon: Angelia Mould, MD;  Location: [facility] OR;  Service: Vascular;  Laterality: N/A;   COLONOSCOPY     EYE SURGERY     cataracts removal   HERNIA REPAIR  07/07/12   Dr Aretha Parrot HERNIA REPAIR  07/07/2012   Procedure: HERNIA REPAIR INGUINAL ADULT;  Surgeon: Haywood Lasso, MD;  Location: [facility];  Service: General;  Laterality: Left;  left inguinal area   REPLACEMENT TOTAL KNEE BILATERAL  2003   TONSILLECTOMY  1945   WISDOM TOOTH EXTRACTION      Current Medications: Current Meds  Medication Sig   acetaminophen (TYLENOL) 500 MG tablet Take 500 mg by mouth every 6 (six) hours as needed (  ARTHRITIS PAIN.).   Alpha Lipoic Acid 200 MG CAPS See admin instructions.   aspirin EC 81 MG tablet Take 1 tablet (81 mg total) by mouth daily.   BENFOTIAMINE PO Take 300 mg by mouth daily.   CALCIUM CITRATE PO Take 1-2 tablets by mouth at bedtime.   Cholecalciferol (VITAMIN D3) 10 MCG (400 UNIT) tablet Take 400 Units by mouth daily.   denosumab (PROLIA) 60 MG/ML SOSY injection Inject 60 mg into the skin every 6 (six) months.   estradiol (ESTRACE) 0.1 MG/GM vaginal cream Place 1 Applicatorful vaginally 2 (two) times a week.    ibuprofen (ADVIL) 200 MG tablet 2 tabs   Magnesium 250 MG TABS Take 250 mg by mouth daily.    metoprolol succinate (TOPROL-XL) 25 MG 24 hr tablet Take 1 tablet (25 mg total) by mouth daily.   Multiple Vitamin (MULTIVITAMIN WITH MINERALS) TABS  tablet Take 1 tablet by mouth daily.   polyethylene glycol (MIRALAX / GLYCOLAX) 17 g packet 1/2 packet mixed with 8 ounces of fluid   rosuvastatin (CRESTOR) 10 MG tablet TAKE 1 TABLET(10 MG) BY MOUTH DAILY     Allergies:    Penicillins   Social History: Social History   Socioeconomic History   Marital status: Divorced    Spouse name: Not on file   Number of children: 2   Years of education: Not on file   Highest education level: Not on file  Occupational History   Occupation: retired Arts development officer  Tobacco Use   Smoking status: Former    Years: 35.00    Types: Cigarettes    Start date: 07/05/2004    Quit date: 06/30/2005    Years since quitting: 16.0   Smokeless tobacco: Never  Vaping Use   Vaping Use: Never used  Substance and Sexual Activity   Alcohol use: Yes    Alcohol/week: 7.0 standard drinks    Types: 7 Glasses of wine per week   Drug use: No   Sexual activity: Not Currently    Birth control/protection: Post-menopausal  Other Topics Concern   Not on file  Social History Narrative   Divorced- ex husband now deceased. Lives alone in townhome.    Daughter lives in town, son lives in town.       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  ROS:   All other ROS reviewed and negative. Pertinent positives noted in the HPI.     EKGs/Labs/Other Studies Reviewed:   The following studies were personally reviewed by me today:  EKG:  EKG is ordered today.  The ekg ordered  today demonstrates NSR 84 bpm, and was personally reviewed by me.   Vasc US 11/26/2020  Summary:  Right: Resting right ankle-brachial index is within normal range. No  evidence of significant right lower extremity arterial disease. The right  toe-brachial index is normal. RT great toe pressure = 82 mmHg.   Left: Resting left ankle-brachial index is within normal range.  No  evidence of significant left lower extremity arterial disease. The left  toe-brachial index is normal. LT Great toe pressure = 83 mmHg.   SPECT 07/19/2019 1. Normal myocardial perfusion imaging study without evidence of ischemia or prior infarction. 2. Normal left ventricular ejection fraction, greater than 65%.   Recent Labs: 09/22/2020: ALT 17; BUN 26; Creatinine 0.7; Hemoglobin 13.3; Potassium 4.5; Sodium 139; TSH 1.19   Recent Lipid Panel    Component Value Date/Time   CHOL 145 09/22/2020 0000   TRIG 51 09/22/2020 0000   HDL 75 (A) 09/22/2020 0000   LDLCALC 63 09/22/2020 0000    Physical Exam:   VS:  BP 132/80    Pulse 84    Ht 4\' 10"  (1.473 m)    Wt 131 lb (59.4 kg)    LMP  (LMP Unknown)    SpO2 97%    BMI 27.38 kg/m    Wt Readings from Last 3 Encounters:  07/13/21 131 lb (59.4 kg)  06/05/21 131 lb 3.2 oz (59.5 kg)  05/04/21 130 lb (59 kg)    General: Well nourished, well developed, in no acute distress Head: Atraumatic, normal size  Eyes: PEERLA, EOMI  Neck: Supple, no JVD Endocrine: No thryomegaly Cardiac: Normal S1, S2; RRR; no murmurs, rubs, or gallops Lungs: Clear to auscultation bilaterally, no wheezing, rhonchi or rales  Abd: Soft, nontender, no hepatomegaly  Ext: No edema, pulses 2+ Musculoskeletal: No deformities, BUE and BLE strength normal and equal Skin: Warm and dry, no rashes   Neuro: Alert and oriented to person, place, time, and situation, CNII-XII grossly intact, no focal deficits  Psych: Normal mood and affect   ASSESSMENT:   Abigail Cohen is a 81 y.o. female who presents for  the following: 1. Abdominal aortic aneurysm (AAA) without rupture, unspecified part   2. Coronary artery calcification seen on computed tomography   3. Mixed hyperlipidemia   4. SOB (shortness of breath) on exertion     PLAN:   1. Abdominal aortic aneurysm (AAA) without rupture, unspecified part 2. Coronary artery calcification seen on computed tomography 3. Mixed hyperlipidemia -She underwent open repair of an infrarenal aortic aneurysm in February 2021.  She has done well since that time.  Most recent ABIs are normal.  She is on aspirin.  She is also on Crestor.  Most recent LDL is at goal. -She underwent stress test and echo that were normal prior to surgery. -She has been seen by vascular surgery.  They have no further plans for evaluation. -I would like to see her back in 1 year and repeat an abdominal ultrasound.  If things are stable likely can just pursue as needed imaging. -Overall has no symptoms.  Doing well.  4. SOB (shortness of breath) on exertion -She reports shortness of breath which mainly occur with anxiety and stress.  She reports they also can occur upon standing.  She is able to complete a high level of activity.  She walks 7000 steps daily without any limitations.  She actually can use an exercise bike which is an elliptical machine for 35 to 40 minutes without significant shortness of breath.  She had a stress test and echocardiogram prior to her surgery which were normal.  Her cardiovascular examination is normal today.  Her EKG in office is normal.  I highly suspect this is just stress related.  I see no need for further testing.  We will keep an eye on it for now.  Given her high  level of activity I believe it is safe to say that her heart is healthy.  She will let us know if there is a change in her activity level.  For now she seems to be doing well.  Disposition: Return in about 1 year (around 07/13/2022).  Medication Adjustments/Labs and Tests Ordered: Current  medicines are reviewed at length with the patient today.  Concerns regarding medicines are outlined above.  Orders Placed This Encounter  Procedures   EKG 12-Lead   VAS [place] AAA DUPLEX   No orders of the defined types were placed in this encounter.   Patient Instructions  Medication Instructions:  The current medical regimen is effective;  continue present plan and medications.  *If you need a refill on your cardiac medications before your next appointment, please call your pharmacy*   Testing/Procedures: Your physician has requested that you have an abdominal aorta duplex (12 months). During this test, an ultrasound is used to evaluate the aorta. Allow 30 minutes for this exam. Do not eat after midnight the day before and avoid carbonated beverages    Follow-Up: At [facility], you and your health needs are our priority.  As part of our continuing mission to provide you with exceptional heart care, we have created designated Provider Care Teams.  These Care Teams include your primary Cardiologist (physician) and Advanced Practice Providers (APPs -  Physician Assistants and Nurse Practitioners) who all work together to provide you with the care you need, when you need it.  We recommend signing up for the patient portal called "MyChart".  Sign up information is provided on this After Visit Summary.  MyChart is used to connect with patients for Virtual Visits (Telemedicine).  Patients are able to view lab/test results, encounter notes, upcoming appointments, etc.  Non-urgent messages can be sent to your provider as well.   To learn more about what you can do with MyChart, go to [email or web address].    Your next appointment:   12 month(s)  The format for your next appointment:   In Person  Provider:   Evalina Field, MD        Time Spent with Patient: I have spent a total of 25 minutes with patient reviewing hospital notes, telemetry, EKGs, labs and examining the patient as  well as establishing an assessment and plan that was discussed with the patient.  > 50% of time was spent in direct patient care.  Signed, Addison Naegeli. Audie Box, MD, [place]  [address], [place], [place] [address] [phone]  07/13/2021 11:04 AM

## 2021-07-11 DIAGNOSIS — Z20822 Contact with and (suspected) exposure to covid-19: Secondary | ICD-10-CM | POA: Diagnosis not present

## 2021-07-13 ENCOUNTER — Ambulatory Visit (INDEPENDENT_AMBULATORY_CARE_PROVIDER_SITE_OTHER): Payer: Medicare Other | Admitting: Cardiovascular Disease

## 2021-07-13 ENCOUNTER — Encounter: Payer: Self-pay | Admitting: Physical Therapy

## 2021-07-13 ENCOUNTER — Ambulatory Visit (INDEPENDENT_AMBULATORY_CARE_PROVIDER_SITE_OTHER): Payer: Medicare Other | Admitting: Physical Therapy

## 2021-07-13 ENCOUNTER — Other Ambulatory Visit: Payer: Self-pay

## 2021-07-13 ENCOUNTER — Encounter: Payer: Self-pay | Admitting: Cardiovascular Disease

## 2021-07-13 VITALS — BP 132/80 | HR 84 | Ht <= 58 in | Wt 131.0 lb

## 2021-07-13 DIAGNOSIS — R0602 Shortness of breath: Secondary | ICD-10-CM | POA: Diagnosis not present

## 2021-07-13 DIAGNOSIS — I714 Abdominal aortic aneurysm, without rupture, unspecified: Secondary | ICD-10-CM | POA: Diagnosis not present

## 2021-07-13 DIAGNOSIS — M5416 Radiculopathy, lumbar region: Secondary | ICD-10-CM | POA: Diagnosis not present

## 2021-07-13 DIAGNOSIS — M25572 Pain in left ankle and joints of left foot: Secondary | ICD-10-CM

## 2021-07-13 DIAGNOSIS — R2689 Other abnormalities of gait and mobility: Secondary | ICD-10-CM

## 2021-07-13 DIAGNOSIS — R293 Abnormal posture: Secondary | ICD-10-CM

## 2021-07-13 DIAGNOSIS — E782 Mixed hyperlipidemia: Secondary | ICD-10-CM

## 2021-07-13 DIAGNOSIS — I2510 Atherosclerotic heart disease of native coronary artery without angina pectoris: Secondary | ICD-10-CM | POA: Diagnosis not present

## 2021-07-13 NOTE — Therapy (Signed)
OUTPATIENT PHYSICAL THERAPY TREATMENT NOTE    Patient Name: Abigail Cohen MRN: [id number] DOB:03/25/1941, 81 y.o., female Today's Date: 07/13/21     PT End of Session - 07/13/21 1526     Visit Number 4    Number of Visits 16    Date for PT Re-Evaluation 08/05/21    Authorization Type Medicare    PT Start Time 1435    PT Stop Time 2094    PT Time Calculation (min) 40 min    Equipment Utilized During Treatment Gait belt    Activity Tolerance Patient tolerated treatment well    Behavior During Therapy WFL for tasks assessed/performed              Past Medical History:  Diagnosis Date   AAA (abdominal aortic aneurysm)    Allergy    Essential hypertension h   Family history of anesthesia complication    daughter gets nauseated   Hearing loss    wears hearing aids   Hypertension    Knee joint replacement status    Osteoarthritis    Osteopenia    Osteoporosis    Presence of intracervical pessary    Uterine prolapse    Wears glasses    Wears partial dentures    upper and lower partial   Past Surgical History:  Procedure Laterality Date   ABDOMINAL AORTIC ANEURYSM REPAIR N/A 08/28/2019   Procedure: ANEURYSM ABDOMINAL AORTIC REPAIR OPEN;  Surgeon: Angelia Mould, MD;  Location[facility] OR;  Service: Vascular;  Laterality: N/A;   COLONOSCOPY     EYE SURGERY     cataracts removal   HERNIA REPAIR  07/07/12   Dr Aretha Parrot HERNIA REPAIR  07/07/2012   Procedure: HERNIA REPAIR INGUINAL ADULT;  Surgeon: Haywood Lasso, MD;  Location: [facility];  Service: General;  Laterality: Left;  left inguinal area   REPLACEMENT TOTAL KNEE BILATERAL  2003   [place] EXTRACTION     Patient Active Problem List   Diagnosis Date Noted   nonobstructive CAD- follows with Dr. Audie Box cardiology  05/04/2021   Hyperlipidemia 10/30/2020   Essential hypertension 10/30/2020   Allergic rhinitis 10/30/2020   Osteoarthritis 10/30/2020    Abdominal aortic aneurysm (AAA) 08/28/2019   Osteoporosis 08/02/2016   Left inguinal hernia 06/20/2012    PCP: Marin Olp, MD  REFERRING PROVIDER: Marin Olp, MD  REFERRING DIAG: R sciatica, Gait abnormality.  THERAPY DIAG:  Radiculopathy, lumbar region  Other abnormalities of gait and mobility  Abnormal posture  Pain in left ankle and joints of left foot   SUBJECTIVE:  SUBJECTIVE STATEMENT: 07/13/21:  Pt states much less pain in back and leg. Has been doing HEP  PERTINENT HISTORY:  Bil TKA, Osteoporosis, Significant scoliosis, AAA, Neuropathy,   PAIN:  Are you having pain? Yes, pain much better.  VAS scale: 0/10 Pain location: Leg  Pain orientation: Right  PAIN TYPE: burning and radiating  Pain description: intermittent  Aggravating factors: increased activity, transfers, standing, walking  Relieving factors: rest, heat    VAS scale: 5/10 Pain location: shoulder Pain orientation:Right  PAIN TYPE: aching   Pain description: intermittent  Aggravating factors: reaching, elevation  Relieving factors: rest, heat     PATIENT GOALS  decreased pain    OBJECTIVE:   06/10/21:  LUMBARAROM/PROM Lumbar ROM: mod/significant limitation for extension and R SB, Flexion, mild limitation.   Hips: mild limitation for ER and Ext Knees: WFL   LE MMT:  MMT Right 06/10/21 Left 06/10/21  Hip flexion 4- 4-  Hip extension    Hip abduction 3 4-  Hip adduction    Hip internal rotation    Hip external rotation 4- 4-  Knee flexion 4+ 4+  Knee extension 4 4                   (Blank rows = not tested)  LUMBAR SPECIAL TESTS:  Neg SLR    GAIT: posture abnormalities from scoliosis, increased lateral trunk sway, decreased stability in hips and feet,  poor heel to toe  propulsion in feet, poor push off.    TODAY'S TREATMENT  07/13/21:   Therapeutic Exercise: Aerobic: Supine:  Standing: March x 20,   shoulder ER RTB x 15; scap squeeze x 10 (hands at sides); Step ups 4 in x 10 bil, 1 HR, 6 in x 10 bil, 1 HR;  Seated: sit to stand x 15;  S/L:   Neuromuscular Re-education: Standing weight shifts staggered stance, x 20 each bil; fwd/bwd stepping with weight shifts x 25 ea bil; Bwd walking 25 ft x 6; Torso turns x 10 bil;  Tandem stance 30 sec x 2 bil, with head turns x 10 bil;  Gait:  35 ft x 6 without SPC, cueing for improving heel strike on R to decrease midfoot stance Manual Therapy: Therapeutic Activity:    07/09/21:   Therapeutic Exercise: Aerobic: Supine: SLR x 10 bil with TA; shoulder flexion AAROM hands clasped, x 10;  Seated: sit to stand x 10 , scap squeezes x 10;  Standing: March x 20, hip abd 2x10 bil;  shoulder ER RTB x 15;  S/L: hip abd x15 bil;  Stretches:   Neuromuscular Re-education: Standing weight shifts staggered stance, x 20 each bil; fwd/bwd stepping with weight shifts x 25 ea bil;  Gait:  35 ft x 6 without SPC, cueing for improving heel strike on R to decrease midfoot stance; Manual Therapy: Therapeutic Activity: Sit to stand x10  Self Care: updated and reviewed HEP in detail.     PATIENT EDUCATION:  Education details: reviewed and updated HEP  Person educated: Patient Education method: Explanation, Demonstration, Tactile cues, Verbal cues, and Handouts Education comprehension: verbalized understanding, returned demonstration, verbal cues required, tactile cues required, and needs further education   HOME EXERCISE PROGRAM: Access Code: [email or web address]   ASSESSMENT:  CLINICAL IMPRESSION: 07/09/21:  Pt challenged with most exercises due to weakness and instability. Improvement noted with hip abd in s/l. Focus for anterior weight shifts and propulsion with pre gait and gait today, to decrease instability and lateral sway with  gait.  She does have increased pain in R shoulder, likely OA, discussed referral for shoulder pain to MD. Reviewed ther ex for R shoulder for HEP, and discussed not over doing activity. Pt to benefit from continued strengthening, balance and gait.   Objective impairments include Abnormal gait, decreased activity tolerance, decreased balance, decreased coordination, decreased knowledge of use of DME, decreased mobility, difficulty walking, decreased ROM, decreased strength, hypomobility, increased muscle spasms, impaired flexibility, improper body mechanics, postural dysfunction, and pain. These impairments are limiting patient from cleaning, community activity, shopping, and yard work. Personal factors including 1 comorbidity: osteoporosis and 1-2 comorbidities: scoliosis, pain at multiple sites,   also affecting patient's functional outcome. Patient will benefit from skilled PT to address above impairments and improve overall function.    REHAB POTENTIAL: Good  CLINICAL DECISION MAKING: Evolving/moderate complexity  EVALUATION COMPLEXITY: Moderate   GOALS: Goals reviewed with patient? Yes  SHORT TERM GOALS: SHORT TERM GOALS:  STG Name Target Date Goal status  1 Patient will be independent with initial HEP for back and foot pain  06/24/21 INITIAL                   LONG TERM GOALS:   LTG Name Target Date Goal status  1 Pt to be independent with final HEP.   08/05/21 INITIAL  2 Pt to report decreased pain in low back and LE to 0-3/10 with standing and activity.   08/05/21 INITIAL  3 Pt to report decreased pain in R foot to 0-2/10 with standing and walking   08/05/21 INITIAL  4 Pt to demo improved ambulation pattern, with decreased lateral trunk sway,and improved balance, to be [facility], for improved efficiency.  08/05/21 INITIAL   5 Balance goal TBD.  08/05/21 INITIAL   6  Baseline:    7  Baseline:        PLAN: PT FREQUENCY: 1-2x/week  PT DURATION: 8 weeks  PLANNED INTERVENTIONS:  Therapeutic exercises, Therapeutic activity, Neuro Muscular re-education, Balance training, Gait training, Patient/Family education, Joint mobilization, Stair training, Orthotic/Fit training, DME instructions, Dry Needling, Electrical stimulation, Spinal mobilization, Cryotherapy, Moist heat, Taping, Traction, Ultrasound, Ionotophoresis 4mg /ml Dexamethasone, and Manual therapy   Lyndee Hensen, PT, DPT 3:29 PM  07/13/21

## 2021-07-13 NOTE — Patient Instructions (Signed)
Medication Instructions:  The current medical regimen is effective;  continue present plan and medications.  *If you need a refill on your cardiac medications before your next appointment, please call your pharmacy*   Testing/Procedures: Your physician has requested that you have an abdominal aorta duplex (12 months). During this test, an ultrasound is used to evaluate the aorta. Allow 30 minutes for this exam. Do not eat after midnight the day before and avoid carbonated beverages    Follow-Up: At [facility], you and your health needs are our priority.  As part of our continuing mission to provide you with exceptional heart care, we have created designated Provider Care Teams.  These Care Teams include your primary Cardiologist (physician) and Advanced Practice Providers (APPs -  Physician Assistants and Nurse Practitioners) who all work together to provide you with the care you need, when you need it.  We recommend signing up for the patient portal called "MyChart".  Sign up information is provided on this After Visit Summary.  MyChart is used to connect with patients for Virtual Visits (Telemedicine).  Patients are able to view lab/test results, encounter notes, upcoming appointments, etc.  Non-urgent messages can be sent to your provider as well.   To learn more about what you can do with MyChart, go to [email or web address].    Your next appointment:   12 month(s)  The format for your next appointment:   In Person  Provider:   Evalina Field, MD

## 2021-07-14 ENCOUNTER — Encounter: Payer: Medicare Other | Admitting: Physical Therapy

## 2021-07-16 ENCOUNTER — Encounter: Payer: Medicare Other | Admitting: Physical Therapy

## 2021-07-16 ENCOUNTER — Ambulatory Visit (INDEPENDENT_AMBULATORY_CARE_PROVIDER_SITE_OTHER): Payer: Medicare Other | Admitting: Physical Therapy

## 2021-07-16 ENCOUNTER — Other Ambulatory Visit: Payer: Self-pay

## 2021-07-16 DIAGNOSIS — R293 Abnormal posture: Secondary | ICD-10-CM | POA: Diagnosis not present

## 2021-07-16 DIAGNOSIS — M5416 Radiculopathy, lumbar region: Secondary | ICD-10-CM | POA: Diagnosis not present

## 2021-07-16 DIAGNOSIS — M25572 Pain in left ankle and joints of left foot: Secondary | ICD-10-CM | POA: Diagnosis not present

## 2021-07-16 DIAGNOSIS — R2689 Other abnormalities of gait and mobility: Secondary | ICD-10-CM

## 2021-07-16 NOTE — Therapy (Signed)
OUTPATIENT PHYSICAL THERAPY TREATMENT NOTE    Patient Name: Abigail Cohen MRN: [id number] DOB:14-Oct-1940, 81 y.o., female Today's Date: 07/16/21     PT End of Session - 07/19/21 1204     Visit Number 5    Number of Visits 16    Date for PT Re-Evaluation 08/05/21    Authorization Type Medicare    PT Start Time 1345    PT Stop Time 1427    PT Time Calculation (min) 42 min    Equipment Utilized During Treatment Gait belt    Activity Tolerance Patient tolerated treatment well    Behavior During Therapy WFL for tasks assessed/performed             Past Medical History:  Diagnosis Date   AAA (abdominal aortic aneurysm)    Allergy    Essential hypertension h   Family history of anesthesia complication    daughter gets nauseated   Hearing loss    wears hearing aids   Hypertension    Knee joint replacement status    Osteoarthritis    Osteopenia    Osteoporosis    Presence of intracervical pessary    Uterine prolapse    Wears glasses    Wears partial dentures    upper and lower partial   Past Surgical History:  Procedure Laterality Date   ABDOMINAL AORTIC ANEURYSM REPAIR N/A 08/28/2019   Procedure: ANEURYSM ABDOMINAL AORTIC REPAIR OPEN;  Surgeon: Angelia Mould, MD;  Location: [facility] OR;  Service: Vascular;  Laterality: N/A;   COLONOSCOPY     EYE SURGERY     cataracts removal   HERNIA REPAIR  07/07/12   Dr Aretha Parrot HERNIA REPAIR  07/07/2012   Procedure: HERNIA REPAIR INGUINAL ADULT;  Surgeon: Haywood Lasso, MD;  Location: [place];  Service: General;  Laterality: Left;  left inguinal area   REPLACEMENT TOTAL KNEE BILATERAL  2003   [place] EXTRACTION     Patient Active Problem List   Diagnosis Date Noted   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 Audie Box cardiology  05/04/2021   Hyperlipidemia 10/30/2020   Essential hypertension 10/30/2020   Allergic rhinitis 10/30/2020   Osteoarthritis 10/30/2020    Abdominal aortic aneurysm (AAA) 08/28/2019   Osteoporosis 08/02/2016   Left inguinal hernia 06/20/2012    PCP: Marin Olp, MD  REFERRING PROVIDER: Marin Olp, MD  REFERRING DIAG: R sciatica, Gait abnormality.  THERAPY DIAG:  Radiculopathy, lumbar region  Other abnormalities of gait and mobility  Abnormal posture  Pain in left ankle and joints of left foot   SUBJECTIVE:  SUBJECTIVE STATEMENT: Pt with minimal pain in leg. Has been doing HEP.   PERTINENT HISTORY:  Bil TKA, Osteoporosis, Significant scoliosis, AAA, Neuropathy,   PAIN:  Are you having pain? Yes, pain much better.  VAS scale: 0/10 Pain location: Leg  Pain orientation: Right  PAIN TYPE: burning and radiating  Pain description: intermittent  Aggravating factors: increased activity, transfers, standing, walking  Relieving factors: rest, heat    VAS scale: 5/10 Pain location: shoulder Pain orientation:Right  PAIN TYPE: aching   Pain description: intermittent  Aggravating factors: reaching, elevation  Relieving factors: rest, heat     PATIENT GOALS  decreased pain    OBJECTIVE:   06/10/21:  LUMBARAROM/PROM Lumbar ROM: mod/significant limitation for extension and R SB, Flexion, mild limitation.   Hips: mild limitation for ER and Ext Knees: WFL   LE MMT:  MMT Right 06/10/21 Left 06/10/21  Hip flexion 4- 4-  Hip extension    Hip abduction 3 4-  Hip adduction    Hip internal rotation    Hip external rotation 4- 4-  Knee flexion 4+ 4+  Knee extension 4 4                   (Blank rows = not tested)  LUMBAR SPECIAL TESTS:  Neg SLR    GAIT: posture abnormalities from scoliosis, increased lateral trunk sway, decreased stability in hips and feet,  poor heel to toe propulsion in feet, poor push  off.    TODAY'S TREATMENT  07/16/21: Therapeutic Exercise: Aerobic: Supine:  Standing: March x 20,   shoulder ER RTB x 15; scap squeeze x 10 (hands at sides); Step ups 4 in x 10 bil, 1 HR, 6 in x 10 bil, 1 HR;  Seated: sit to stand x 15;  S/L:   Neuromuscular Re-education: Standing weight shifts staggered stance, x 20 each bil; fwd/bwd stepping with weight shifts x 25 ea bil; Bwd walking 25 ft x 6; Torso turns x 10 bil;  Tandem stance 30 sec x 2 bil, with head turns x 10 bil;  Gait:  35 ft x 6 without SPC, cueing for improving heel strike on R to decrease midfoot stance Manual Therapy: Therapeutic Activity:  Self care: Pt given new heel lift for L shoe, same size as current one, but newer/less compressed. Pt reports good fit and comfort.     07/13/21:   Therapeutic Exercise: Aerobic: Supine:  Standing: March x 20,   shoulder ER RTB x 15; scap squeeze x 10 (hands at sides); Step ups 4 in x 10 bil, 1 HR,  Seated: sit to stand x 15;    Neuromuscular Re-education: Standing bwd weight shifts x 20  bil; lateral stepping with weight shifts x 25 ea bil;  side stepping 25 ft x 4;  Tandem stance 30 sec x 2 bil;  Gait:  35 ft x 6 without SPC, cueing for improving heel strike on R to decrease midfoot stance Manual Therapy: Therapeutic Activity:    07/09/21:   Therapeutic Exercise: Aerobic: Supine: SLR x 10 bil with TA; shoulder flexion AAROM hands clasped, x 10;  Seated: sit to stand x 10 , scap squeezes x 10;  Standing: March x 20, hip abd 2x10 bil;  shoulder ER RTB x 15;  S/L: hip abd x15 bil;  Stretches:   Neuromuscular Re-education: Standing weight shifts staggered stance, x 20 each bil; fwd/bwd stepping with weight shifts x 25 ea bil;  Gait:  35 ft x 6 without SPC,  cueing for improving heel strike on R to decrease midfoot stance; Manual Therapy: Therapeutic Activity: Sit to stand x10  Self Care: updated and reviewed HEP in detail.     PATIENT EDUCATION:  Education details:  reviewed and updated HEP  Person educated: Patient Education method: Explanation, Demonstration, Tactile cues, Verbal cues, and Handouts Education comprehension: verbalized understanding, returned demonstration, verbal cues required, tactile cues required, and needs further education   HOME EXERCISE PROGRAM: Access Code: [email or web address]   ASSESSMENT:  CLINICAL IMPRESSION: 07/16/21 Pt with improving stability with weight shifts, and improving gait pattern and stability with slow speeds. Pt given new heel lift in shoe today, she has already been wearing full size heel lift in shoe, just updated to new one. Height of heel lift is not enough to even her pelvic asymmetry caused by scoliosis, but is helpful for decreasing some of it. She has good ability for step ups today, has increased pain in R knee at times. Plan to continue balance exercises and dynamic balance.    Objective impairments include Abnormal gait, decreased activity tolerance, decreased balance, decreased coordination, decreased knowledge of use of DME, decreased mobility, difficulty walking, decreased ROM, decreased strength, hypomobility, increased muscle spasms, impaired flexibility, improper body mechanics, postural dysfunction, and pain. These impairments are limiting patient from cleaning, community activity, shopping, and yard work. Personal factors including 1 comorbidity: osteoporosis and 1-2 comorbidities: scoliosis, pain at multiple sites,   also affecting patient's functional outcome. Patient will benefit from skilled PT to address above impairments and improve overall function.    REHAB POTENTIAL: Good  CLINICAL DECISION MAKING: Evolving/moderate complexity  EVALUATION COMPLEXITY: Moderate   GOALS: Goals reviewed with patient? Yes  SHORT TERM GOALS: SHORT TERM GOALS:  STG Name Target Date Goal status  1 Patient will be independent with initial HEP for back and foot pain  06/24/21 INITIAL                   LONG  TERM GOALS:   LTG Name Target Date Goal status  1 Pt to be independent with final HEP.   08/05/21 INITIAL  2 Pt to report decreased pain in low back and LE to 0-3/10 with standing and activity.   08/05/21 INITIAL  3 Pt to report decreased pain in R foot to 0-2/10 with standing and walking   08/05/21 INITIAL  4 Pt to demo improved ambulation pattern, with decreased lateral trunk sway,and improved balance, to be [facility], for improved efficiency.  08/05/21 INITIAL   5 Balance goal TBD.  08/05/21 INITIAL   6  Baseline:    7  Baseline:        PLAN: PT FREQUENCY: 1-2x/week  PT DURATION: 8 weeks  PLANNED INTERVENTIONS: Therapeutic exercises, Therapeutic activity, Neuro Muscular re-education, Balance training, Gait training, Patient/Family education, Joint mobilization, Stair training, Orthotic/Fit training, DME instructions, Dry Needling, Electrical stimulation, Spinal mobilization, Cryotherapy, Moist heat, Taping, Traction, Ultrasound, Ionotophoresis 4mg /ml Dexamethasone, and Manual therapy   Lyndee Hensen, PT, DPT 12:06 PM  07/19/21

## 2021-07-19 ENCOUNTER — Encounter: Payer: Self-pay | Admitting: Physical Therapy

## 2021-07-20 ENCOUNTER — Encounter: Payer: Self-pay | Admitting: Physical Therapy

## 2021-07-20 ENCOUNTER — Encounter: Payer: Medicare Other | Admitting: Physical Therapy

## 2021-07-20 ENCOUNTER — Ambulatory Visit (INDEPENDENT_AMBULATORY_CARE_PROVIDER_SITE_OTHER): Payer: Medicare Other | Admitting: Physical Therapy

## 2021-07-20 ENCOUNTER — Other Ambulatory Visit: Payer: Self-pay

## 2021-07-20 DIAGNOSIS — R2689 Other abnormalities of gait and mobility: Secondary | ICD-10-CM | POA: Diagnosis not present

## 2021-07-20 DIAGNOSIS — R293 Abnormal posture: Secondary | ICD-10-CM | POA: Diagnosis not present

## 2021-07-20 DIAGNOSIS — M5416 Radiculopathy, lumbar region: Secondary | ICD-10-CM

## 2021-07-20 DIAGNOSIS — M25572 Pain in left ankle and joints of left foot: Secondary | ICD-10-CM | POA: Diagnosis not present

## 2021-07-20 NOTE — Therapy (Signed)
OUTPATIENT PHYSICAL THERAPY TREATMENT NOTE    Patient Name: Abigail Cohen MRN: [id number] DOB:[birth year], 81 y.o., female Today's Date: 07/20/21     PT End of Session - 07/20/21 1049     Visit Number 6    Number of Visits 16    Date for PT Re-Evaluation 08/05/21    Authorization Type Medicare    PT Start Time 0814    PT Stop Time 1102    PT Time Calculation (min) 39 min    Equipment Utilized During Treatment Gait belt    Activity Tolerance Patient tolerated treatment well    Behavior During Therapy WFL for tasks assessed/performed              Past Medical History:  Diagnosis Date   AAA (abdominal aortic aneurysm)    Allergy    Essential hypertension h   Family history of anesthesia complication    daughter gets nauseated   Hearing loss    wears hearing aids   Hypertension    Knee joint replacement status    Osteoarthritis    Osteopenia    Osteoporosis    Presence of intracervical pessary    Uterine prolapse    Wears glasses    Wears partial dentures    upper and lower partial   Past Surgical History:  Procedure Laterality Date   ABDOMINAL AORTIC ANEURYSM REPAIR N/A 08/28/2019   Procedure: ANEURYSM ABDOMINAL AORTIC REPAIR OPEN;  Surgeon: Angelia Mould, MD;  Location: [facility] OR;  Service: Vascular;  Laterality: N/A;   COLONOSCOPY     EYE SURGERY     cataracts removal   HERNIA REPAIR  07/07/12   Dr Aretha Parrot HERNIA REPAIR  07/07/2012   Procedure: HERNIA REPAIR INGUINAL ADULT;  Surgeon: Haywood Lasso, MD;  Location: [place];  Service: General;  Laterality: Left;  left inguinal area   REPLACEMENT TOTAL KNEE BILATERAL  2003   [place] EXTRACTION     Patient Active Problem List   Diagnosis Date Noted   nonobstructive CAD- follows with Dr. Audie Box cardiology  05/04/2021   Hyperlipidemia 10/30/2020   Essential hypertension 10/30/2020   Allergic rhinitis 10/30/2020   Osteoarthritis 10/30/2020    Abdominal aortic aneurysm (AAA) 08/28/2019   Osteoporosis 08/02/2016   Left inguinal hernia 06/20/2012    PCP: Marin Olp, MD  REFERRING PROVIDER: Marin Olp, MD  REFERRING DIAG: R sciatica, Gait abnormality.  THERAPY DIAG:  Radiculopathy, lumbar region  Other abnormalities of gait and mobility  Abnormal posture  Pain in left ankle and joints of left foot   SUBJECTIVE:  SUBJECTIVE STATEMENT: Pt with minimal pain in leg. Has been doing HEP.   PERTINENT HISTORY:  Bil TKA, Osteoporosis, Significant scoliosis, AAA, Neuropathy,   PAIN:  Are you having pain? Yes, pain much better.  VAS scale: 0/10 Pain location: Leg  Pain orientation: Right  PAIN TYPE: burning and radiating  Pain description: intermittent  Aggravating factors: increased activity, transfers, standing, walking  Relieving factors: rest, heat    VAS scale: 5/10 Pain location: shoulder Pain orientation:Right  PAIN TYPE: aching   Pain description: intermittent  Aggravating factors: reaching, elevation  Relieving factors: rest, heat     PATIENT GOALS  decreased pain    OBJECTIVE:   06/10/21:  LUMBARAROM/PROM Lumbar ROM: mod/significant limitation for extension and R SB, Flexion, mild limitation.   Hips: mild limitation for ER and Ext Knees: WFL   LE MMT:  MMT Right 06/10/21 Left 06/10/21  Hip flexion 4- 4-  Hip extension    Hip abduction 3 4-  Hip adduction    Hip internal rotation    Hip external rotation 4- 4-  Knee flexion 4+ 4+  Knee extension 4 4                   (Blank rows = not tested)  LUMBAR SPECIAL TESTS:  Neg SLR    GAIT: posture abnormalities from scoliosis, increased lateral trunk sway, decreased stability in hips and feet,  poor heel to toe propulsion in feet, poor push  off.    TODAY'S TREATMENT  07/20/21: Therapeutic Exercise: Aerobic: Supine: bridging x 15 ; SLR x 10 bil;  Standing: March x 25,   Seated: LAQ 3 lb x 15 bil;  S/L: hip abd x 15 bil;   Neuromuscular Re-education: fwd/bwd stepping with weight shifts x 25 ea bil;  Bwd walking 25 ft x 6; Side stepping 25 ft x 4; Tandem stance 30 sec x 2 bil, with head turns x 10 bil;  Gait:  35 ft x 3 without AD, each with slow and medium speeds Manual Therapy: Therapeutic Activity:  Self care:     07/16/21: Therapeutic Exercise: Aerobic: Supine:  Standing: March x 20,   shoulder ER RTB x 15; scap squeeze x 10 (hands at sides); Step ups 4 in x 10 bil, 1 HR, 6 in x 10 bil, 1 HR;  Seated: sit to stand x 15;  S/L:   Neuromuscular Re-education: Standing weight shifts staggered stance, x 20 each bil; fwd/bwd stepping with weight shifts x 25 ea bil; Bwd walking 25 ft x 6; Torso turns x 10 bil;  Tandem stance 30 sec x 2 bil, with head turns x 10 bil;  Gait:  35 ft x 6 without SPC, cueing for improving heel strike on R to decrease midfoot stance Manual Therapy: Therapeutic Activity:  Self care: Pt given new heel lift for L shoe, same size as current one, but newer/less compressed. Pt reports good fit and comfort.     07/13/21:   Therapeutic Exercise: Aerobic: Supine:  Standing: March x 20,   shoulder ER RTB x 15; scap squeeze x 10 (hands at sides); Step ups 4 in x 10 bil, 1 HR,  Seated: sit to stand x 15;    Neuromuscular Re-education: Standing bwd weight shifts x 20  bil; lateral stepping with weight shifts x 25 ea bil;  side stepping 25 ft x 4;  Tandem stance 30 sec x 2 bil;  Gait:  35 ft x 6 without SPC, cueing for improving heel strike  on R to decrease midfoot stance Manual Therapy: Therapeutic Activity:    PATIENT EDUCATION:  Education details: reviewed and updated HEP  Person educated: Patient Education method: Explanation, Demonstration, Tactile cues, Verbal cues, and Handouts Education  comprehension: verbalized understanding, returned demonstration, verbal cues required, tactile cues required, and needs further education   HOME EXERCISE PROGRAM: Access Code: [email or web address]   ASSESSMENT:  CLINICAL IMPRESSION: 07/20/21: Pt with improving stability with pre-gait weight shifts as well as with gait. Improved stability at slower speeds, will continue to work on control at increased speed of walking. She will benefit from continued progression of strengthening for LEs/hips and for dynamic balance.     Objective impairments include Abnormal gait, decreased activity tolerance, decreased balance, decreased coordination, decreased knowledge of use of DME, decreased mobility, difficulty walking, decreased ROM, decreased strength, hypomobility, increased muscle spasms, impaired flexibility, improper body mechanics, postural dysfunction, and pain. These impairments are limiting patient from cleaning, community activity, shopping, and yard work. Personal factors including 1 comorbidity: osteoporosis and 1-2 comorbidities: scoliosis, pain at multiple sites,   also affecting patient's functional outcome. Patient will benefit from skilled PT to address above impairments and improve overall function.    REHAB POTENTIAL: Good  CLINICAL DECISION MAKING: Evolving/moderate complexity  EVALUATION COMPLEXITY: Moderate   GOALS: Goals reviewed with patient? Yes  SHORT TERM GOALS: SHORT TERM GOALS:  STG Name Target Date Goal status  1 Patient will be independent with initial HEP for back and foot pain  06/24/21 INITIAL                   LONG TERM GOALS:   LTG Name Target Date Goal status  1 Pt to be independent with final HEP.   08/05/21 INITIAL  2 Pt to report decreased pain in low back and LE to 0-3/10 with standing and activity.   08/05/21 INITIAL  3 Pt to report decreased pain in R foot to 0-2/10 with standing and walking   08/05/21 INITIAL  4 Pt to demo improved ambulation pattern, with  decreased lateral trunk sway,and improved balance, to be [facility], for improved efficiency.  08/05/21 INITIAL   5 Balance goal TBD.  08/05/21 INITIAL   6  Baseline:    7  Baseline:        PLAN: PT FREQUENCY: 1-2x/week  PT DURATION: 8 weeks  PLANNED INTERVENTIONS: Therapeutic exercises, Therapeutic activity, Neuro Muscular re-education, Balance training, Gait training, Patient/Family education, Joint mobilization, Stair training, Orthotic/Fit training, DME instructions, Dry Needling, Electrical stimulation, Spinal mobilization, Cryotherapy, Moist heat, Taping, Traction, Ultrasound, Ionotophoresis 4mg /ml Dexamethasone, and Manual therapy   Lyndee Hensen, PT, DPT 11:55 AM  07/20/21

## 2021-07-21 ENCOUNTER — Encounter: Payer: Medicare Other | Admitting: Physical Therapy

## 2021-07-23 ENCOUNTER — Ambulatory Visit (INDEPENDENT_AMBULATORY_CARE_PROVIDER_SITE_OTHER): Payer: Medicare Other | Admitting: Physical Therapy

## 2021-07-23 ENCOUNTER — Other Ambulatory Visit: Payer: Self-pay

## 2021-07-23 DIAGNOSIS — M5416 Radiculopathy, lumbar region: Secondary | ICD-10-CM

## 2021-07-23 DIAGNOSIS — R2689 Other abnormalities of gait and mobility: Secondary | ICD-10-CM | POA: Diagnosis not present

## 2021-07-23 DIAGNOSIS — R293 Abnormal posture: Secondary | ICD-10-CM | POA: Diagnosis not present

## 2021-07-23 DIAGNOSIS — M25572 Pain in left ankle and joints of left foot: Secondary | ICD-10-CM | POA: Diagnosis not present

## 2021-07-23 NOTE — Therapy (Signed)
OUTPATIENT PHYSICAL THERAPY TREATMENT NOTE    Patient Name: Abigail Cohen MRN: [id number] DOB:1941/04/27, 81 y.o., female Today's Date: 07/20/21     PT End of Session - 07/23/21 1358     Visit Number 7    Number of Visits 16    Date for PT Re-Evaluation 08/05/21    Authorization Type Medicare    PT Start Time 1351    PT Stop Time 1430    PT Time Calculation (min) 39 min    Equipment Utilized During Treatment Gait belt    Activity Tolerance Patient tolerated treatment well    Behavior During Therapy WFL for tasks assessed/performed               Past Medical History:  Diagnosis Date   AAA (abdominal aortic aneurysm)    Allergy    Essential hypertension h   Family history of anesthesia complication    daughter gets nauseated   Hearing loss    wears hearing aids   Hypertension    Knee joint replacement status    Osteoarthritis    Osteopenia    Osteoporosis    Presence of intracervical pessary    Uterine prolapse    Wears glasses    Wears partial dentures    upper and lower partial   Past Surgical History:  Procedure Laterality Date   ABDOMINAL AORTIC ANEURYSM REPAIR N/A 08/28/2019   Procedure: ANEURYSM ABDOMINAL AORTIC REPAIR OPEN;  Surgeon: Angelia Mould, MD;  Location: [facility] OR;  Service: Vascular;  Laterality: N/A;   COLONOSCOPY     EYE SURGERY     cataracts removal   HERNIA REPAIR  07/07/12   Dr Aretha Parrot HERNIA REPAIR  07/07/2012   Procedure: HERNIA REPAIR INGUINAL ADULT;  Surgeon: Haywood Lasso, MD;  Location: [place];  Service: General;  Laterality: Left;  left inguinal area   REPLACEMENT TOTAL KNEE BILATERAL  2003   [place] EXTRACTION     Patient Active Problem List   Diagnosis Date Noted   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 Audie Box cardiology  05/04/2021   Hyperlipidemia 10/30/2020   Essential hypertension 10/30/2020   Allergic rhinitis 10/30/2020   Osteoarthritis 10/30/2020    Abdominal aortic aneurysm (AAA) 08/28/2019   Osteoporosis 08/02/2016   Left inguinal hernia 06/20/2012    PCP: Marin Olp, MD  REFERRING PROVIDER: Marin Olp, MD  REFERRING DIAG: R sciatica, Gait abnormality.  THERAPY DIAG:  Radiculopathy, lumbar region  Other abnormalities of gait and mobility  Abnormal posture  Pain in left ankle and joints of left foot   SUBJECTIVE:  SUBJECTIVE STATEMENT: Pt states pain in R shoulder. Has been doing exercises. Feels a little more "wobbly " today.    PERTINENT HISTORY:  Bil TKA, Osteoporosis, Significant scoliosis, AAA, Neuropathy,   PAIN:  Are you having pain? Yes, pain much better.  VAS scale: 0/10 Pain location: Leg  Pain orientation: Right  PAIN TYPE: burning and radiating  Pain description: intermittent  Aggravating factors: increased activity, transfers, standing, walking  Relieving factors: rest, heat    VAS scale: 5/10 Pain location: shoulder Pain orientation:Right  PAIN TYPE: aching   Pain description: intermittent  Aggravating factors: reaching, elevation  Relieving factors: rest, heat     PATIENT GOALS  decreased pain    OBJECTIVE:   06/10/21:  LUMBARAROM/PROM Lumbar ROM: mod/significant limitation for extension and R SB, Flexion, mild limitation.   Hips: mild limitation for ER and Ext Knees: WFL   LE MMT:  MMT Right 06/10/21 Left 06/10/21  Hip flexion 4- 4-  Hip extension    Hip abduction 3 4-  Hip adduction    Hip internal rotation    Hip external rotation 4- 4-  Knee flexion 4+ 4+  Knee extension 4 4                   (Blank rows = not tested)  LUMBAR SPECIAL TESTS:  Neg SLR    GAIT: posture abnormalities from scoliosis, increased lateral trunk sway, decreased stability in hips and feet,   poor heel to toe propulsion in feet, poor push off.    TODAY'S TREATMENT   07/23/21: Therapeutic Exercise: Aerobic: Supine: bridging x 15 ;  SLR x 10 bil;  Standing: March x 25, step ups 6 in step x 10 bil; Stairs, recipricol x 5, 1 HR;   Seated: LAQ 3 lb , 2x10 bil;  S/L:   Neuromuscular Re-education: AirEx: L/R and A/P weight shifts x 25 ea bil;  Step ups onto AirEx x 10 bil;  Gait:  35 ft x 3 without AD, each with slow and medium speeds; Walking with SPC 35 ft x 6 with education for proper sequencing; Bwd walking 25 ft x 4; Side stepping 25 ft x 4;  Manual Therapy: PROM for R shoulder, flexion and ER, manual pec stretch;  Therapeutic Activity:  Self care:      07/20/21: Therapeutic Exercise: Aerobic: Supine: bridging x 15 ; SLR x 10 bil;  Standing: March x 25,   Seated: LAQ 3 lb x 15 bil;  S/L: hip abd x 15 bil;   Neuromuscular Re-education: fwd/bwd stepping with weight shifts x 25 ea bil;  Bwd walking 25 ft x 6; Side stepping 25 ft x 4; Tandem stance 30 sec x 2 bil, with head turns x 10 bil;  Gait:  35 ft x 3 without AD, each with slow and medium speeds Manual Therapy: Therapeutic Activity:  Self care:     07/16/21: Therapeutic Exercise: Aerobic: Supine:  Standing: March x 20,   shoulder ER RTB x 15; scap squeeze x 10 (hands at sides); Step ups 4 in x 10 bil, 1 HR, 6 in x 10 bil, 1 HR;  Seated: sit to stand x 15;  S/L:   Neuromuscular Re-education: Standing weight shifts staggered stance, x 20 each bil; fwd/bwd stepping with weight shifts x 25 ea bil; Bwd walking 25 ft x 6; Torso turns x 10 bil;  Tandem stance 30 sec x 2 bil, with head turns x 10 bil;  Gait:  35 ft x 6 without  SPC, cueing for improving heel strike on R to decrease midfoot stance Manual Therapy: Therapeutic Activity:  Self care: Pt given new heel lift for L shoe, same size as current one, but newer/less compressed. Pt reports good fit and comfort.     PATIENT EDUCATION:  Education details:  reviewed and updated HEP  Person educated: Patient Education method: Explanation, Demonstration, Tactile cues, Verbal cues, and Handouts Education comprehension: verbalized understanding, returned demonstration, verbal cues required, tactile cues required, and needs further education   HOME EXERCISE PROGRAM: Access Code: [email or web address]   ASSESSMENT:  CLINICAL IMPRESSION: 07/23/21:   Pt with improved sequencing using SPC after education and practice today. Discussed using for longer distances or outdoors. She does have improved gait mechanics with use of SPC, with less trunk sway. She is challenged with all balance activities today, especially on AirEx. Pt to benefit from continued strength, stability/balance and gait.   Objective impairments include Abnormal gait, decreased activity tolerance, decreased balance, decreased coordination, decreased knowledge of use of DME, decreased mobility, difficulty walking, decreased ROM, decreased strength, hypomobility, increased muscle spasms, impaired flexibility, improper body mechanics, postural dysfunction, and pain. These impairments are limiting patient from cleaning, community activity, shopping, and yard work. Personal factors including 1 comorbidity: osteoporosis and 1-2 comorbidities: scoliosis, pain at multiple sites,   also affecting patient's functional outcome. Patient will benefit from skilled PT to address above impairments and improve overall function.    REHAB POTENTIAL: Good  CLINICAL DECISION MAKING: Evolving/moderate complexity  EVALUATION COMPLEXITY: Moderate   GOALS: Goals reviewed with patient? Yes  SHORT TERM GOALS: SHORT TERM GOALS:  STG Name Target Date Goal status  1 Patient will be independent with initial HEP for back and foot pain  06/24/21 INITIAL                   LONG TERM GOALS:   LTG Name Target Date Goal status  1 Pt to be independent with final HEP.   08/05/21 INITIAL  2 Pt to report decreased pain in low  back and LE to 0-3/10 with standing and activity.   08/05/21 INITIAL  3 Pt to report decreased pain in R foot to 0-2/10 with standing and walking   08/05/21 INITIAL  4 Pt to demo improved ambulation pattern, with decreased lateral trunk sway,and improved balance, to be [facility], for improved efficiency.  08/05/21 INITIAL   5 Balance goal TBD.  08/05/21 INITIAL   6  Baseline:    7  Baseline:        PLAN: PT FREQUENCY: 1-2x/week  PT DURATION: 8 weeks  PLANNED INTERVENTIONS: Therapeutic exercises, Therapeutic activity, Neuro Muscular re-education, Balance training, Gait training, Patient/Family education, Joint mobilization, Stair training, Orthotic/Fit training, DME instructions, Dry Needling, Electrical stimulation, Spinal mobilization, Cryotherapy, Moist heat, Taping, Traction, Ultrasound, Ionotophoresis 4mg /ml Dexamethasone, and Manual therapy   Lyndee Hensen, PT, DPT 3:00 PM  07/23/21

## 2021-07-24 ENCOUNTER — Encounter: Payer: Medicare Other | Admitting: Physical Therapy

## 2021-07-27 ENCOUNTER — Other Ambulatory Visit: Payer: Self-pay

## 2021-07-27 ENCOUNTER — Ambulatory Visit (INDEPENDENT_AMBULATORY_CARE_PROVIDER_SITE_OTHER): Payer: Medicare Other | Admitting: Physical Therapy

## 2021-07-27 ENCOUNTER — Encounter: Payer: Self-pay | Admitting: Physical Therapy

## 2021-07-27 DIAGNOSIS — R293 Abnormal posture: Secondary | ICD-10-CM | POA: Diagnosis not present

## 2021-07-27 DIAGNOSIS — R2689 Other abnormalities of gait and mobility: Secondary | ICD-10-CM | POA: Diagnosis not present

## 2021-07-27 DIAGNOSIS — M25572 Pain in left ankle and joints of left foot: Secondary | ICD-10-CM

## 2021-07-27 DIAGNOSIS — M5416 Radiculopathy, lumbar region: Secondary | ICD-10-CM

## 2021-07-27 NOTE — Therapy (Signed)
OUTPATIENT PHYSICAL THERAPY TREATMENT NOTE    Patient Name: Abigail Cohen MRN: [id number] DOB:10-24-1940, 81 y.o., female Today's Date: 07/20/21     PT End of Session - 07/27/21 1512     Visit Number 8    Number of Visits 16    Date for PT Re-Evaluation 08/05/21    Authorization Type Medicare    PT Start Time 1430    PT Stop Time 1510    PT Time Calculation (min) 40 min    Equipment Utilized During Treatment Gait belt    Activity Tolerance Patient tolerated treatment well    Behavior During Therapy WFL for tasks assessed/performed                Past Medical History:  Diagnosis Date   AAA (abdominal aortic aneurysm)    Allergy    Essential hypertension h   Family history of anesthesia complication    daughter gets nauseated   Hearing loss    wears hearing aids   Hypertension    Knee joint replacement status    Osteoarthritis    Osteopenia    Osteoporosis    Presence of intracervical pessary    Uterine prolapse    Wears glasses    Wears partial dentures    upper and lower partial   Past Surgical History:  Procedure Laterality Date   ABDOMINAL AORTIC ANEURYSM REPAIR N/A 08/28/2019   Procedure: ANEURYSM ABDOMINAL AORTIC REPAIR OPEN;  Surgeon: Angelia Mould, MD;  Location: [facility] OR;  Service: Vascular;  Laterality: N/A;   COLONOSCOPY     EYE SURGERY     cataracts removal   HERNIA REPAIR  07/07/12   Dr Aretha Parrot HERNIA REPAIR  07/07/2012   Procedure: HERNIA REPAIR INGUINAL ADULT;  Surgeon: Haywood Lasso, MD;  Location: [place];  Service: General;  Laterality: Left;  left inguinal area   REPLACEMENT TOTAL KNEE BILATERAL  2003   [place] EXTRACTION     Patient Active Problem List   Diagnosis Date Noted   nonobstructive CAD- follows with Dr. Audie Box cardiology  05/04/2021   Hyperlipidemia 10/30/2020   Essential hypertension 10/30/2020   Allergic rhinitis 10/30/2020   Osteoarthritis 10/30/2020    Abdominal aortic aneurysm (AAA) 08/28/2019   Osteoporosis 08/02/2016   Left inguinal hernia 06/20/2012    PCP: Marin Olp, MD  REFERRING PROVIDER: Marin Olp, MD  REFERRING DIAG: R sciatica, Gait abnormality.  THERAPY DIAG:  Radiculopathy, lumbar region  Other abnormalities of gait and mobility  Abnormal posture  Pain in left ankle and joints of left foot   SUBJECTIVE:  SUBJECTIVE STATEMENT: Pt with no new complaints.     PERTINENT HISTORY:  Bil TKA, Osteoporosis, Significant scoliosis, AAA, Neuropathy,   PAIN:  Are you having pain? Yes, pain much better.  VAS scale: 0/10 Pain location: Leg  Pain orientation: Right  PAIN TYPE: burning and radiating  Pain description: intermittent  Aggravating factors: increased activity, transfers, standing, walking  Relieving factors: rest, heat    VAS scale: 5/10 Pain location: shoulder Pain orientation:Right  PAIN TYPE: aching   Pain description: intermittent  Aggravating factors: reaching, elevation  Relieving factors: rest, heat     PATIENT GOALS  decreased pain    OBJECTIVE:   06/10/21:  LUMBARAROM/PROM Lumbar ROM: mod/significant limitation for extension and R SB, Flexion, mild limitation.   Hips: mild limitation for ER and Ext Knees: WFL   LE MMT:  MMT Right 06/10/21 Left 06/10/21  Hip flexion 4- 4-  Hip extension    Hip abduction 3 4-  Hip adduction    Hip internal rotation    Hip external rotation 4- 4-  Knee flexion 4+ 4+  Knee extension 4 4                   (Blank rows = not tested)  LUMBAR SPECIAL TESTS:  Neg SLR    GAIT: posture abnormalities from scoliosis, increased lateral trunk sway, decreased stability in hips and feet,  poor heel to toe propulsion in feet, poor push off.    TODAY'S  TREATMENT   07/27/21: Therapeutic Exercise: Aerobic: Supine: bridging x 15 ;  SLR x 10 bil;  Standing: March x 25, hip abd 2x10 bil;  step ups 6 in step x 10 bil; stairs up/down 5 Stairs, recipricol x 5, 1 HR;   Seated:  S/L: hip ab 2x10 bil;   Neuromuscular Re-education: AirEx: L/R and A/P weight shifts x 25 ea bil;  Step ups onto AirEx x 10 bil;  Toe taps on 6 in step x 20 no UE support; fwd/bwd stepping with weight shifts (pre-gait) x 20 bil;  Reaching outside of BOS x 8 bil;  Gait: 35 ft x 4 without AD, each with slow and medium speeds; Walking with SPC 35 ft x 4 with education for proper sequencing;   Manual Therapy:  Therapeutic Activity:  Self care:       07/23/21: Therapeutic Exercise: Aerobic: Supine: bridging x 15 ;  SLR x 10 bil;  Standing: March x 25, step ups 6 in step x 10 bil; Stairs, recipricol x 5, 1 HR;   Seated: LAQ 3 lb , 2x10 bil;  S/L:   Neuromuscular Re-education: AirEx: L/R and A/P weight shifts x 25 ea bil;  Step ups onto AirEx x 10 bil;  Gait:  35 ft x 3 without AD, each with slow and medium speeds; Walking with SPC 35 ft x 6 with education for proper sequencing; Bwd walking 25 ft x 4; Side stepping 25 ft x 4;  Manual Therapy: PROM for R shoulder, flexion and ER, manual pec stretch;  Therapeutic Activity:  Self care:      07/20/21: Therapeutic Exercise: Aerobic: Supine: bridging x 15 ; SLR x 10 bil;  Standing: March x 25,   Seated: LAQ 3 lb x 15 bil;  S/L: hip abd x 15 bil;   Neuromuscular Re-education: fwd/bwd stepping with weight shifts x 25 ea bil;  Bwd walking 25 ft x 6; Side stepping 25 ft x 4; Tandem stance 30 sec x 2 bil, with head turns x  10 bil;  Gait:  35 ft x 3 without AD, each with slow and medium speeds Manual Therapy: Therapeutic Activity:  Self care:       PATIENT EDUCATION:  Education details: reviewed HEP  Person educated: Patient Education method: Explanation, Demonstration, Tactile cues, Verbal cues, and  Handouts Education comprehension: verbalized understanding, returned demonstration, verbal cues required, tactile cues required, and needs further education   HOME EXERCISE PROGRAM: Access Code: [email or web address]   ASSESSMENT:  CLINICAL IMPRESSION: 07/27/21:   Pt with noted improvements in stability with performing balance exercises today. Still challenged with stability but improved ability and sway. She is most challenged with single leg phase of step ups, march, toe taps, due to weakness. Doing well with use and sequencing of SPC. Pt to benefit from continued progression of dynamic balance.   Objective impairments include Abnormal gait, decreased activity tolerance, decreased balance, decreased coordination, decreased knowledge of use of DME, decreased mobility, difficulty walking, decreased ROM, decreased strength, hypomobility, increased muscle spasms, impaired flexibility, improper body mechanics, postural dysfunction, and pain. These impairments are limiting patient from cleaning, community activity, shopping, and yard work. Personal factors including 1 comorbidity: osteoporosis and 1-2 comorbidities: scoliosis, pain at multiple sites,   also affecting patient's functional outcome. Patient will benefit from skilled PT to address above impairments and improve overall function.    REHAB POTENTIAL: Good  CLINICAL DECISION MAKING: Evolving/moderate complexity  EVALUATION COMPLEXITY: Moderate   GOALS: Goals reviewed with patient? Yes  SHORT TERM GOALS: SHORT TERM GOALS:  STG Name Target Date Goal status  1 Patient will be independent with initial HEP for back and foot pain  06/24/21 INITIAL                   LONG TERM GOALS:   LTG Name Target Date Goal status  1 Pt to be independent with final HEP.   08/05/21 INITIAL  2 Pt to report decreased pain in low back and LE to 0-3/10 with standing and activity.   08/05/21 INITIAL  3 Pt to report decreased pain in R foot to 0-2/10 with  standing and walking   08/05/21 INITIAL  4 Pt to demo improved ambulation pattern, with decreased lateral trunk sway,and improved balance, to be [facility], for improved efficiency.  08/05/21 INITIAL   5 Balance goal TBD.  08/05/21 INITIAL   6  Baseline:    7  Baseline:        PLAN: PT FREQUENCY: 1-2x/week  PT DURATION: 8 weeks  PLANNED INTERVENTIONS: Therapeutic exercises, Therapeutic activity, Neuro Muscular re-education, Balance training, Gait training, Patient/Family education, Joint mobilization, Stair training, Orthotic/Fit training, DME instructions, Dry Needling, Electrical stimulation, Spinal mobilization, Cryotherapy, Moist heat, Taping, Traction, Ultrasound, Ionotophoresis 4mg /ml Dexamethasone, and Manual therapy   Lyndee Hensen, PT, DPT 3:14 PM  07/27/21

## 2021-07-29 ENCOUNTER — Encounter: Payer: Medicare Other | Admitting: Physical Therapy

## 2021-07-31 ENCOUNTER — Encounter: Payer: Medicare Other | Admitting: Physical Therapy

## 2021-08-03 ENCOUNTER — Encounter: Payer: Self-pay | Admitting: Physical Therapy

## 2021-08-03 ENCOUNTER — Ambulatory Visit (INDEPENDENT_AMBULATORY_CARE_PROVIDER_SITE_OTHER): Payer: Medicare Other | Admitting: Physical Therapy

## 2021-08-03 ENCOUNTER — Other Ambulatory Visit: Payer: Self-pay

## 2021-08-03 DIAGNOSIS — R2689 Other abnormalities of gait and mobility: Secondary | ICD-10-CM | POA: Diagnosis not present

## 2021-08-03 DIAGNOSIS — R293 Abnormal posture: Secondary | ICD-10-CM

## 2021-08-03 DIAGNOSIS — M5416 Radiculopathy, lumbar region: Secondary | ICD-10-CM

## 2021-08-03 DIAGNOSIS — M25572 Pain in left ankle and joints of left foot: Secondary | ICD-10-CM

## 2021-08-03 NOTE — Therapy (Signed)
OUTPATIENT PHYSICAL THERAPY TREATMENT NOTE/ RE-CERT     Patient Name: Abigail Cohen MRN: [id number] DOB:29-Mar-1941, 81 y.o., female Today's Date: 07/20/21     PT End of Session - 08/03/21 1425     Visit Number 9    Number of Visits 20    Date for PT Re-Evaluation 09/14/21    Authorization Type Medicare   recert done at visit 9 .    PT Start Time 1430    PT Stop Time 1511    PT Time Calculation (min) 41 min    Equipment Utilized During Treatment Gait belt    Activity Tolerance Patient tolerated treatment well    Behavior During Therapy WFL for tasks assessed/performed                Past Medical History:  Diagnosis Date   AAA (abdominal aortic aneurysm)    Allergy    Essential hypertension h   Family history of anesthesia complication    daughter gets nauseated   Hearing loss    wears hearing aids   Hypertension    Knee joint replacement status    Osteoarthritis    Osteopenia    Osteoporosis    Presence of intracervical pessary    Uterine prolapse    Wears glasses    Wears partial dentures    upper and lower partial   Past Surgical History:  Procedure Laterality Date   ABDOMINAL AORTIC ANEURYSM REPAIR N/A 08/28/2019   Procedure: ANEURYSM ABDOMINAL AORTIC REPAIR OPEN;  Surgeon: Angelia Mould, MD;  Location: [facility] OR;  Service: Vascular;  Laterality: N/A;   COLONOSCOPY     EYE SURGERY     cataracts removal   HERNIA REPAIR  07/07/12   Dr Aretha Parrot HERNIA REPAIR  07/07/2012   Procedure: HERNIA REPAIR INGUINAL ADULT;  Surgeon: Haywood Lasso, MD;  Location: [place];  Service: General;  Laterality: Left;  left inguinal area   REPLACEMENT TOTAL KNEE BILATERAL  2003   [place] EXTRACTION     Patient Active Problem List   Diagnosis Date Noted   nonobstructive CAD- follows with Dr. Audie Box cardiology  05/04/2021   Hyperlipidemia 10/30/2020   Essential hypertension 10/30/2020   Allergic rhinitis  10/30/2020   Osteoarthritis 10/30/2020   Abdominal aortic aneurysm (AAA) 08/28/2019   Osteoporosis 08/02/2016   Left inguinal hernia 06/20/2012    PCP: Marin Olp, MD  REFERRING PROVIDER: Marin Olp, MD  REFERRING DIAG: R sciatica, Gait abnormality.  THERAPY DIAG:  Radiculopathy, lumbar region  Other abnormalities of gait and mobility  Abnormal posture  Pain in left ankle and joints of left foot   SUBJECTIVE:  SUBJECTIVE STATEMENT: Pt states mild increase in pain in back/down R LE  for a couple days. She has been stretching, which helps to relieve. Has been using cane, with new sequencing (with opp LE) and feels she is walking better and more comfortably.   PERTINENT HISTORY:  Bil TKA, Osteoporosis, Significant scoliosis, AAA, Neuropathy,   PAIN:  Are you having pain? Yes, pain much better.  VAS scale: 3/10 Pain location: Leg  Pain orientation: Right  PAIN TYPE: burning and radiating  Pain description: intermittent  Aggravating factors: increased activity, transfers, standing, walking  Relieving factors: rest, heat    VAS scale: 5/10 Pain location: shoulder Pain orientation:Right  PAIN TYPE: aching   Pain description: intermittent  Aggravating factors: reaching, elevation  Relieving factors: rest, heat    PATIENT GOALS  decreased pain    OBJECTIVE:   08/03/21: LUMBARAROM/PROM Lumbar ROM: mild limitation for extension and R SB. Flexion Rom improved , minimal deficit.    Hips: mild limitation for ER and Ext Knees: WFL   LE MMT:  MMT Right 06/10/21 Left 06/10/21 Right  08/03/21 Left  08/03/21  Hip flexion 4- 4- 4 4  Hip extension      Hip abduction 3 4- 4- 4-  Hip adduction      Hip internal rotation      Hip external rotation 4- 4- 4 4  Knee flexion 4+  4+ 4+ 4+  Knee extension 4 4 4+ 4+                           (Blank rows = not tested)  LUMBAR SPECIAL TESTS:  Neg SLR    GAIT: posture abnormalities from scoliosis, increased lateral trunk sway,much improved with use of SPC,   poor heel to toe propulsion in feet, poor push off.   Dynamic balance: fair +.   TODAY'S TREATMENT   1/75/10: Re-Cert:  Therapeutic Exercise: Aerobic: Supine:  bridging x 15 ;  SKTC, piriformis stretch 30 sec x 2 ea bil; LTR 5 sec x 10;  Standing: March x 25 , 1 Ue support;    Seated:  S/L:  hip ab 2x10 bil;   Neuromuscular Re-education:  Gait:  35 ft x 4 without AD;   Walking with SPC 35 ft x 8 with education for proper sequencing;  curb navigation outside with SPC, x 6;  up/down 3 steps ( 4 in) with SPC and no hand rail, x 3;   Manual Therapy: Long leg distraction for R lumbar traction x 2 min;        07/27/21: Therapeutic Exercise: Aerobic: Supine: bridging x 15 ;  SLR x 10 bil;  Standing: March x 25, hip abd 2x10 bil;  step ups 6 in step x 10 bil; stairs up/down 5 Stairs, recipricol x 5, 1 HR;   Seated:  S/L: hip ab 2x10 bil;   Neuromuscular Re-education: AirEx: L/R and A/P weight shifts x 25 ea bil;  Step ups onto AirEx x 10 bil;  Toe taps on 6 in step x 20 no UE support; fwd/bwd stepping with weight shifts (pre-gait) x 20 bil;  Reaching outside of BOS x 8 bil;  Gait: 35 ft x 4 without AD, each with slow and medium speeds; Walking with SPC 35 ft x 4 with education for proper sequencing;   Manual Therapy:  Therapeutic Activity:  Self care:       07/23/21: Therapeutic Exercise: Aerobic: Supine: bridging x 15 ;  SLR x 10 bil;  Standing: March x 25, step ups 6 in step x 10 bil; Stairs, recipricol x 5, 1 HR;   Seated: LAQ 3 lb , 2x10 bil;  S/L:   Neuromuscular Re-education: AirEx: L/R and A/P weight shifts x 25 ea bil;  Step ups onto AirEx x 10 bil;  Gait:  35 ft x 3 without AD, each with slow and medium speeds; Walking with SPC 35  ft x 6 with education for proper sequencing; Bwd walking 25 ft x 4; Side stepping 25 ft x 4;  Manual Therapy: PROM for R shoulder, flexion and ER, manual pec stretch;  Therapeutic Activity:  Self care:      PATIENT EDUCATION:  Education details: reviewed and updated HEP , reviewed POC  Person educated: Patient Education method: Explanation, Demonstration, Tactile cues, Verbal cues, and Handouts Education comprehension: verbalized understanding, returned demonstration, verbal cues required, tactile cues required, and needs further education   HOME EXERCISE PROGRAM: Access Code: [email or web address]   ASSESSMENT:  CLINICAL IMPRESSION: 08/03/21:   Pt has been seen for 9 visits. She is showing improvements in LE strength and with gait. She has improving gait mechanics, with less lateral trunk sway. She has improved mechanics and posture with use of SPC, and has improved after education and practice with this. She is still very limited with hip strength, and her postural asymmetry/scoliosis plays a role in gait mechanics. She is showing improvements in static and dynamic balance, with progression to unstable surfaces, but still challenged with this. She has decreased balance, and decreased safety with functional activity and stairs. Improved ability for curb navigation today after education and practice. She will benefit from continued care, to improve hip strength, gait mechanics, and dynamic balance, for improved safety, and to meet LTGs.     Objective impairments include Abnormal gait, decreased activity tolerance, decreased balance, decreased coordination, decreased knowledge of use of DME, decreased mobility, difficulty walking, decreased ROM, decreased strength, hypomobility, increased muscle spasms, impaired flexibility, improper body mechanics, postural dysfunction, and pain. These impairments are limiting patient from cleaning, community activity, shopping, and yard work. Personal factors including  1 comorbidity: osteoporosis and 1-2 comorbidities: scoliosis, pain at multiple sites,   also affecting patient's functional outcome. Patient will benefit from skilled PT to address above impairments and improve overall function.    REHAB POTENTIAL: Good  CLINICAL DECISION MAKING: Evolving/moderate complexity  EVALUATION COMPLEXITY: Moderate   GOALS: Goals reviewed with patient? Yes  SHORT TERM GOALS: SHORT TERM GOALS:  STG Name Target Date Goal status  1 Patient will be independent with initial HEP for back and foot pain  06/24/21 MET                   LONG TERM GOALS:   LTG Name Target Date Goal status  1 Pt to be independent with final HEP.   09/14/21 Partially Met  2 Pt to report decreased pain in low back and LE to 0-3/10 with standing and activity.   09/14/21 Partially Met  3 Pt to report decreased pain in R foot to 0-2/10 with standing and walking   08/05/21 MET   4 Pt to demo improved ambulation pattern, with decreased lateral trunk sway,and improved balance, to be [facility], for improved efficiency.  09/14/21 Partially Met   5 Pt to demo balance to be [facility] for dynamic activity and with ambulation, with SPC.   09/14/21 Initial   6  Pt to demo improved hip strength to at least  4/5 bil, to improve stability and gait.  09/14/21 Initial   7  Baseline:        PLAN: PT FREQUENCY: 1-2x/week  PT DURATION: 6 weeks  PLANNED INTERVENTIONS: Therapeutic exercises, Therapeutic activity, Neuro Muscular re-education, Balance training, Gait training, Patient/Family education, Joint mobilization, Stair training, Orthotic/Fit training, DME instructions, Dry Needling, Electrical stimulation, Spinal mobilization, Cryotherapy, Moist heat, Taping, Traction, Ultrasound, Ionotophoresis 62m/ml Dexamethasone, and Manual therapy   LLyndee Hensen PT, DPT 4:12 PM  08/03/21

## 2021-08-06 ENCOUNTER — Ambulatory Visit (INDEPENDENT_AMBULATORY_CARE_PROVIDER_SITE_OTHER): Payer: Medicare Other | Admitting: Physical Therapy

## 2021-08-06 ENCOUNTER — Ambulatory Visit (INDEPENDENT_AMBULATORY_CARE_PROVIDER_SITE_OTHER): Payer: Medicare Other | Admitting: Physician Assistant

## 2021-08-06 ENCOUNTER — Other Ambulatory Visit: Payer: Self-pay

## 2021-08-06 ENCOUNTER — Encounter: Payer: Self-pay | Admitting: Physician Assistant

## 2021-08-06 ENCOUNTER — Encounter: Payer: Self-pay | Admitting: Physical Therapy

## 2021-08-06 VITALS — BP 138/87 | HR 77 | Temp 97.7°F | Ht <= 58 in | Wt 130.4 lb

## 2021-08-06 DIAGNOSIS — M5431 Sciatica, right side: Secondary | ICD-10-CM | POA: Diagnosis not present

## 2021-08-06 DIAGNOSIS — M25572 Pain in left ankle and joints of left foot: Secondary | ICD-10-CM

## 2021-08-06 DIAGNOSIS — M5416 Radiculopathy, lumbar region: Secondary | ICD-10-CM

## 2021-08-06 DIAGNOSIS — R293 Abnormal posture: Secondary | ICD-10-CM | POA: Diagnosis not present

## 2021-08-06 DIAGNOSIS — R2689 Other abnormalities of gait and mobility: Secondary | ICD-10-CM | POA: Diagnosis not present

## 2021-08-06 NOTE — Therapy (Signed)
OUTPATIENT PHYSICAL THERAPY TREATMENT NOTE/ RE-CERT     Patient Name: Abigail Cohen MRN: [id number] DOB:08/18/1940, 81 y.o., female Today's Date: 07/20/21          Past Medical History:  Diagnosis Date   AAA (abdominal aortic aneurysm)    Allergy    Essential hypertension h   Family history of anesthesia complication    daughter gets nauseated   Hearing loss    wears hearing aids   Hypertension    Knee joint replacement status    Osteoarthritis    Osteopenia    Osteoporosis    Presence of intracervical pessary    Uterine prolapse    Wears glasses    Wears partial dentures    upper and lower partial   Past Surgical History:  Procedure Laterality Date   ABDOMINAL AORTIC ANEURYSM REPAIR N/A 08/28/2019   Procedure: ANEURYSM ABDOMINAL AORTIC REPAIR OPEN;  Surgeon: Angelia Mould, MD;  Location: [facility] OR;  Service: Vascular;  Laterality: N/A;   COLONOSCOPY     EYE SURGERY     cataracts removal   HERNIA REPAIR  07/07/12   Dr Aretha Parrot HERNIA REPAIR  07/07/2012   Procedure: HERNIA REPAIR INGUINAL ADULT;  Surgeon: Haywood Lasso, MD;  Location: [place];  Service: General;  Laterality: Left;  left inguinal area   REPLACEMENT TOTAL KNEE BILATERAL  2003   [place] EXTRACTION     Patient Active Problem List   Diagnosis Date Noted   nonobstructive CAD- follows with Dr. Audie Box cardiology  05/04/2021   Hyperlipidemia 10/30/2020   Essential hypertension 10/30/2020   Allergic rhinitis 10/30/2020   Osteoarthritis 10/30/2020   Abdominal aortic aneurysm (AAA) 08/28/2019   Osteoporosis 08/02/2016   Left inguinal hernia 06/20/2012    PCP: Marin Olp, MD  REFERRING PROVIDER: Marin Olp, MD  REFERRING DIAG: R sciatica, Gait abnormality.  THERAPY DIAG:  No diagnosis found.   SUBJECTIVE:                                                                                                                                                                                            SUBJECTIVE STATEMENT: Pt states increased pain in R LE in last few days. She saw PCP today for this. Recommended return to Ortho, for possible injection.  PERTINENT HISTORY:  Bil TKA, Osteoporosis, Significant scoliosis, AAA, Neuropathy,   PAIN:  Are you having pain? Yes, VAS scale: 3/10 Pain location: Leg  Pain orientation: Right  PAIN TYPE: burning and radiating  Pain description: intermittent  Aggravating factors: increased activity, transfers,  standing, walking  Relieving factors: rest, heat    VAS scale: 5/10 Pain location: shoulder Pain orientation:Right  PAIN TYPE: aching   Pain description: intermittent  Aggravating factors: reaching, elevation  Relieving factors: rest, heat    PATIENT GOALS  decreased pain    OBJECTIVE:   08/03/21: LUMBARAROM/PROM Lumbar ROM: mild limitation for extension and R SB. Flexion Rom improved , minimal deficit.    Hips: mild limitation for ER and Ext Knees: WFL   LE MMT:  MMT Right 06/10/21 Left 06/10/21 Right  08/03/21 Left  08/03/21  Hip flexion 4- 4- 4 4  Hip extension      Hip abduction 3 4- 4- 4-  Hip adduction      Hip internal rotation      Hip external rotation 4- 4- 4 4  Knee flexion 4+ 4+ 4+ 4+  Knee extension 4 4 4+ 4+                           (Blank rows = not tested)  LUMBAR SPECIAL TESTS:  Neg SLR    GAIT: posture abnormalities from scoliosis, increased lateral trunk sway,much improved with use of SPC,   poor heel to toe propulsion in feet, poor push off.   Dynamic balance: fair +.   TODAY'S TREATMENT   08/06/21: Therapeutic Exercise: Aerobic:  Supine:  bridging 2x 10  ;  SLR x 10 bil;  SKTC30 sec x 3;   Clams blue TB x 20; Education on decompression position in hooklying;  Standing: March x 25 ,1 Ue support;   Seated:  S/L:    Neuromuscular Re-education: Walking with SPC 35 ft x 8 with education for proper sequencing;  AirEx:  L/R and A/P weight shifts x 25 each; step ups onto AirEx x 10 bil;   Manual Therapy: Long leg distraction for R lumbar traction x 3  min;       0/24/09: Re-Cert:  Therapeutic Exercise: Aerobic: Supine:  bridging x 15 ;  SKTC, piriformis stretch 30 sec x 2 ea bil; LTR 5 sec x 10;  Standing: March x 25 , 1 Ue support;    Seated:  S/L:  hip ab 2x10 bil;   Neuromuscular Re-education:  Gait:  35 ft x 4 without AD;   Walking with SPC 35 ft x 8 with education for proper sequencing;  curb navigation outside with SPC, x 6;  up/down 3 steps ( 4 in) with SPC and no hand rail, x 3;   Manual Therapy: Long leg distraction for R lumbar traction x 2 min;        07/27/21: Therapeutic Exercise: Aerobic: Supine: bridging x 15 ;  SLR x 10 bil;  Standing: March x 25, hip abd 2x10 bil;  step ups 6 in step x 10 bil; stairs up/down 5 Stairs, recipricol x 5, 1 HR;   Seated:  S/L: hip ab 2x10 bil;   Neuromuscular Re-education: AirEx: L/R and A/P weight shifts x 25 ea bil;  Step ups onto AirEx x 10 bil;  Toe taps on 6 in step x 20 no UE support; fwd/bwd stepping with weight shifts (pre-gait) x 20 bil;  Reaching outside of BOS x 8 bil;  Gait: 35 ft x 4 without AD, each with slow and medium speeds; Walking with SPC 35 ft x 4 with education for proper sequencing;   Manual Therapy:  Therapeutic Activity:  Self care:      PATIENT EDUCATION:  Education details: reviewed  HEP  Person educated: Patient Education method: Explanation, Demonstration, Tactile cues, Verbal cues, and Handouts Education comprehension: verbalized understanding, returned demonstration, verbal cues required, tactile cues required, and needs further education   HOME EXERCISE PROGRAM: Access Code: [email or web address]   ASSESSMENT:  CLINICAL IMPRESSION: 08/06/21: Pt has had increased lumbar radiculopathy pain in the last couple days, a bit better today. Discussed decompression position and use of cane for decreased pain during these  times. She will call ortho and get f/u appt. Pt able to perform strengthening exercises today without increased pain. Decreased standing ther ex today, due to increased pain this week. Pt to benefit from continued strengthening.    Objective impairments include Abnormal gait, decreased activity tolerance, decreased balance, decreased coordination, decreased knowledge of use of DME, decreased mobility, difficulty walking, decreased ROM, decreased strength, hypomobility, increased muscle spasms, impaired flexibility, improper body mechanics, postural dysfunction, and pain. These impairments are limiting patient from cleaning, community activity, shopping, and yard work. Personal factors including 1 comorbidity: osteoporosis and 1-2 comorbidities: scoliosis, pain at multiple sites,   also affecting patient's functional outcome. Patient will benefit from skilled PT to address above impairments and improve overall function.    REHAB POTENTIAL: Good  CLINICAL DECISION MAKING: Evolving/moderate complexity  EVALUATION COMPLEXITY: Moderate   GOALS: Goals reviewed with patient? Yes  SHORT TERM GOALS: SHORT TERM GOALS:  STG Name Target Date Goal status  1 Patient will be independent with initial HEP for back and foot pain  06/24/21 MET                   LONG TERM GOALS:   LTG Name Target Date Goal status  1 Pt to be independent with final HEP.   09/14/21 Partially Met  2 Pt to report decreased pain in low back and LE to 0-3/10 with standing and activity.   09/14/21 Partially Met  3 Pt to report decreased pain in R foot to 0-2/10 with standing and walking   08/05/21 MET   4 Pt to demo improved ambulation pattern, with decreased lateral trunk sway,and improved balance, to be [facility], for improved efficiency.  09/14/21 Partially Met   5 Pt to demo balance to be [facility] for dynamic activity and with ambulation, with SPC.   09/14/21 Initial   6  Pt to demo improved hip strength to at least 4/5 bil, to  improve stability and gait.  09/14/21 Initial   7  Baseline:        PLAN: PT FREQUENCY: 1-2x/week  PT DURATION: 6 weeks  PLANNED INTERVENTIONS: Therapeutic exercises, Therapeutic activity, Neuro Muscular re-education, Balance training, Gait training, Patient/Family education, Joint mobilization, Stair training, Orthotic/Fit training, DME instructions, Dry Needling, Electrical stimulation, Spinal mobilization, Cryotherapy, Moist heat, Taping, Traction, Ultrasound, Ionotophoresis 55m/ml Dexamethasone, and Manual therapy   LLyndee Hensen PT, DPT 2:01 PM  08/06/21

## 2021-08-06 NOTE — Progress Notes (Signed)
Subjective:    Patient ID: Abigail Cohen, female    DOB: 1940-11-21, 81 y.o.   MRN: [id number]  Chief Complaint  Patient presents with   Sciatica    Right leg     HPI Patient is in today for f/up on sciatica. She has had nine sessions of PT since initial visit on 06/05/21. States that she was starting to see some improvement, but she is having same pain flare up again in the last few days. She has been taking Advil to help with the pain.  Negative for any fevers, saddle anesthesia, foot-drop, incontinence of urine or stool, hx of cancer, and no recent injury.    Past Medical History:  Diagnosis Date   AAA (abdominal aortic aneurysm)    Allergy    Essential hypertension h   Family history of anesthesia complication    daughter gets nauseated   Hearing loss    wears hearing aids   Hypertension    Knee joint replacement status    Osteoarthritis    Osteopenia    Osteoporosis    Presence of intracervical pessary    Uterine prolapse    Wears glasses    Wears partial dentures    upper and lower partial    Past Surgical History:  Procedure Laterality Date   ABDOMINAL AORTIC ANEURYSM REPAIR N/A 08/28/2019   Procedure: ANEURYSM ABDOMINAL AORTIC REPAIR OPEN;  Surgeon: Angelia Mould, MD;  Location: [facility] OR;  Service: Vascular;  Laterality: N/A;   COLONOSCOPY     EYE SURGERY     cataracts removal   HERNIA REPAIR  07/07/12   Dr Aretha Parrot HERNIA REPAIR  07/07/2012   Procedure: HERNIA REPAIR INGUINAL ADULT;  Surgeon: Haywood Lasso, MD;  Location: [place];  Service: General;  Laterality: Left;  left inguinal area   REPLACEMENT TOTAL KNEE BILATERAL  2003   TONSILLECTOMY  1945   WISDOM TOOTH EXTRACTION      Family History  Problem Relation Age of Onset   Heart disease Mother    Heart attack Mother        34s   Arrhythmia Mother    Heart disease Father        33s   Heart attack Father    Cancer Sister        endometrial    Social  History   Tobacco Use   Smoking status: Former    Years: 35.00    Types: Cigarettes    Start date: 07/05/2004    Quit date: 06/30/2005    Years since quitting: 16.1   Smokeless tobacco: Never  Vaping Use   Vaping Use: Never used  Substance Use Topics   Alcohol use: Yes    Alcohol/week: 7.0 standard drinks    Types: 7 Glasses of wine per week   Drug use: No     Allergies  Allergen Reactions   Penicillins     Blood shot eyes, felt poorly.  Tolerates amoxicillin with dentist    Review of Systems NEGATIVE UNLESS OTHERWISE INDICATED IN HPI      Objective:     BP 138/87    Pulse 77    Temp 97.7 F (36.5 C)    Ht 4\' 10"  (1.473 m)    Wt 130 lb 6.1 oz (59.1 kg)    LMP  (LMP Unknown)    SpO2 98%    BMI 27.25 kg/m   Wt Readings from Last 3 Encounters:  08/06/21 130 lb 6.1 oz (59.1 kg)  07/13/21 131 lb (59.4 kg)  06/05/21 131 lb 3.2 oz (59.5 kg)    BP Readings from Last 3 Encounters:  08/06/21 138/87  07/13/21 132/80  06/05/21 137/82     Physical Exam Vitals and nursing note reviewed.  Constitutional:      Appearance: Normal appearance.     Comments: Using a cane  Musculoskeletal:     Thoracic back: Scoliosis present.     Lumbar back: No spasms or tenderness. Normal range of motion. Positive right straight leg raise test. Negative left straight leg raise test. Scoliosis present.     Comments: She can push and pull against me in her lower extremities without difficulty.  No foot drop noted.  Neurological:     General: No focal deficit present.     Mental Status: She is alert and oriented to person, place, and time.     Motor: No weakness.     Gait: Gait normal.       Assessment & Plan:   Problem List Items Addressed This Visit   None Visit Diagnoses     Right sided sciatica    -  Primary   Radiculopathy, lumbar region           1. Right sided sciatica 2. Radiculopathy, lumbar region -Pt states she is established with [facility] and has seen  them previously for her back; records requested today. Advised that she call them for f/up given this has been going on for the last few months now. -She will continue working with Ander Purpura in PT. -She will continue Advil prn with food. -She knows to present to the ER in the event any red flags develop with her pain.   This note was prepared with assistance of Systems analyst. Occasional wrong-word or sound-a-like substitutions may have occurred due to the inherent limitations of voice recognition software.  Time Spent: 29 minutes of total time was spent on the date of the encounter performing the following actions: chart review prior to seeing the patient, obtaining history, performing a medically necessary exam, counseling on the treatment plan, placing orders, and documenting in our EHR.    Cyann Venti M Lakashia Collison, PA-C

## 2021-08-06 NOTE — Patient Instructions (Addendum)
-  Good to see you again today. -Please call [place] for follow-up with them given persistent sciatica x 2 months now. -Continue working with Ander Purpura in PT.  -Advil prn is fine, call back if needing another medrol dose pak prior to ortho appointment

## 2021-08-10 ENCOUNTER — Encounter: Payer: Medicare Other | Admitting: Physical Therapy

## 2021-08-11 DIAGNOSIS — M5441 Lumbago with sciatica, right side: Secondary | ICD-10-CM | POA: Diagnosis not present

## 2021-08-13 ENCOUNTER — Ambulatory Visit (INDEPENDENT_AMBULATORY_CARE_PROVIDER_SITE_OTHER): Payer: Medicare Other | Admitting: Internal Medicine

## 2021-08-13 ENCOUNTER — Encounter: Payer: Self-pay | Admitting: Internal Medicine

## 2021-08-13 ENCOUNTER — Ambulatory Visit (INDEPENDENT_AMBULATORY_CARE_PROVIDER_SITE_OTHER): Payer: Medicare Other | Admitting: Physical Therapy

## 2021-08-13 ENCOUNTER — Other Ambulatory Visit: Payer: Self-pay

## 2021-08-13 VITALS — BP 140/92 | HR 96 | Ht <= 58 in | Wt 131.4 lb

## 2021-08-13 DIAGNOSIS — M810 Age-related osteoporosis without current pathological fracture: Secondary | ICD-10-CM

## 2021-08-13 DIAGNOSIS — M25572 Pain in left ankle and joints of left foot: Secondary | ICD-10-CM

## 2021-08-13 DIAGNOSIS — M5416 Radiculopathy, lumbar region: Secondary | ICD-10-CM

## 2021-08-13 DIAGNOSIS — R293 Abnormal posture: Secondary | ICD-10-CM

## 2021-08-13 DIAGNOSIS — R2689 Other abnormalities of gait and mobility: Secondary | ICD-10-CM

## 2021-08-13 DIAGNOSIS — I2510 Atherosclerotic heart disease of native coronary artery without angina pectoris: Secondary | ICD-10-CM

## 2021-08-13 LAB — BASIC METABOLIC PANEL
BUN: 35 mg/dL — ABNORMAL HIGH (ref 6–23)
CO2: 31 mEq/L (ref 19–32)
Calcium: 9.9 mg/dL (ref 8.4–10.5)
Chloride: 101 mEq/L (ref 96–112)
Creatinine, Ser: 0.73 mg/dL (ref 0.40–1.20)
GFR: 76.26 mL/min (ref >60.00)
Glucose, Bld: 116 mg/dL — ABNORMAL HIGH (ref 70–99)
Potassium: 5.2 mEq/L — ABNORMAL HIGH (ref 3.5–5.1)
Sodium: 137 mEq/L (ref 135–145)

## 2021-08-13 LAB — VITAMIN D 25 HYDROXY (VIT D DEFICIENCY, FRACTURES): VITD: 39.41 ng/mL (ref 30.00–100.00)

## 2021-08-13 NOTE — Therapy (Signed)
OUTPATIENT PHYSICAL THERAPY TREATMENT NOTE    Patient Name: Abigail Cohen MRN: [id number] DOB:05-18-1941, 81 y.o., female Today's Date: 07/20/21     PT End of Session - 08/14/21 1049     Visit Number 11    Number of Visits 20    Date for PT Re-Evaluation 09/14/21    Authorization Type Medicare   recert done at visit 9 .    PT Start Time 1430    PT Stop Time 1508    PT Time Calculation (min) 38 min    Equipment Utilized During Treatment Gait belt    Activity Tolerance Patient tolerated treatment well    Behavior During Therapy WFL for tasks assessed/performed                 Past Medical History:  Diagnosis Date   AAA (abdominal aortic aneurysm)    Allergy    Essential hypertension h   Family history of anesthesia complication    daughter gets nauseated   Hearing loss    wears hearing aids   Hypertension    Knee joint replacement status    Osteoarthritis    Osteopenia    Osteoporosis    Presence of intracervical pessary    Uterine prolapse    Wears glasses    Wears partial dentures    upper and lower partial   Past Surgical History:  Procedure Laterality Date   ABDOMINAL AORTIC ANEURYSM REPAIR N/A 08/28/2019   Procedure: ANEURYSM ABDOMINAL AORTIC REPAIR OPEN;  Surgeon: Angelia Mould, MD;  Location: [facility] OR;  Service: Vascular;  Laterality: N/A;   COLONOSCOPY     EYE SURGERY     cataracts removal   HERNIA REPAIR  07/07/12   Dr Aretha Parrot HERNIA REPAIR  07/07/2012   Procedure: HERNIA REPAIR INGUINAL ADULT;  Surgeon: Haywood Lasso, MD;  Location: [place] [facility];  Service: General;  Laterality: Left;  left inguinal area   REPLACEMENT TOTAL KNEE BILATERAL  2003   [place] EXTRACTION     Patient Active Problem List   Diagnosis Date Noted   nonobstructive CAD- follows with Dr. Audie Box cardiology  05/04/2021   Hyperlipidemia 10/30/2020   Essential hypertension 10/30/2020   Allergic rhinitis 10/30/2020    Osteoarthritis 10/30/2020   Abdominal aortic aneurysm (AAA) 08/28/2019   Osteoporosis 08/02/2016   Left inguinal hernia 06/20/2012    PCP: Marin Olp, MD  REFERRING PROVIDER: Marin Olp, MD  REFERRING DIAG: R sciatica, Gait abnormality.  THERAPY DIAG:  Radiculopathy, lumbar region  Other abnormalities of gait and mobility  Abnormal posture  Pain in left ankle and joints of left foot   SUBJECTIVE:  SUBJECTIVE STATEMENT: Pt had recent injection 2 days ago, and also started round of prednisone. States that she hasn't noticed much improvement in pain in leg. Feels slightly more off balance today, thinks it is from prednisone.   PERTINENT HISTORY:  Bil TKA, Osteoporosis, Significant scoliosis, AAA, Neuropathy,   PAIN:  Are you having pain? Yes, VAS scale: 5/10 Pain location: Leg  Pain orientation: Right  PAIN TYPE: burning and radiating  Pain description: intermittent  Aggravating factors: increased activity, transfers, standing, walking  Relieving factors: rest, heat    VAS scale: 5/10 Pain location: shoulder Pain orientation:Right  PAIN TYPE: aching   Pain description: intermittent  Aggravating factors: reaching, elevation  Relieving factors: rest, heat    PATIENT GOALS  decreased pain    OBJECTIVE:   08/03/21: LUMBARAROM/PROM Lumbar ROM: mild limitation for extension and R SB. Flexion Rom improved , minimal deficit.    Hips: mild limitation for ER and Ext Knees: WFL   LE MMT:  MMT Right 06/10/21 Left 06/10/21 Right  08/03/21 Left  08/03/21  Hip flexion 4- 4- 4 4  Hip extension      Hip abduction 3 4- 4- 4-  Hip adduction      Hip internal rotation      Hip external rotation 4- 4- 4 4  Knee flexion 4+ 4+ 4+ 4+  Knee extension 4 4 4+ 4+                            (Blank rows = not tested)  LUMBAR SPECIAL TESTS:  Neg SLR    GAIT: posture abnormalities from scoliosis, increased lateral trunk sway,much improved with use of SPC,   poor heel to toe propulsion in feet, poor push off.   Dynamic balance: fair +.   TODAY'S TREATMENT   08/13/21: Therapeutic Exercise: Aerobic:  Supine:  pelvic tilts x 15; bridging 2x 10  ;  Clams blue TB x 20;  Standing: March x 25 ,1 Ue support;  Hip abd x 20 on R;  Seated:  S/L:  clam x 20 bil;   Neuromuscular Re-education:  Manual Therapy: Long leg distraction on L, for  lumbar traction- painful to do on R;   S/L R decompression      08/06/21: Therapeutic Exercise: Aerobic:  Supine:  bridging 2x 10  ;  SLR x 10 bil;  SKTC30 sec x 3;   Clams blue TB x 20; Education on decompression position in hooklying;  Standing: March x 25 ,1 Ue support;   Seated:  S/L:    Neuromuscular Re-education: Walking with SPC 35 ft x 8 with education for proper sequencing;  AirEx: L/R and A/P weight shifts x 25 each; step ups onto AirEx x 10 bil;   Manual Therapy: Long leg distraction for R lumbar traction x 3  min;     5/64/33: Re-Cert:  Therapeutic Exercise: Aerobic: Supine:  bridging x 15 ;  SKTC, piriformis stretch 30 sec x 2 ea bil; LTR 5 sec x 10;  Standing: March x 25 , 1 Ue support;    Seated:  S/L:  hip ab 2x10 bil;   Neuromuscular Re-education:  Gait:  35 ft x 4 without AD;   Walking with SPC 35 ft x 8 with education for proper sequencing;  curb navigation outside with SPC, x 6;  up/down 3 steps ( 4 in) with SPC and no hand rail, x 3;   Manual Therapy: Long leg distraction  for R lumbar traction x 2 min;      PATIENT EDUCATION:  Education details: reviewed  HEP  Person educated: Patient Education method: Explanation, Demonstration, Tactile cues, Verbal cues, and Handouts Education comprehension: verbalized understanding, returned demonstration, verbal cues required, tactile cues required, and  needs further education   HOME EXERCISE PROGRAM: Access Code: [email or web address]   ASSESSMENT:  CLINICAL IMPRESSION: 08/13/21:   Pt able to perform some ther ex today without pain. Decreased standing activity done today, due to pain in back. She just started prednisone and is not feeling her best today. Pt did have good response with back pain to last round of prednisone. Hopefully she will be having less pain next week and will be able to continue strength/balance progression. Focus today for decompression and pain free strengthening. Discussed continuing activity at home,as able.    Objective impairments include Abnormal gait, decreased activity tolerance, decreased balance, decreased coordination, decreased knowledge of use of DME, decreased mobility, difficulty walking, decreased ROM, decreased strength, hypomobility, increased muscle spasms, impaired flexibility, improper body mechanics, postural dysfunction, and pain. These impairments are limiting patient from cleaning, community activity, shopping, and yard work. Personal factors including 1 comorbidity: osteoporosis and 1-2 comorbidities: scoliosis, pain at multiple sites,   also affecting patient's functional outcome. Patient will benefit from skilled PT to address above impairments and improve overall function.    REHAB POTENTIAL: Good  CLINICAL DECISION MAKING: Evolving/moderate complexity  EVALUATION COMPLEXITY: Moderate   GOALS: Goals reviewed with patient? Yes  SHORT TERM GOALS: SHORT TERM GOALS:  STG Name Target Date Goal status  1 Patient will be independent with initial HEP for back and foot pain  06/24/21 MET                   LONG TERM GOALS:   LTG Name Target Date Goal status  1 Pt to be independent with final HEP.   09/14/21 Partially Met  2 Pt to report decreased pain in low back and LE to 0-3/10 with standing and activity.   09/14/21 Partially Met  3 Pt to report decreased pain in R foot to 0-2/10 with standing  and walking   08/05/21 MET   4 Pt to demo improved ambulation pattern, with decreased lateral trunk sway,and improved balance, to be [facility], for improved efficiency.  09/14/21 Partially Met   5 Pt to demo balance to be [facility] for dynamic activity and with ambulation, with SPC.   09/14/21 Initial   6  Pt to demo improved hip strength to at least 4/5 bil, to improve stability and gait.  09/14/21 Initial   7  Baseline:        PLAN: PT FREQUENCY: 1-2x/week  PT DURATION: 6 weeks  PLANNED INTERVENTIONS: Therapeutic exercises, Therapeutic activity, Neuro Muscular re-education, Balance training, Gait training, Patient/Family education, Joint mobilization, Stair training, Orthotic/Fit training, DME instructions, Dry Needling, Electrical stimulation, Spinal mobilization, Cryotherapy, Moist heat, Taping, Traction, Ultrasound, Ionotophoresis 62m/ml Dexamethasone, and Manual therapy   LLyndee Hensen PT, DPT 10:50 AM  08/14/21

## 2021-08-13 NOTE — Patient Instructions (Signed)
Please stop at the lab.  Please come back for a follow-up appointment in 1 year.  

## 2021-08-13 NOTE — Progress Notes (Signed)
Patient ID: Abigail Cohen, female   DOB: 1940-08-03, 81 y.o.   MRN: [id number]   This visit occurred during the SARS-CoV-2 public health emergency.  Safety protocols were in place, including screening questions prior to the visit, additional usage of staff PPE, and extensive cleaning of exam room while observing appropriate contact time as indicated for disinfecting solutions.   HPI  Abigail Cohen is a 81 y.o.-year-old very pleasant female, returning for follow-up for osteoporosis.  Last visit 1 year ago.  Interim history: No falls or fractures since last visit. She started to have sciatica in 05/2021.She is in PT. She saw ortho  >> had 2 steroid inj's >> did not work. Also, on Prednisone. She has a tooth extraction last year.   She was diagnosed with osteoporosis in 2015.  Reviewed previous DXA scan reports: Date L1-L4 T score FN T score 33% distal Radius UD radius  [facility]23/2020 [facility] n/a RFN: -1.4 LFN: -0.9 -3.3 -2.2  06/22/2018 ([facility]) n/a RFN: -1.4 LFN: -1.4 -3.1 -2.2  05/10/2016 ([facility]) n/a RFN: -2.0 LFN: -1.5 -3.1 -2.8  05/09/2014 ([facility]) n/a RFN: n/a LFN: -1.7 -2.6 -1.3  04/05/2012 ([facility]) n/a RFN: -1.7 -2.2   11/02/2006 ([facility]) n/a RFN: -1.6 -2.1   10/04/2001 ([facility])  -0.5 LFN: -0.8 n/a    No fractures since last visit.  She denies dizziness, vertigo, orthostasis, poor vision.  Reviewed osteoporotic treatment: - Fosamax 2015-06/2016 - Prolia: 08/2016 02/2017 09/2017 03/2018 09/13/2018 03/20/2019 10/02/2019 04/10/2020 10/10/2020 04/14/2021   She denies: Jaw, hip, thigh pain.  No history of vitamin D deficiency: Lab Results  Component Value Date   VD25OH 54.4 08/07/2020   VD25OH 40.21 08/08/2019   VD25OH 47.97 08/02/2018   VD25OH 48.18 08/02/2017   VD25OH 41.40 08/04/2016  11/24/2015: vit D 49.2 06/03/2014: Vit D 39.7  She continues on: - vitamin D 400 units daily - calcium citrate 250-500 mg daily - multivitamin  daily  She was previously doing weightbearing exercises - [facility] on [address] >> stopped before last visit.  She was then exercising at home on her glider, but at last visit she had back pain and could not exercise >> now restarted exercise as back pain is resolved after the AAA surgery.  Menopause was in her 76s.  No family history of osteoporosis.  No history of kidney stones.  She had hypocalcemia after our last visit, but this was in the setting of aortic aneurysm repair surgery. Lab Results  Component Value Date   PTH 29 08/04/2016   CALCIUM 10.0 09/22/2020   CALCIUM 9.2 08/07/2020   CALCIUM 7.1 (L) 08/31/2019   CALCIUM 7.1 (L) 08/29/2019   CALCIUM 7.9 (L) 08/28/2019   CALCIUM 9.1 08/27/2019   CALCIUM 9.6 08/02/2018   CALCIUM 9.3 08/02/2017   CALCIUM 9.1 08/04/2016   No history of thyrotoxicosis: Lab Results  Component Value Date   TSH 1.19 09/22/2020   TSH 0.54 08/04/2016   No history of CKD. Last BUN/Cr: Lab Results  Component Value Date   BUN 26 (A) 09/22/2020   CREATININE 0.7 09/22/2020   She had one steroid injection for hip bursitis in the past, but not recently.  She has peripheral neuropathy which unclear etiology.  At our first visit, I advised her to start alpha-lipoic acid 600 mg twice a day,  and also benfothiamine.    ROS: + see HPI Neurological: no tremors/+ numbness/+ tingling/no dizziness  I reviewed pt's medications, allergies, PMH, social hx, family hx,  and changes were documented in the history of present illness. Otherwise, unchanged from my initial visit note.  Past Medical History:  Diagnosis Date   AAA (abdominal aortic aneurysm)    Allergy    Essential hypertension h   Family history of anesthesia complication    daughter gets nauseated   Hearing loss    wears hearing aids   Hypertension    Knee joint replacement status    Osteoarthritis    Osteopenia    Osteoporosis    Presence of intracervical  pessary    Uterine prolapse    Wears glasses    Wears partial dentures    upper and lower partial   Past Surgical History:  Procedure Laterality Date   ABDOMINAL AORTIC ANEURYSM REPAIR N/A 08/28/2019   Procedure: ANEURYSM ABDOMINAL AORTIC REPAIR OPEN;  Surgeon: Angelia Mould, MD;  Location: [facility] OR;  Service: Vascular;  Laterality: N/A;   COLONOSCOPY     EYE SURGERY     cataracts removal   HERNIA REPAIR  07/07/12   Dr Aretha Parrot HERNIA REPAIR  07/07/2012   Procedure: HERNIA REPAIR INGUINAL ADULT;  Surgeon: Haywood Lasso, MD;  Location: [place];  Service: General;  Laterality: Left;  left inguinal area   REPLACEMENT TOTAL KNEE BILATERAL  2003   TONSILLECTOMY  1945   WISDOM TOOTH EXTRACTION     Social History   Social History   Marital status: Divorced    Spouse name: N/A   Number of children: N/A   Years of education: N/A   Occupational History   Not on file.   Social History Main Topics   Smoking status: Former Smoker    Start date: 07/05/2004    Quit date: 06/30/2005   Smokeless tobacco: Never Used   Alcohol use Yes     Comment: social   Drug use: No   Current Outpatient Medications  Medication Sig Dispense Refill   acetaminophen (TYLENOL) 500 MG tablet Take 500 mg by mouth every 6 (six) hours as needed (ARTHRITIS PAIN.).     Alpha Lipoic Acid 200 MG CAPS See admin instructions.     aspirin EC 81 MG tablet Take 1 tablet (81 mg total) by mouth daily.     BENFOTIAMINE PO Take 300 mg by mouth daily.     CALCIUM CITRATE PO Take 1-2 tablets by mouth at bedtime.     chlorhexidine (PERIDEX) 0.12 % solution 15 mLs 2 (two) times daily.     Cholecalciferol (VITAMIN D3) 10 MCG (400 UNIT) tablet Take 400 Units by mouth daily.     denosumab (PROLIA) 60 MG/ML SOSY injection Inject 60 mg into the skin every 6 (six) months.     estradiol (ESTRACE) 0.1 MG/GM vaginal cream Place 1 Applicatorful vaginally 2 (two) times a week.      ibuprofen (ADVIL) 200  MG tablet 2 tabs     Magnesium 250 MG TABS Take 250 mg by mouth daily.      metoprolol succinate (TOPROL-XL) 25 MG 24 hr tablet Take 1 tablet (25 mg total) by mouth daily. 90 tablet 3   Multiple Vitamin (MULTIVITAMIN WITH MINERALS) TABS tablet Take 1 tablet by mouth daily.     polyethylene glycol (MIRALAX / GLYCOLAX) 17 g packet 1/2 packet mixed with 8 ounces of fluid     rosuvastatin (CRESTOR) 10 MG tablet TAKE 1 TABLET(10 MG) BY MOUTH DAILY 90 tablet 3   No current facility-administered medications for this visit.  Allergies  Allergen Reactions   Penicillins     Blood shot eyes, felt poorly.  Tolerates amoxicillin with dentist   Family History  Problem Relation Age of Onset   Heart disease Mother    Heart attack Mother        11s   Arrhythmia Mother    Heart disease Father        16s   Heart attack Father    Cancer Sister        endometrial   PE: BP (!) 140/92 (BP Location: Right Arm, Patient Position: Sitting, Cuff Size: Normal)    Pulse 96    Ht 4\' 10"  (1.473 m)    Wt 131 lb 6.4 oz (59.6 kg)    LMP  (LMP Unknown)    SpO2 98%    BMI 27.46 kg/m  Wt Readings from Last 3 Encounters:  08/13/21 131 lb 6.4 oz (59.6 kg)  08/06/21 130 lb 6.1 oz (59.1 kg)  07/13/21 131 lb (59.4 kg)   Constitutional: normal weight, in NAD, + antalgic gait - with cane Eyes: PERRLA, EOMI, no exophthalmos ENT: moist mucous membranes, no thyromegaly, no cervical lymphadenopathy Cardiovascular: RRR, No MRG Respiratory: CTA B Musculoskeletal: no deformities, strength intact in all 4 Skin: moist, warm, no rashes Neurological: no tremor with outstretched hands, DTR normal in all 4  Assessment: 1. Osteoporosis  Plan: 1. Osteoporosis -Likely age-related, postmenopausal -Reviewed her latest bone density report from 06/23/2020: the L spine could not be analyzed but there was significant T-score increase at the level of the left femoral neck and stability in the 33% distal, we will try to stop radius  and the right femoral neck. This was a good result since either stability or increase in BMD are desirable outcomes while on Prolia -We will continue Prolia, for which she had 10 injections, last 04/14/2021. She is wondering if she can get these in PCPs office as this is more convenient to her. I advised her that that this is definitely an option - will d/w him. -She tolerates Prolia well, without jaw/thigh/hip pain.  No dental work in progress or upcoming. She had a dental extraction >> healed -She is on a good dose of calcium and vitamin D daily.  She tried to take 1000-1200 mg of calcium from the diet +supplements.  She is also on vitamin D. -No falls or fractures since last visit.  She is aware of fall precautions.  She walks outside with a 3 pronged cane -She could not start weightbearing exercises 2/2 back pain and also aortic aneurysm repair surgery before last visit. She  restarted to use the glider after her back pain resolved after surgery.  I advised her to combine this with weightbearing exercises at last visit. At this visit, she is in PT  - discussed about starting weight bearing exercises  - discuss with her physical therapist about appropraate exercises for her -Reviewed latest vitamin D level and this was normal, at 54.4 at last visit -At today's visit we will check another vitamin D level and BMP -She is due for another DXA scan in 06/2022 -I will see her back in 1 year  Component     Latest Ref Rng & Units 08/13/2021          Sodium     135 - 145 mEq/L 137  Potassium     3.5 - 5.1 mEq/L 5.2 No hemolysis seen (H)  Chloride     96 - 112 mEq/L 101  CO2     19 - 32 mEq/L 31  Glucose     70 - 99 mg/dL 116 (H)  BUN     6 - 23 mg/dL 35 (H)  Creatinine     0.40 - 1.20 mg/dL 0.73  GFR     >60.00 mL/min 76.26  Calcium     8.4 - 10.5 mg/dL 9.9  VITD     30.00 - 100.00 ng/mL 39.41    Philemon Kingdom, MD PhD [facility] Endocrinology

## 2021-08-14 ENCOUNTER — Encounter: Payer: Self-pay | Admitting: Physical Therapy

## 2021-08-17 ENCOUNTER — Ambulatory Visit (INDEPENDENT_AMBULATORY_CARE_PROVIDER_SITE_OTHER): Payer: Medicare Other | Admitting: Physical Therapy

## 2021-08-17 ENCOUNTER — Other Ambulatory Visit: Payer: Self-pay

## 2021-08-17 ENCOUNTER — Encounter: Payer: Self-pay | Admitting: Physical Therapy

## 2021-08-17 DIAGNOSIS — M25572 Pain in left ankle and joints of left foot: Secondary | ICD-10-CM | POA: Diagnosis not present

## 2021-08-17 DIAGNOSIS — R2689 Other abnormalities of gait and mobility: Secondary | ICD-10-CM | POA: Diagnosis not present

## 2021-08-17 DIAGNOSIS — M5416 Radiculopathy, lumbar region: Secondary | ICD-10-CM

## 2021-08-17 DIAGNOSIS — R293 Abnormal posture: Secondary | ICD-10-CM

## 2021-08-17 NOTE — Therapy (Addendum)
OUTPATIENT PHYSICAL THERAPY TREATMENT NOTE    Patient Name: Abigail Cohen MRN: [id number] DOB:09-12-1940, 81 y.o., female Today's Date: 07/20/21     PT End of Session - 08/17/21 1533     Visit Number 12    Number of Visits 20    Date for PT Re-Evaluation 09/14/21    Authorization Type Medicare   recert done at visit 9 .    PT Start Time 1436    PT Stop Time 1514    PT Time Calculation (min) 38 min    Equipment Utilized During Treatment Gait belt    Activity Tolerance Patient tolerated treatment well    Behavior During Therapy WFL for tasks assessed/performed                  Past Medical History:  Diagnosis Date   AAA (abdominal aortic aneurysm)    Allergy    Essential hypertension h   Family history of anesthesia complication    daughter gets nauseated   Hearing loss    wears hearing aids   Hypertension    Knee joint replacement status    Osteoarthritis    Osteopenia    Osteoporosis    Presence of intracervical pessary    Uterine prolapse    Wears glasses    Wears partial dentures    upper and lower partial   Past Surgical History:  Procedure Laterality Date   ABDOMINAL AORTIC ANEURYSM REPAIR N/A 08/28/2019   Procedure: ANEURYSM ABDOMINAL AORTIC REPAIR OPEN;  Surgeon: Angelia Mould, MD;  Location: [facility] OR;  Service: Vascular;  Laterality: N/A;   COLONOSCOPY     EYE SURGERY     cataracts removal   HERNIA REPAIR  07/07/12   Dr Aretha Parrot HERNIA REPAIR  07/07/2012   Procedure: HERNIA REPAIR INGUINAL ADULT;  Surgeon: Haywood Lasso, MD;  Location: [facility];  Service: General;  Laterality: Left;  left inguinal area   REPLACEMENT TOTAL KNEE BILATERAL  2003   [place] EXTRACTION     Patient Active Problem List   Diagnosis Date Noted   nonobstructive CAD- follows with Dr. Audie Box cardiology  05/04/2021   Hyperlipidemia 10/30/2020   Essential hypertension 10/30/2020   Allergic rhinitis 10/30/2020    Osteoarthritis 10/30/2020   Abdominal aortic aneurysm (AAA) 08/28/2019   Osteoporosis 08/02/2016   Left inguinal hernia 06/20/2012    PCP: Marin Olp, MD  REFERRING PROVIDER: Marin Olp, MD  REFERRING DIAG: R sciatica, Gait abnormality.  THERAPY DIAG:  Radiculopathy, lumbar region  Other abnormalities of gait and mobility  Abnormal posture  Pain in left ankle and joints of left foot   SUBJECTIVE:  SUBJECTIVE STATEMENT: Pt states minimal improvements in pain, has been taking prednisone.   PERTINENT HISTORY:  Bil TKA, Osteoporosis, Significant scoliosis, AAA, Neuropathy,   PAIN:  Are you having pain? Yes, VAS scale: 5/10 Pain location: Leg  Pain orientation: Right  PAIN TYPE: burning and radiating  Pain description: intermittent  Aggravating factors: increased activity, transfers, standing, walking  Relieving factors: rest, heat    VAS scale: 5/10 Pain location: shoulder Pain orientation:Right  PAIN TYPE: aching   Pain description: intermittent  Aggravating factors: reaching, elevation  Relieving factors: rest, heat    PATIENT GOALS  decreased pain    OBJECTIVE:   08/03/21: LUMBARAROM/PROM Lumbar ROM: mild limitation for extension and R SB. Flexion Rom improved , minimal deficit.    Hips: mild limitation for ER and Ext Knees: WFL   LE MMT:  MMT Right 06/10/21 Left 06/10/21 Right  08/03/21 Left  08/03/21  Hip flexion 4- 4- 4 4  Hip extension      Hip abduction 3 4- 4- 4-  Hip adduction      Hip internal rotation      Hip external rotation 4- 4- 4 4  Knee flexion 4+ 4+ 4+ 4+  Knee extension 4 4 4+ 4+                           (Blank rows = not tested)  LUMBAR SPECIAL TESTS:  Neg SLR    GAIT: posture abnormalities from scoliosis, increased  lateral trunk sway,much improved with use of SPC,   poor heel to toe propulsion in feet, poor push off.   Dynamic balance: fair +.   TODAY'S TREATMENT   08/17/20: Therapeutic Exercise: Aerobic:  Supine:  pelvic tilts x 15 ;  Clams blue TB x 20;  Standing: March x 25 ,1 Ue support;  Modified Tandem stance 30 sec x 2 bil; hip abd x 10 alternating at counter  Seated: sit to stand x 10;  S/L:   Stretches: SKTC 30 sec x 3 on R;  Piriformis 30 sec x 2 on R;  Neuromuscular Re-education:  Manual Therapy: Long leg distraction bil;    08/13/21: Therapeutic Exercise: Aerobic:  Supine:  pelvic tilts x 15; bridging 2x 10  ;  Clams blue TB x 20;  Standing: March x 25 ,1 Ue support;  Hip abd x 20 on R;  Seated:  S/L:  clam x 20 bil;   Neuromuscular Re-education:  Manual Therapy: Long leg distraction on L, for  lumbar traction- painful to do on R;   S/L R decompression      08/06/21: Therapeutic Exercise: Aerobic:  Supine:  bridging 2x 10  ;  SLR x 10 bil;  SKTC30 sec x 3;   Clams blue TB x 20; Education on decompression position in hooklying;  Standing: March x 25 ,1 Ue support;   Seated:  S/L:    Neuromuscular Re-education: Walking with SPC 35 ft x 8 with education for proper sequencing;  AirEx: L/R and A/P weight shifts x 25 each; step ups onto AirEx x 10 bil;   Manual Therapy: Long leg distraction for R lumbar traction x 3  min;      PATIENT EDUCATION:  Education details: reviewed  HEP  Person educated: Patient Education method: Explanation, Demonstration, Tactile cues, Verbal cues, and Handouts Education comprehension: verbalized understanding, returned demonstration, verbal cues required, tactile cues required, and needs further education   HOME EXERCISE PROGRAM: Access Code: [phone] [phone]  ASSESSMENT:  CLINICAL IMPRESSION: 08/17/21:  Pt still having increased pain in R LE compared to previous weeks. She is having f/u with MD this week, for intervention planning. Discussed  continuing activities/strengthening that she is able to do, and not to over do activity to increase pain. Reviewed HEP and safe activities for back. Pt still doing well using SPC for pressure relief and for balance. Will make plan next visit, for d/c vs holding PT, Depending on intervention pt will be having for back pain.   Objective impairments include Abnormal gait, decreased activity tolerance, decreased balance, decreased coordination, decreased knowledge of use of DME, decreased mobility, difficulty walking, decreased ROM, decreased strength, hypomobility, increased muscle spasms, impaired flexibility, improper body mechanics, postural dysfunction, and pain. These impairments are limiting patient from cleaning, community activity, shopping, and yard work. Personal factors including 1 comorbidity: osteoporosis and 1-2 comorbidities: scoliosis, pain at multiple sites,   also affecting patient's functional outcome. Patient will benefit from skilled PT to address above impairments and improve overall function.    REHAB POTENTIAL: Good  CLINICAL DECISION MAKING: Evolving/moderate complexity  EVALUATION COMPLEXITY: Moderate   GOALS: Goals reviewed with patient? Yes  SHORT TERM GOALS: SHORT TERM GOALS:  STG Name Target Date Goal status  1 Patient will be independent with initial HEP for back and foot pain  06/24/21 MET                   LONG TERM GOALS:   LTG Name Target Date Goal status  1 Pt to be independent with final HEP.   09/14/21 Partially Met  2 Pt to report decreased pain in low back and LE to 0-3/10 with standing and activity.   09/14/21 Partially Met  3 Pt to report decreased pain in R foot to 0-2/10 with standing and walking   08/05/21 MET   4 Pt to demo improved ambulation pattern, with decreased lateral trunk sway,and improved balance, to be [facility], for improved efficiency.  09/14/21 Partially Met   5 Pt to demo balance to be [facility] for dynamic activity and with ambulation,  with SPC.   09/14/21 Initial   6  Pt to demo improved hip strength to at least 4/5 bil, to improve stability and gait.  09/14/21 Initial   7  Baseline:        PLAN: PT FREQUENCY: 1-2x/week  PT DURATION: 6 weeks  PLANNED INTERVENTIONS: Therapeutic exercises, Therapeutic activity, Neuro Muscular re-education, Balance training, Gait training, Patient/Family education, Joint mobilization, Stair training, Orthotic/Fit training, DME instructions, Dry Needling, Electrical stimulation, Spinal mobilization, Cryotherapy, Moist heat, Taping, Traction, Ultrasound, Ionotophoresis 23m/ml Dexamethasone, and Manual therapy   LLyndee Hensen PT, DPT 3:33 PM  08/17/21

## 2021-08-20 ENCOUNTER — Encounter: Payer: Medicare Other | Admitting: Physical Therapy

## 2021-08-20 DIAGNOSIS — M5441 Lumbago with sciatica, right side: Secondary | ICD-10-CM | POA: Diagnosis not present

## 2021-08-24 ENCOUNTER — Ambulatory Visit (INDEPENDENT_AMBULATORY_CARE_PROVIDER_SITE_OTHER): Payer: Medicare Other | Admitting: Physician Assistant

## 2021-08-24 ENCOUNTER — Encounter: Payer: Self-pay | Admitting: Physician Assistant

## 2021-08-24 ENCOUNTER — Other Ambulatory Visit: Payer: Self-pay

## 2021-08-24 VITALS — BP 162/82 | HR 84 | Temp 98.1°F | Ht <= 58 in | Wt 131.4 lb

## 2021-08-24 DIAGNOSIS — M5431 Sciatica, right side: Secondary | ICD-10-CM

## 2021-08-24 DIAGNOSIS — M5416 Radiculopathy, lumbar region: Secondary | ICD-10-CM

## 2021-08-24 DIAGNOSIS — N949 Unspecified condition associated with female genital organs and menstrual cycle: Secondary | ICD-10-CM

## 2021-08-24 NOTE — Progress Notes (Signed)
Subjective:    Patient ID: Abigail Cohen, female    DOB: 11-13-1940, 81 y.o.   MRN: [id number]  Chief Complaint  Patient presents with   Follow-up    HPI Patient is in today for discussion about imaging and R sciatica symptoms. She is here with her daughter. She has two paper copies of prior imaging results to go over.  States that she saw [facility] on 08/11/21, which we do have a copy of that visit in our system. Received a lumbar / SI region steroid injection and also a 8 day taper of prednisone. Symptoms did not improve per patient.   She was also given an Rx Tramadol 50 mg TID, which she has been alternating with Tylenol as well and doing somewhat better for pain control.  This coming Thursday (2/23) she is scheduled for lumbar MRI with Raliegh Ip.   Past Medical History:  Diagnosis Date   AAA (abdominal aortic aneurysm)    Allergy    Essential hypertension h   Family history of anesthesia complication    daughter gets nauseated   Hearing loss    wears hearing aids   Hypertension    Knee joint replacement status    Osteoarthritis    Osteopenia    Osteoporosis    Presence of intracervical pessary    Uterine prolapse    Wears glasses    Wears partial dentures    upper and lower partial    Past Surgical History:  Procedure Laterality Date   ABDOMINAL AORTIC ANEURYSM REPAIR N/A 08/28/2019   Procedure: ANEURYSM ABDOMINAL AORTIC REPAIR OPEN;  Surgeon: Angelia Mould, MD;  Location: [facility] OR;  Service: Vascular;  Laterality: N/A;   COLONOSCOPY     EYE SURGERY     cataracts removal   HERNIA REPAIR  07/07/12   Dr Aretha Parrot HERNIA REPAIR  07/07/2012   Procedure: HERNIA REPAIR INGUINAL ADULT;  Surgeon: Haywood Lasso, MD;  Location: [facility];  Service: General;  Laterality: Left;  left inguinal area   REPLACEMENT TOTAL KNEE BILATERAL  2003   TONSILLECTOMY  1945   WISDOM TOOTH EXTRACTION      Family History  Problem Relation Age of  Onset   Heart disease Mother    Heart attack Mother        13s   Arrhythmia Mother    Heart disease Father        65s   Heart attack Father    Cancer Sister        endometrial    Social History   Tobacco Use   Smoking status: Former    Years: 35.00    Types: Cigarettes    Start date: 07/05/2004    Quit date: 06/30/2005    Years since quitting: 16.1   Smokeless tobacco: Never  Vaping Use   Vaping Use: Never used  Substance Use Topics   Alcohol use: Yes    Alcohol/week: 7.0 standard drinks    Types: 7 Glasses of wine per week   Drug use: No     Allergies  Allergen Reactions   Penicillins     Blood shot eyes, felt poorly.  Tolerates amoxicillin with dentist    Review of Systems NEGATIVE UNLESS OTHERWISE INDICATED IN HPI      Objective:     BP (!) 162/82    Pulse 84    Temp 98.1 F (36.7 C)    Ht 4\' 10"  (1.473 m)  Wt 131 lb 6.1 oz (59.6 kg)    LMP  (LMP Unknown)    SpO2 94%    BMI 27.46 kg/m   Wt Readings from Last 3 Encounters:  08/24/21 131 lb 6.1 oz (59.6 kg)  08/13/21 131 lb 6.4 oz (59.6 kg)  08/06/21 130 lb 6.1 oz (59.1 kg)    BP Readings from Last 3 Encounters:  08/24/21 (!) 162/82  08/13/21 (!) 140/92  08/06/21 138/87     Physical Exam Vitals and nursing note reviewed.  Constitutional:      Appearance: Normal appearance.     Comments: Using a cane  Musculoskeletal:     Thoracic back: Scoliosis present.     Lumbar back: No spasms or tenderness. Normal range of motion. Positive right straight leg raise test. Negative left straight leg raise test. Scoliosis present.     Comments: She can push and pull against me in her lower extremities without difficulty.  No foot drop noted.  Neurological:     General: No focal deficit present.     Mental Status: She is alert and oriented to person, place, and time.     Motor: No weakness.     Gait: Gait normal.       Assessment & Plan:   Problem List Items Addressed This Visit   None Visit  Diagnoses     Right sided sciatica    -  Primary   Radiculopathy, lumbar region       Adnexal cyst           Plan:  1. Right sided sciatica 2. Radiculopathy, lumbar region -She has MRI scheduled for 08/27/21 with Raliegh Ip -She will f/up with [facility] from there -Will cont current Tramadol and Tylenol regimen. She is not driving while taking the tramadol and will postpone PT until after MRI.   MRI from 05/28/2019 - At that time the abdominal aortic aneurysm was present. There was a remote L1 compression fracture. Marked convex right scoliosis. Marked right foraminal narrowing at T11-12. Narrowing in the left subarticular recess and foramen at L2-3 and L3-4. Moderate central canal stenosis and left worse than right lateral recess and foraminal narrowing at L4-L5.  3. Adnexal cyst MRI 05/28/2019 -2.6 cm in diameter cystic-appearing lesion to the right of midline that was incompletely visualized and may be an adnexal cyst.  CT abdomen pelvis with contrast on 06/12/2019  -showed right adnexal lesion measuring 2.8 x 2.9 cm that which was better seen on the previous MRI.  There was also a potential tiny left adnexal cyst measuring 11 mm which may be a small area of posterior calcification.  --Pt states she was not made aware of this finding previously and did not have pelvic US or f/up done. As she is already scheduled for MRI in three days, will see if this area has changed or not, but will most likely need to plan for pelvic US pending MRI results. Pt agreeable. She will have imaging reports sent to our office.    Brienne Liguori M Burnis Halling, PA-C

## 2021-08-24 NOTE — Patient Instructions (Addendum)
-  Please sign record releases from [employer] and [employer]. -Pending MRI result from this coming Thursday, we may or may not need pelvic ultrasound. Will see if this adnexal lesion is visualized again and if it is changed or different or stable.  Thanks for coming in today!

## 2021-08-25 ENCOUNTER — Encounter: Payer: Medicare Other | Admitting: Physical Therapy

## 2021-08-27 ENCOUNTER — Encounter: Payer: Medicare Other | Admitting: Physical Therapy

## 2021-08-27 DIAGNOSIS — M545 Low back pain, unspecified: Secondary | ICD-10-CM | POA: Diagnosis not present

## 2021-08-31 ENCOUNTER — Encounter: Payer: Medicare Other | Admitting: Physical Therapy

## 2021-08-31 DIAGNOSIS — M79671 Pain in right foot: Secondary | ICD-10-CM | POA: Diagnosis not present

## 2021-08-31 DIAGNOSIS — M5441 Lumbago with sciatica, right side: Secondary | ICD-10-CM | POA: Diagnosis not present

## 2021-08-31 DIAGNOSIS — M25571 Pain in right ankle and joints of right foot: Secondary | ICD-10-CM | POA: Diagnosis not present

## 2021-09-07 DIAGNOSIS — M5416 Radiculopathy, lumbar region: Secondary | ICD-10-CM | POA: Diagnosis not present

## 2021-09-10 ENCOUNTER — Telehealth: Payer: Self-pay | Admitting: Family Medicine

## 2021-09-10 NOTE — Telephone Encounter (Signed)
Attempted to schedule AWV. Unable to LVM.  Will try at later time.  

## 2021-09-15 NOTE — Telephone Encounter (Signed)
Prolia VOB initiated via [email or web address] ? ?Last OV:  ?Next OV:  ?Last Prolia inj: 04/14/21 ?Next Prolia inj DUE: 10/14/21 ? ?

## 2021-09-17 NOTE — Telephone Encounter (Signed)
Pt ready for scheduling on or after 10/14/21 ? ?Out-of-pocket cost due at time of visit: $0 ? ?Primary: Medicare ?Prolia co-insurance: 20% (approximately $276) ?Admin fee co-insurance: 20% (approximately $25) ? ?Secondary: BCBS Medicare Supp ?Prolia co-insurance: Covers Medicare Part B co-insurance and deductible.  ?Admin fee co-insurance: Covers Medicare Part B co-insurance and deductible.  ? ?Deductible: $226 of $226 met (covered by secondary) ? ?Prior Auth: not required ?PA# ?Valid:  ? ?** This summary of benefits is an estimation of the patient's out-of-pocket cost. Exact cost may vary based on individual plan coverage.  ? ?

## 2021-09-18 NOTE — Telephone Encounter (Signed)
LVM for patient to schedule Prolia injection - can be scheduled 10/14/21 or after and $0 due at check in ?

## 2021-09-22 ENCOUNTER — Ambulatory Visit: Payer: Medicare Other | Admitting: Podiatry

## 2021-09-22 DIAGNOSIS — M5416 Radiculopathy, lumbar region: Secondary | ICD-10-CM | POA: Diagnosis not present

## 2021-09-24 ENCOUNTER — Ambulatory Visit: Payer: Medicare Other | Admitting: Podiatry

## 2021-10-06 ENCOUNTER — Ambulatory Visit: Payer: Medicare Other | Admitting: Podiatry

## 2021-10-13 DIAGNOSIS — M48062 Spinal stenosis, lumbar region with neurogenic claudication: Secondary | ICD-10-CM | POA: Diagnosis not present

## 2021-10-14 ENCOUNTER — Ambulatory Visit: Payer: Medicare Other

## 2021-10-15 ENCOUNTER — Ambulatory Visit (INDEPENDENT_AMBULATORY_CARE_PROVIDER_SITE_OTHER): Payer: Medicare Other | Admitting: Podiatry

## 2021-10-15 ENCOUNTER — Encounter: Payer: Self-pay | Admitting: Podiatry

## 2021-10-15 DIAGNOSIS — B351 Tinea unguium: Secondary | ICD-10-CM

## 2021-10-15 DIAGNOSIS — M79676 Pain in unspecified toe(s): Secondary | ICD-10-CM | POA: Diagnosis not present

## 2021-10-18 NOTE — Progress Notes (Signed)
Presents today chief complaint of painful elongated toenails. ? ?Objective: Vital signs are stable alert oriented x3 no erythema edema cellulitis drainage or odor.  Nails are long painful and ill.  Severe hammertoe deformities bilateral.  No open lesions or wounds. ? ?Assessment: Pain in limb secondary onychomycosis. ? ?Plan: Debridement of toenails 1 through 5 bilateral covered service secondary pain. ?

## 2021-10-20 DIAGNOSIS — M5441 Lumbago with sciatica, right side: Secondary | ICD-10-CM | POA: Diagnosis not present

## 2021-10-20 DIAGNOSIS — M67911 Unspecified disorder of synovium and tendon, right shoulder: Secondary | ICD-10-CM | POA: Diagnosis not present

## 2021-10-20 DIAGNOSIS — M25511 Pain in right shoulder: Secondary | ICD-10-CM | POA: Diagnosis not present

## 2021-10-28 DIAGNOSIS — R768 Other specified abnormal immunological findings in serum: Secondary | ICD-10-CM | POA: Diagnosis not present

## 2021-10-28 DIAGNOSIS — E663 Overweight: Secondary | ICD-10-CM | POA: Diagnosis not present

## 2021-10-28 DIAGNOSIS — Z6827 Body mass index (BMI) 27.0-27.9, adult: Secondary | ICD-10-CM | POA: Diagnosis not present

## 2021-10-28 DIAGNOSIS — M1991 Primary osteoarthritis, unspecified site: Secondary | ICD-10-CM | POA: Diagnosis not present

## 2021-10-28 DIAGNOSIS — M48061 Spinal stenosis, lumbar region without neurogenic claudication: Secondary | ICD-10-CM | POA: Diagnosis not present

## 2021-10-28 DIAGNOSIS — M25519 Pain in unspecified shoulder: Secondary | ICD-10-CM | POA: Diagnosis not present

## 2021-10-31 DIAGNOSIS — Z20822 Contact with and (suspected) exposure to covid-19: Secondary | ICD-10-CM | POA: Diagnosis not present

## 2021-11-02 DIAGNOSIS — Z4689 Encounter for fitting and adjustment of other specified devices: Secondary | ICD-10-CM | POA: Diagnosis not present

## 2021-11-02 DIAGNOSIS — M8580 Other specified disorders of bone density and structure, unspecified site: Secondary | ICD-10-CM | POA: Diagnosis not present

## 2021-11-04 ENCOUNTER — Other Ambulatory Visit: Payer: Self-pay | Admitting: Obstetrics and Gynecology

## 2021-11-04 DIAGNOSIS — M8580 Other specified disorders of bone density and structure, unspecified site: Secondary | ICD-10-CM

## 2021-11-05 ENCOUNTER — Ambulatory Visit (INDEPENDENT_AMBULATORY_CARE_PROVIDER_SITE_OTHER): Payer: Medicare Other | Admitting: Family Medicine

## 2021-11-05 ENCOUNTER — Encounter: Payer: Self-pay | Admitting: Family Medicine

## 2021-11-05 VITALS — BP 130/74 | HR 73 | Temp 98.6°F | Ht <= 58 in | Wt 128.1 lb

## 2021-11-05 DIAGNOSIS — I10 Essential (primary) hypertension: Secondary | ICD-10-CM | POA: Diagnosis not present

## 2021-11-05 DIAGNOSIS — I700 Atherosclerosis of aorta: Secondary | ICD-10-CM | POA: Diagnosis not present

## 2021-11-05 DIAGNOSIS — E785 Hyperlipidemia, unspecified: Secondary | ICD-10-CM

## 2021-11-05 DIAGNOSIS — I714 Abdominal aortic aneurysm, without rupture, unspecified: Secondary | ICD-10-CM

## 2021-11-05 DIAGNOSIS — J439 Emphysema, unspecified: Secondary | ICD-10-CM

## 2021-11-05 DIAGNOSIS — N83201 Unspecified ovarian cyst, right side: Secondary | ICD-10-CM

## 2021-11-05 DIAGNOSIS — I2510 Atherosclerotic heart disease of native coronary artery without angina pectoris: Secondary | ICD-10-CM | POA: Diagnosis not present

## 2021-11-05 DIAGNOSIS — N83202 Unspecified ovarian cyst, left side: Secondary | ICD-10-CM

## 2021-11-05 DIAGNOSIS — M810 Age-related osteoporosis without current pathological fracture: Secondary | ICD-10-CM

## 2021-11-05 DIAGNOSIS — K5790 Diverticulosis of intestine, part unspecified, without perforation or abscess without bleeding: Secondary | ICD-10-CM

## 2021-11-05 DIAGNOSIS — N949 Unspecified condition associated with female genital organs and menstrual cycle: Secondary | ICD-10-CM

## 2021-11-05 DIAGNOSIS — Z87891 Personal history of nicotine dependence: Secondary | ICD-10-CM | POA: Diagnosis not present

## 2021-11-05 NOTE — Patient Instructions (Addendum)
Team irrigate left ear. Can refer to ENT if not improved ? ?Mineral oil for ear full of wax- or could try once a week ?Purchase mineral oil from laxative aisle ?Lay down on your side with ear that is bothering you facing up ?Use 3-4 drops with a dropper and place in ear for 30 seconds ?Place cotton swab outside of ear ?Turn to other side and allow this to drain ?Repeat 3-4 x a day ?Return to see [place] if not improving within a few days ? ?Please stop by lab before you go ?If you have mychart- we will send your results within 3 business days of [place] receiving them.  ?If you do not have mychart- we will call you about results within 5 business days of [place] receiving them.  ?*please also note that you will see labs on mychart as soon as they post. I will later go in and write notes on them- will say "notes from Dr. Yong Channel"  ? ?Recommended follow up: Return in about 6 months (around 05/08/2022) for followup or sooner if needed.Schedule b4 you leave. ?

## 2021-11-05 NOTE — Progress Notes (Addendum)
?Phone [phone] ?In person visit ?  ?Subjective:  ? ?Abigail Cohen is a 81 y.o. year old very pleasant female patient who presents for/with See problem oriented charting ?Chief Complaint  ?Patient presents with  ? Follow-up  ?  6 month follow-up ?  ? ? ?Past Medical History-  ?Patient Active Problem List  ? Diagnosis Date Noted  ? 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 Audie Box cardiology  05/04/2021  ?  Priority: High  ? Abdominal aortic aneurysm (AAA) ([place]) 08/28/2019  ?  Priority: High  ? Hyperlipidemia 10/30/2020  ?  Priority: Medium   ? Essential hypertension 10/30/2020  ?  Priority: Medium   ? Osteoarthritis 10/30/2020  ?  Priority: Medium   ? Osteoporosis 08/02/2016  ?  Priority: Medium   ? Aortic atherosclerosis ([place]) 11/05/2021  ?  Priority: Low  ? Emphysema lung ([place]) 11/05/2021  ?  Priority: Low  ? Diverticulosis 11/05/2021  ?  Priority: Low  ? Allergic rhinitis 10/30/2020  ?  Priority: Low  ? Left inguinal hernia 06/20/2012  ?  Priority: Low  ? ? ?Medications- reviewed and updated ?Current Outpatient Medications  ?Medication Sig Dispense Refill  ? acetaminophen (TYLENOL) 500 MG tablet Take 500 mg by mouth every 6 (six) hours as needed (ARTHRITIS PAIN.).    ? Alpha Lipoic Acid 200 MG CAPS See admin instructions.    ? aspirin EC 81 MG tablet Take 1 tablet (81 mg total) by mouth daily.    ? BENFOTIAMINE PO Take 300 mg by mouth daily.    ? CALCIUM CITRATE PO Take 1-2 tablets by mouth at bedtime.    ? Cholecalciferol (VITAMIN D3) 10 MCG (400 UNIT) tablet Take 400 Units by mouth daily.    ? denosumab (PROLIA) 60 MG/ML SOSY injection Inject 60 mg into the skin every 6 (six) months.    ? estradiol (ESTRACE) 0.1 MG/GM vaginal cream Place 1 Applicatorful vaginally 2 (two) times a week.     ? gabapentin (NEURONTIN) 300 MG capsule Take 300 mg by mouth at bedtime.    ? ibuprofen (ADVIL) 200 MG tablet 2 tabs    ? Magnesium 250 MG TABS Take 250 mg by mouth daily.     ? metoprolol succinate (TOPROL-XL) 25 MG 24 hr  tablet Take 1 tablet (25 mg total) by mouth daily. 90 tablet 3  ? Multiple Vitamin (MULTIVITAMIN WITH MINERALS) TABS tablet Take 1 tablet by mouth daily.    ? polyethylene glycol (MIRALAX / GLYCOLAX) 17 g packet 1/2 packet mixed with 8 ounces of fluid    ? rosuvastatin (CRESTOR) 10 MG tablet TAKE 1 TABLET(10 MG) BY MOUTH DAILY 90 tablet 3  ? traMADol (ULTRAM) 50 MG tablet Take 50 mg by mouth every 8 (eight) hours as needed. (Patient not taking: Reported on 11/05/2021)    ? ?No current facility-administered medications for this visit.  ? ?  ?Objective:  ?BP 130/74   Pulse 73   Temp 98.6 ?F (37 ?C) (Temporal)   Ht '4\' 10"'$  (1.473 m)   Wt 128 lb 2 oz (58.1 kg)   LMP  (LMP Unknown)   SpO2 94%   BMI 26.78 kg/m?  ?Gen: NAD, resting comfortably ?CV: RRR no murmurs rubs or gallops ?Lungs: CTAB no crackles, wheeze, rhonchi ?Ext: no edema ?Skin: warm, dry ? ?  ? ?Assessment and Plan  ? ?#Abdominal aortic aneurysm without rupture with history of open repair-follows with Dr. Audie Box of cardiology as well as vascular surgery ?S:Medication: Aspirin and statin recommended per  cardiology. ?A/P: overall stable- has follow up with vascular and cardiology per her report  ? ?#Coronary artery disease based on coronary artery calcification seen on CT chest with reassuring stress test and echocardiogram-follows with Dr. Davina Poke of cardiology ?#hyperlipidemia- LDL goal under 70 ?#aortic atherosclerosis ?S: Medication:Rosuvastatin 10 mg, aspirin '81mg'$ , metoprolol 25 mg XR ?-Denies any chest pain or shortness of breath with exertion (sometimes gets winded with anxiety) ?Lab Results  ?Component Value Date  ? CHOL 145 09/22/2020  ? HDL 75 (A) 09/22/2020  ? [place] 63 09/22/2020  ? TRIG 51 09/22/2020  ? A/P: Nonobstructive CAD-continue current medication and cardiology follow-up ? ?For hyperlipidemia-LDL goal under 70-due for full lipid panel today-continue current medication for now ? ?#hypertension ?S: medication: Metoprolol succinate 25 mg  XR  ?BP Readings from Last 3 Encounters:  ?11/05/21 130/74  ?08/24/21 (!) 162/82  ?08/13/21 (!) 140/92  ?  ?A/P:  Controlled. Continue current medications.   ?  ?# Osteoporosis- following with Dr. Cruzita Lederer ?S: Last DEXA: 06/23/2020 ? ?Medication (bisphosphonate or prolia): Prolia ? ?Calcium: '1200mg'$  (through diet ok) recommended  ?Vitamin D: 1000 units a day recommended ?Last vitamin D-greater than 35 in February 2023 ?A/P: Osteoporosis has been stable-continue follow-up with Dr. Cruzita Lederer and for prolia ? ?#Osteoarthritis- follows with Dr. Trudie Reed of rheumatology. Bilateral knee replacements, hammer toes, bunions, back, scoliosis.  ?- most recently working with [facility] for back pain- has had MRI, injections and doing PT now and referred for accupuncture. Was told most recently issues are from spinal stenosis ?- wants labs forwarded to Dr. Trudie Reed ?-has tramadol if needed ?-dental prophylaxis from dentist- started out with orthopedist ? ?# neuropathy- on alpha lipoic acid through Dr. Cruzita Lederer originally patient reports. also sees podiatry and has hammer toes. some balance issues- does use a cane ? ?#emphysema noted on CT abd/pelvis on lower portion of lungs in 2020-  asymptomatic- continue to monitor. Former smoker ? ?#Forehead polyp/supraorbital 1 cm osteoma - no pain and seems stable to her- wants to monitor ? ?#colonoscopy- 07/04/12 with Dr. Earle Gell- was told last colonoscop. Diverticulosis only with [place]- we will scan in copy  ? ?# adnexal cyst possibly ovarian  ?S:patient shows me imaging from CT abdomen 06/12/2019 which shows "2. Right adnexal lesion measuring 2.8 x 2.9 cm better seen on ?previous MRI evaluation of the lumbar spine due to numerous bowel ?loops in the pelvis limit assessment. This may arise from the right ?adnexa. It has a simple appearance but exact location and or origin ?is difficult to assess due to above reasons. Consider follow-up ?sonography as this may place the within the ovary  at allow for ?better characterization. ?3. Potential tiny left adnexal cyst measuring 11 mm. This may show a ?small area of subtle posterior calcification. This may also show ?some benefit from ultrasound assessment though ultrasound assessment ?may be limited secondary to ovarian position."  ?A/P: adnexal cyst possible ovarian cyst noted from prior imaging-  will update pelvic ultrasound at this time.   ? ?#occasionally feels like chicken is hard to swallow - is going ot try smaller bites and chewing better- consider endoscopy if not improving ? ?# Left ear - feels fullness - team will irrigate- advised could do mineral oil once a week ? ?# former smoker- quit in 2006- 35 years at least. Get UA with yearly labs  including today ? ? ?Recommended follow up: Return in about 6 months (around 05/08/2022) for followup or sooner if needed.Schedule b4 you leave. ?Future Appointments  ?  Date Time Provider [place]  ?11/11/2021 11:00 AM [facility] NURSE [facility] None  ?04/20/2022  1:15 PM Hyatt, Romilda Garret, DPM [facility]  ?06/24/2022  1:00 PM [facility] DX DEXA 1 [facility]  ?08/19/2022  1:00 PM Philemon Kingdom, MD [facility] None  ? ? ?Lab/Order associations: ?  ICD-10-CM   ?1. Abdominal aortic aneurysm (AAA) without rupture, unspecified part ([place])  I71.40   ?  ?2. Essential hypertension  I10   ?  ?3. Hyperlipidemia, unspecified hyperlipidemia type  E78.5 CBC with Differential/Platelet  ?  Comprehensive metabolic panel  ?  Lipid panel  ?  ?4. Aortic atherosclerosis ([facility])  I70.0   ?  ?5. Pulmonary emphysema, unspecified emphysema type ([place])  J43.9   ?  ?6. Diverticulosis  K57.90   ?  ?7. Adnexal cyst  N94.9 US PELVIC COMPLETE WITH TRANSVAGINAL  ?  ?8. Osteoporosis without current pathological fracture, unspecified osteoporosis type  M81.0   ?  ?9. Cysts of both ovaries  N83.201 US PELVIC COMPLETE WITH TRANSVAGINAL  ? N83.202   ?  ?10. Former smoker  Z87.891 POCT Urinalysis Dipstick (Automated)  ?   ? ?Time Spent: ?42 minutes of total time (2:10 PM- 2:52 PM) was spent on the date of the encounter performing the following actions: chart review prior to seeing the patient and review in room with documents she b

## 2021-11-06 DIAGNOSIS — Z20822 Contact with and (suspected) exposure to covid-19: Secondary | ICD-10-CM | POA: Diagnosis not present

## 2021-11-06 LAB — CBC WITH DIFFERENTIAL/PLATELET
Basophils Absolute: 0 10*3/uL (ref 0.0–0.1)
Basophils Relative: 0.3 % (ref 0.0–3.0)
Eosinophils Absolute: 0.1 10*3/uL (ref 0.0–0.7)
Eosinophils Relative: 1.4 % (ref 0.0–5.0)
HCT: 39.3 % (ref 36.0–46.0)
Hemoglobin: 13.1 g/dL (ref 12.0–15.0)
Lymphocytes Relative: 22.9 % (ref 12.0–46.0)
Lymphs Abs: 1.4 10*3/uL (ref 0.7–4.0)
MCHC: 33.3 g/dL (ref 30.0–36.0)
MCV: 95.3 fl (ref 78.0–100.0)
Monocytes Absolute: 0.5 10*3/uL (ref 0.1–1.0)
Monocytes Relative: 7.7 % (ref 3.0–12.0)
Neutro Abs: 4 10*3/uL (ref 1.4–7.7)
Neutrophils Relative %: 67.7 % (ref 43.0–77.0)
Platelets: 190 10*3/uL (ref 150.0–400.0)
RBC: 4.12 Mil/uL (ref 3.87–5.11)
RDW: 14.3 % (ref 11.5–15.5)
WBC: 5.9 10*3/uL (ref 4.0–10.5)

## 2021-11-06 LAB — COMPREHENSIVE METABOLIC PANEL
ALT: 22 U/L (ref 0–35)
AST: 31 U/L (ref 0–37)
Albumin: 4.3 g/dL (ref 3.5–5.2)
Alkaline Phosphatase: 42 U/L (ref 39–117)
BUN: 27 mg/dL — ABNORMAL HIGH (ref 6–23)
CO2: 31 mEq/L (ref 19–32)
Calcium: 9.8 mg/dL (ref 8.4–10.5)
Chloride: 100 mEq/L (ref 96–112)
Creatinine, Ser: 0.73 mg/dL (ref 0.40–1.20)
GFR: 76.13 mL/min (ref >60.00)
Glucose, Bld: 81 mg/dL (ref 70–99)
Potassium: 4.5 mEq/L (ref 3.5–5.1)
Sodium: 139 mEq/L (ref 135–145)
Total Bilirubin: 0.5 mg/dL (ref 0.2–1.2)
Total Protein: 7.1 g/dL (ref 6.0–8.3)

## 2021-11-06 LAB — LIPID PANEL
Cholesterol: 147 mg/dL (ref 0–200)
HDL: 74.6 mg/dL (ref >39.00)
LDL Cholesterol: 63 mg/dL (ref 0–99)
NonHDL: 72.6
Total CHOL/HDL Ratio: 2
Triglycerides: 46 mg/dL (ref 0.0–149.0)
VLDL: 9.2 mg/dL (ref 0.0–40.0)

## 2021-11-10 ENCOUNTER — Telehealth: Payer: Self-pay | Admitting: Family Medicine

## 2021-11-10 ENCOUNTER — Encounter: Payer: Self-pay | Admitting: Family Medicine

## 2021-11-10 DIAGNOSIS — H6123 Impacted cerumen, bilateral: Secondary | ICD-10-CM

## 2021-11-10 NOTE — Telephone Encounter (Signed)
Called and spoke with pt and she is not confused about anything, she was just wanting an ENT referral placed. I made pt aware that I will place the referral. ?

## 2021-11-10 NOTE — Telephone Encounter (Signed)
Patient is confused with AVS instructions from 11/05/21 visit - Patient would like a referral for ENT and mineral wax removal did not work.  ?

## 2021-11-11 ENCOUNTER — Ambulatory Visit (INDEPENDENT_AMBULATORY_CARE_PROVIDER_SITE_OTHER): Payer: Medicare Other

## 2021-11-11 ENCOUNTER — Ambulatory Visit: Payer: Medicare Other

## 2021-11-11 DIAGNOSIS — M810 Age-related osteoporosis without current pathological fracture: Secondary | ICD-10-CM | POA: Diagnosis not present

## 2021-11-11 MED ORDER — DENOSUMAB 60 MG/ML ~~LOC~~ SOSY
60.0000 mg | PREFILLED_SYRINGE | Freq: Once | SUBCUTANEOUS | Status: AC
  Administered 2021-11-11: 60 mg via SUBCUTANEOUS

## 2021-11-11 NOTE — Progress Notes (Signed)
Patient seen today for Prolia injection.  '60mg'$ /ml given in right arm subQ.  Patient tolerated well.  Follow up 6 months or as advised by provider.  ? ?Patient verified name, DOB and verbalized consent prior to administration.  ?

## 2021-11-17 ENCOUNTER — Ambulatory Visit
Admission: RE | Admit: 2021-11-17 | Discharge: 2021-11-17 | Disposition: A | Discharge status: Home / Self Care | Payer: Medicare Other | Source: Ambulatory Visit | Attending: Family Medicine | Admitting: Family Medicine

## 2021-11-17 ENCOUNTER — Other Ambulatory Visit: Payer: Self-pay

## 2021-11-17 DIAGNOSIS — N949 Unspecified condition associated with female genital organs and menstrual cycle: Secondary | ICD-10-CM

## 2021-11-17 DIAGNOSIS — Z780 Asymptomatic menopausal state: Secondary | ICD-10-CM | POA: Diagnosis not present

## 2021-11-17 DIAGNOSIS — N83201 Unspecified ovarian cyst, right side: Secondary | ICD-10-CM

## 2021-11-17 MED ORDER — METOPROLOL SUCCINATE ER 25 MG PO TB24: 25.0000 mg | ORAL_TABLET | Freq: Every day | ORAL | 3 refills | Status: DC

## 2021-11-17 NOTE — Telephone Encounter (Signed)
Last Prolia inj 11/11/21 ?Next Prolia inj due 05/15/22 ?

## 2021-11-23 DIAGNOSIS — M48062 Spinal stenosis, lumbar region with neurogenic claudication: Secondary | ICD-10-CM | POA: Diagnosis not present

## 2021-12-04 ENCOUNTER — Encounter: Payer: Self-pay | Admitting: Family Medicine

## 2021-12-07 ENCOUNTER — Other Ambulatory Visit: Payer: Self-pay

## 2021-12-07 DIAGNOSIS — H6123 Impacted cerumen, bilateral: Secondary | ICD-10-CM

## 2021-12-21 ENCOUNTER — Other Ambulatory Visit: Payer: Self-pay | Admitting: Obstetrics and Gynecology

## 2021-12-21 DIAGNOSIS — Z1231 Encounter for screening mammogram for malignant neoplasm of breast: Secondary | ICD-10-CM

## 2021-12-23 DIAGNOSIS — Z23 Encounter for immunization: Secondary | ICD-10-CM | POA: Diagnosis not present

## 2021-12-25 IMAGING — CT CT MAXILLOFACIAL W/O CM
1 series · 10 of 30 positions shown, 13 images · non-contrast
Comparison: None.

CLINICAL DATA: Bony growth under the right eyebrow.

EXAM:
CT MAXILLOFACIAL WITHOUT CONTRAST
TECHNIQUE: Multidetector CT imaging of the maxillofacial structures was
performed. Multiplanar CT image reconstructions were also generated.

[Series 7: cor soft · coronal · 0.21mm/px · 10 of 85 slices shown, 13 images]
[im 6/85  brain]
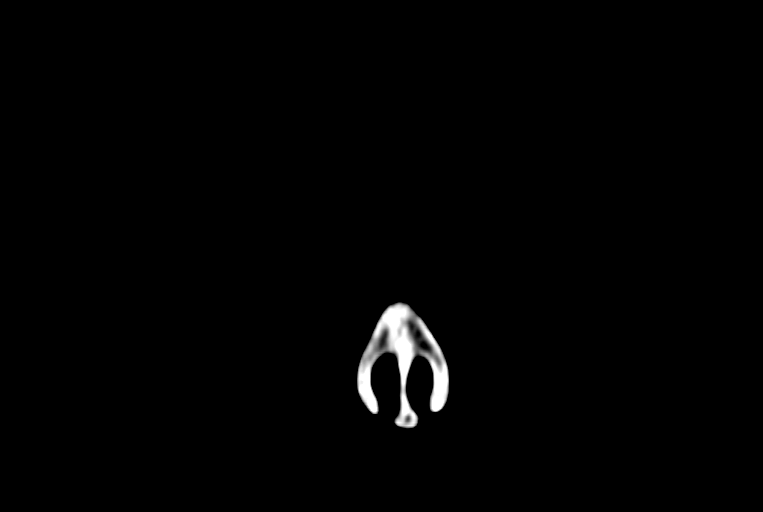
[im 6/85  bone]
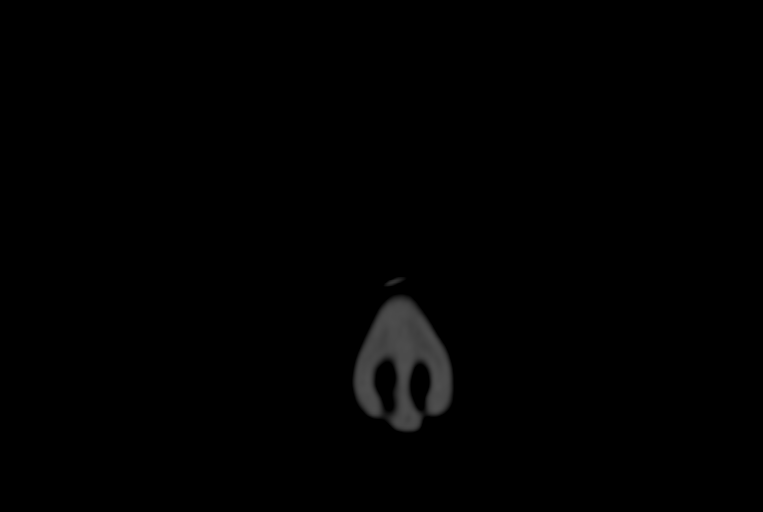
[im 15/85  bone]
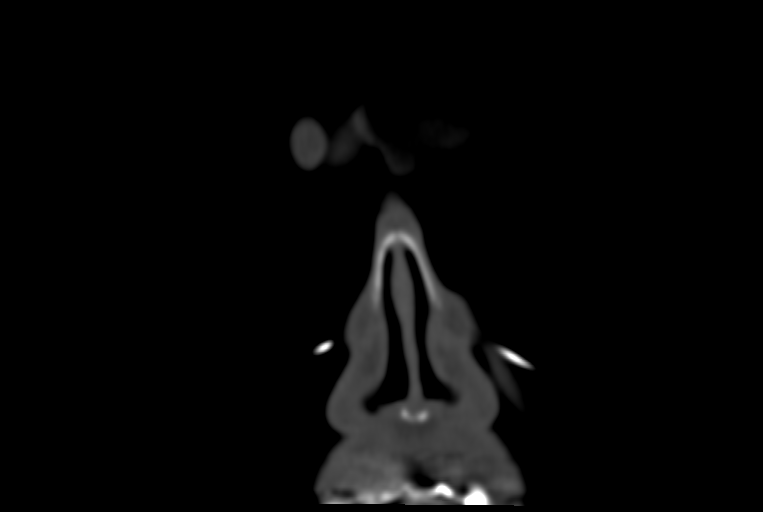
[im 24/85  bone]
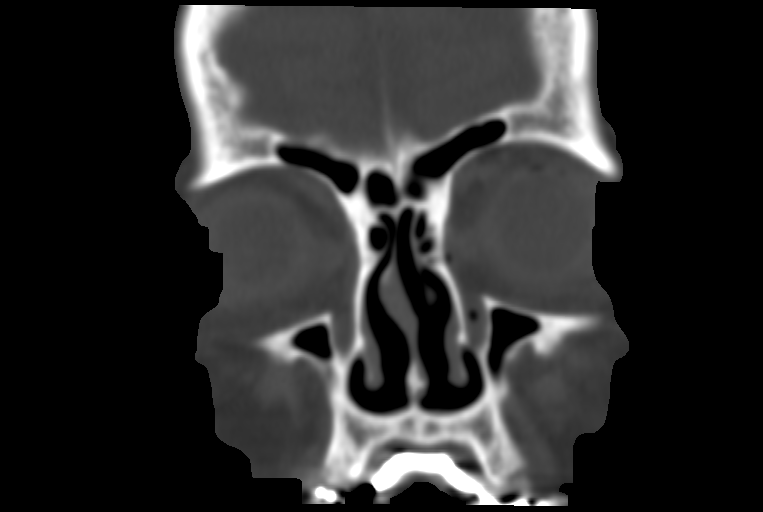
[im 29/85  bone]
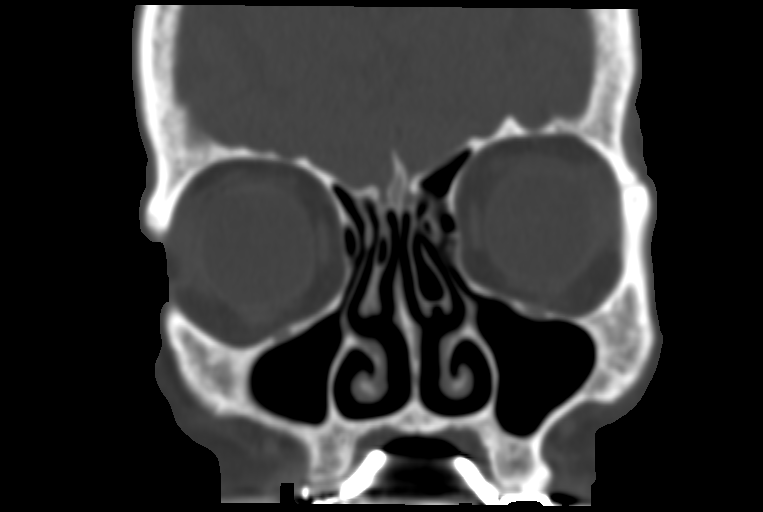
[im 38/85  brain]
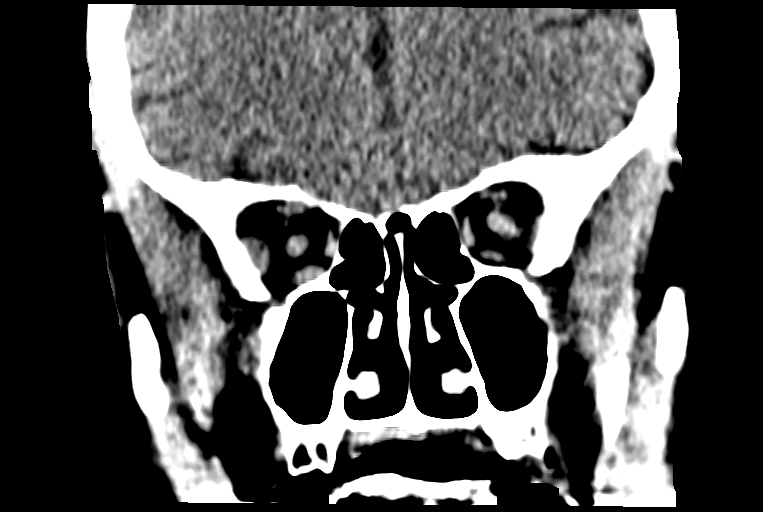
[im 38/85  bone]
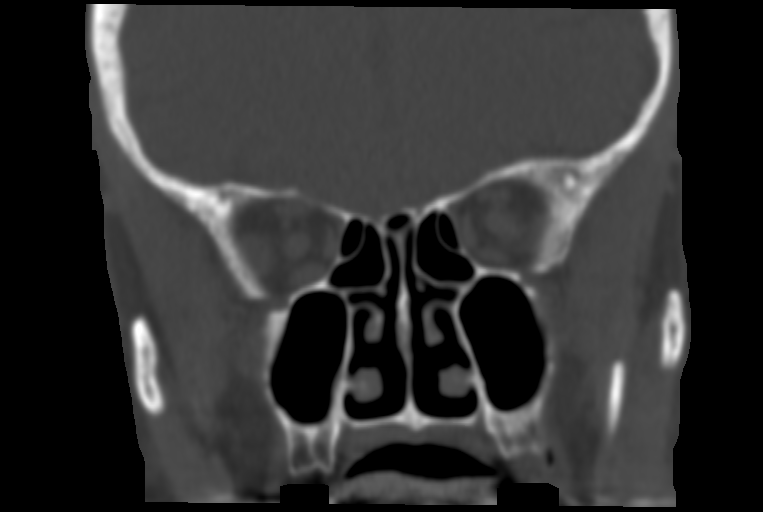
[im 47/85  bone]
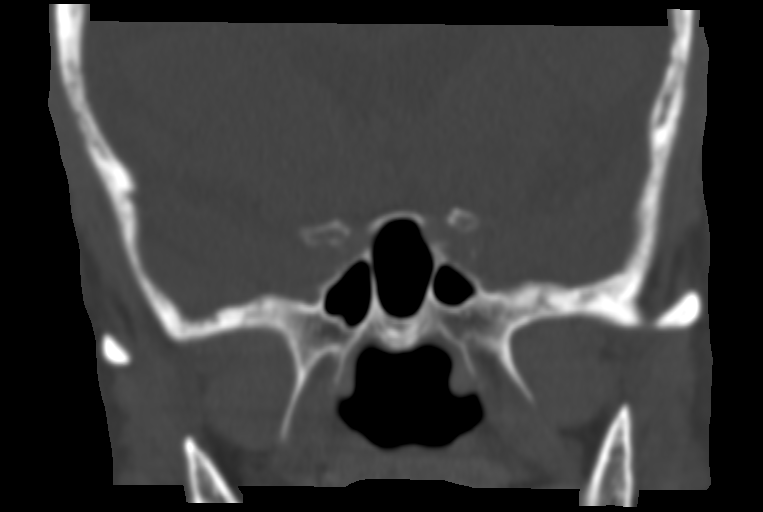
[im 56/85  bone]
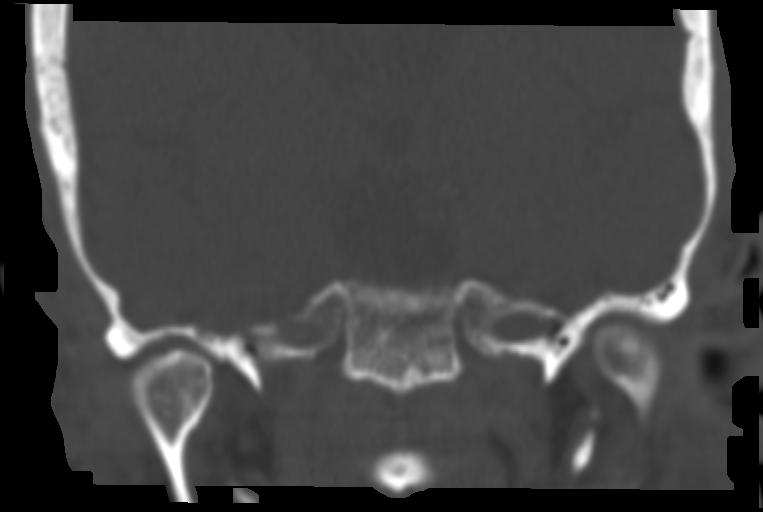
[im 64/85  bone]
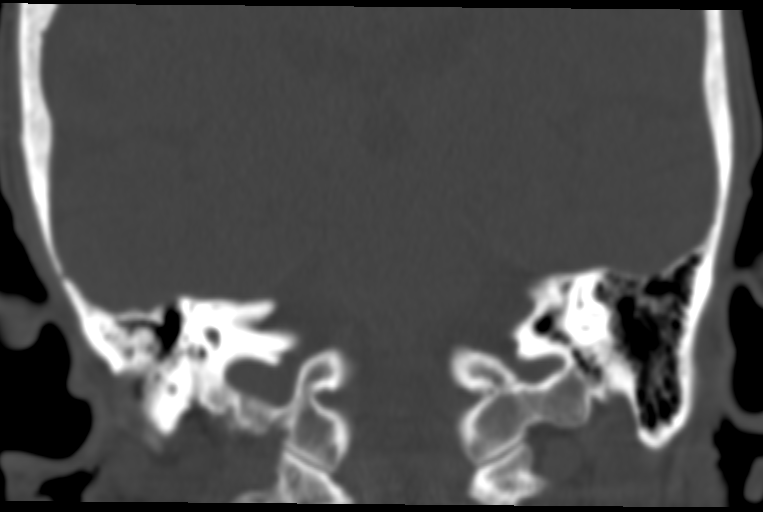
[im 70/85  brain]
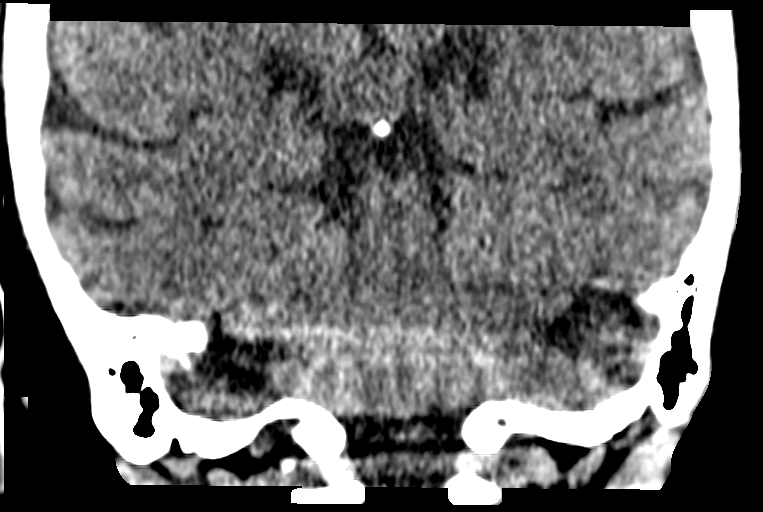
[im 70/85  bone]
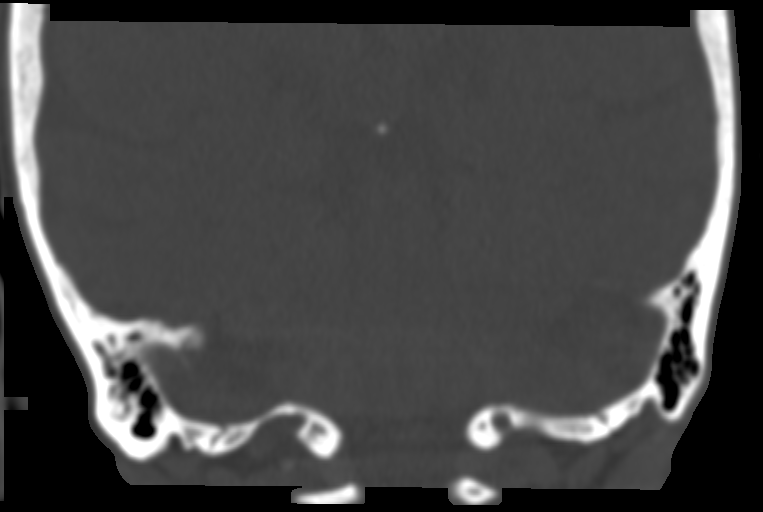
[im 79/85  bone]
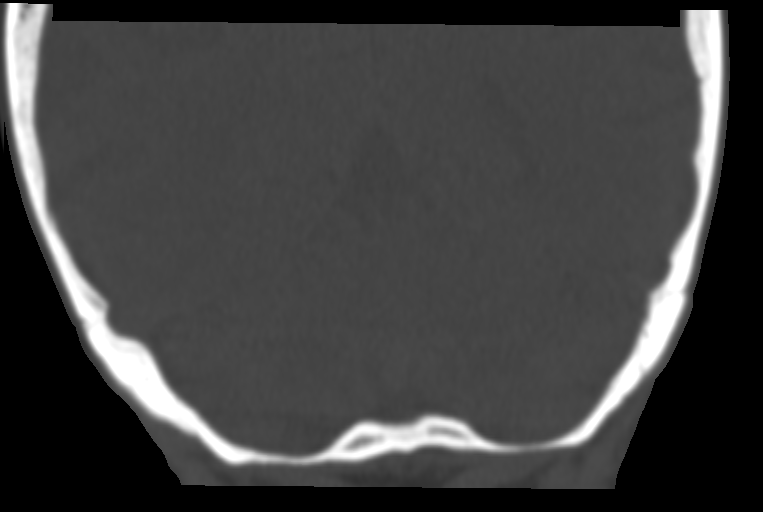

[10 of 30 positions shown; findings below may reference images not displayed]

FINDINGS: Osseous: 11 x 10 x 4 mm osteoma in the right supraorbital region
corresponding to the palpable abnormality. No suspicious osseous
lesion or fracture.

Orbits: Bilateral cataract extraction.

Sinuses: Small right mastoid effusion.  Clear paranasal sinuses.

Soft tissues: 5 mm exophytic skin nodule along the right forehead,
correlate with direct visualization.

Limited intracranial: No acute abnormality.
IMPRESSION: 1 cm benign right supraorbital osteoma.

## 2022-03-03 DIAGNOSIS — M19011 Primary osteoarthritis, right shoulder: Secondary | ICD-10-CM | POA: Diagnosis not present

## 2022-03-19 ENCOUNTER — Ambulatory Visit
Admission: RE | Admit: 2022-03-19 | Discharge: 2022-03-19 | Disposition: A | Discharge status: Home / Self Care | Payer: Medicare Other | Source: Ambulatory Visit | Attending: Obstetrics and Gynecology | Admitting: Obstetrics and Gynecology

## 2022-03-19 DIAGNOSIS — Z1231 Encounter for screening mammogram for malignant neoplasm of breast: Secondary | ICD-10-CM

## 2022-03-29 ENCOUNTER — Encounter: Payer: Self-pay | Admitting: *Deleted

## 2022-03-31 DIAGNOSIS — Z01419 Encounter for gynecological examination (general) (routine) without abnormal findings: Secondary | ICD-10-CM | POA: Diagnosis not present

## 2022-03-31 DIAGNOSIS — Z4689 Encounter for fitting and adjustment of other specified devices: Secondary | ICD-10-CM | POA: Diagnosis not present

## 2022-04-15 NOTE — Telephone Encounter (Signed)
Prolia VOB initiated via [email or web address]  Last Prolia inj 11/11/21 Next Prolia inj due 05/15/22

## 2022-04-20 ENCOUNTER — Ambulatory Visit (INDEPENDENT_AMBULATORY_CARE_PROVIDER_SITE_OTHER): Payer: Medicare Other | Admitting: Podiatry

## 2022-04-20 ENCOUNTER — Encounter: Payer: Self-pay | Admitting: Podiatry

## 2022-04-20 DIAGNOSIS — B351 Tinea unguium: Secondary | ICD-10-CM | POA: Diagnosis not present

## 2022-04-20 DIAGNOSIS — M79676 Pain in unspecified toe(s): Secondary | ICD-10-CM

## 2022-04-20 NOTE — Progress Notes (Signed)
She presents today chief complaint of painful elongated toenails.  Toenails are long thick yellow dystrophic-like mycotic.  Assessment: Pain in limb secondary onychomycosis.  Plan: Debridement of toenails 1 through 5 bilateral.

## 2022-04-24 DIAGNOSIS — M79641 Pain in right hand: Secondary | ICD-10-CM | POA: Diagnosis not present

## 2022-04-29 DIAGNOSIS — M1991 Primary osteoarthritis, unspecified site: Secondary | ICD-10-CM | POA: Diagnosis not present

## 2022-04-29 DIAGNOSIS — E663 Overweight: Secondary | ICD-10-CM | POA: Diagnosis not present

## 2022-04-29 DIAGNOSIS — Z6827 Body mass index (BMI) 27.0-27.9, adult: Secondary | ICD-10-CM | POA: Diagnosis not present

## 2022-04-29 DIAGNOSIS — R768 Other specified abnormal immunological findings in serum: Secondary | ICD-10-CM | POA: Diagnosis not present

## 2022-04-29 DIAGNOSIS — M542 Cervicalgia: Secondary | ICD-10-CM | POA: Diagnosis not present

## 2022-04-29 DIAGNOSIS — M25511 Pain in right shoulder: Secondary | ICD-10-CM | POA: Diagnosis not present

## 2022-04-29 DIAGNOSIS — M79641 Pain in right hand: Secondary | ICD-10-CM | POA: Diagnosis not present

## 2022-04-29 DIAGNOSIS — M48061 Spinal stenosis, lumbar region without neurogenic claudication: Secondary | ICD-10-CM | POA: Diagnosis not present

## 2022-04-30 NOTE — Telephone Encounter (Signed)
Pt ready for scheduling on or after 05/15/22   Out-of-pocket cost due at time of visit: $0   Primary: Medicare Prolia co-insurance: 20% (approximately $276) Admin fee co-insurance: 20% (approximately $25)   Secondary: BCBS Medicare Supp Prolia co-insurance: Covers Medicare Part B co-insurance and deductible.  Admin fee co-insurance: Covers Medicare Part B co-insurance and deductible.    Deductible: $226 of $226 met (covered by secondary)   Prior Auth: not required PA# Valid:    ** This summary of benefits is an estimation of the patient's out-of-pocket cost. Exact cost may vary based on individual plan coverage.

## 2022-05-04 DIAGNOSIS — M25511 Pain in right shoulder: Secondary | ICD-10-CM | POA: Diagnosis not present

## 2022-05-04 DIAGNOSIS — M67911 Unspecified disorder of synovium and tendon, right shoulder: Secondary | ICD-10-CM | POA: Diagnosis not present

## 2022-05-04 DIAGNOSIS — M67912 Unspecified disorder of synovium and tendon, left shoulder: Secondary | ICD-10-CM | POA: Diagnosis not present

## 2022-05-10 ENCOUNTER — Ambulatory Visit (INDEPENDENT_AMBULATORY_CARE_PROVIDER_SITE_OTHER): Payer: Medicare Other | Admitting: Family Medicine

## 2022-05-10 ENCOUNTER — Encounter: Payer: Self-pay | Admitting: Family Medicine

## 2022-05-10 VITALS — BP 120/70 | HR 81 | Temp 97.4°F | Ht <= 58 in | Wt 129.4 lb

## 2022-05-10 DIAGNOSIS — I10 Essential (primary) hypertension: Secondary | ICD-10-CM

## 2022-05-10 DIAGNOSIS — E785 Hyperlipidemia, unspecified: Secondary | ICD-10-CM

## 2022-05-10 DIAGNOSIS — M810 Age-related osteoporosis without current pathological fracture: Secondary | ICD-10-CM | POA: Diagnosis not present

## 2022-05-10 DIAGNOSIS — N9489 Other specified conditions associated with female genital organs and menstrual cycle: Secondary | ICD-10-CM | POA: Diagnosis not present

## 2022-05-10 DIAGNOSIS — I2510 Atherosclerotic heart disease of native coronary artery without angina pectoris: Secondary | ICD-10-CM | POA: Diagnosis not present

## 2022-05-10 DIAGNOSIS — N949 Unspecified condition associated with female genital organs and menstrual cycle: Secondary | ICD-10-CM

## 2022-05-10 DIAGNOSIS — N958 Other specified menopausal and perimenopausal disorders: Secondary | ICD-10-CM

## 2022-05-10 DIAGNOSIS — Z1283 Encounter for screening for malignant neoplasm of skin: Secondary | ICD-10-CM | POA: Diagnosis not present

## 2022-05-10 NOTE — Patient Instructions (Addendum)
Health Maintenance Due  Topic Date Due   Medicare Annual Wellness (AWV)  07/10/2020   COVID-19 Vaccine (5 - Pfizer series) 08/24/2021   INFLUENZA VACCINE  02/02/2022  Let us know when you get flu and covid shot at [place] are eligible to schedule your annual wellness visit with our nurse specialist Otila Kluver.  Please consider scheduling this before you leave today  We will call you within two weeks about your referral for ultrasound  of pelvis and to dermatology. If you do not hear within 2 weeks, give [place] a call.    can try half capful miralax one day and then full capful the next and alternate - would like to avoid straining as this could trigger hemorrhoids  Recommended follow up: Return in about 6 months (around 11/08/2022) for followup or sooner if needed.Schedule b4 you leave.

## 2022-05-10 NOTE — Progress Notes (Signed)
Phone [phone] In person visit   Subjective:   Abigail Cohen is a 81 y.o. year old very pleasant female patient who presents for/with See problem oriented charting Chief Complaint  Patient presents with   Follow-up    Wants to discuss getting booster and flu at same time.   Hyperlipidemia   bump on face    Pt stats she still has a bump on her face from bug bite in [place] in June.   Spinal Stenosis    Past Medical History-  Patient Active Problem List   Diagnosis Date Noted   nonobstructive CAD- follows with Dr. Audie Box cardiology  05/04/2021    Priority: High   Abdominal aortic aneurysm (AAA) ([place]) 08/28/2019    Priority: High   Hyperlipidemia 10/30/2020    Priority: Medium    Essential hypertension 10/30/2020    Priority: Medium    Osteoarthritis 10/30/2020    Priority: Medium    Osteoporosis 08/02/2016    Priority: Medium    Aortic atherosclerosis ([place]) 11/05/2021    Priority: Low   Emphysema lung ([place]) 11/05/2021    Priority: Low   Diverticulosis 11/05/2021    Priority: Low   Allergic rhinitis 10/30/2020    Priority: Low   Left inguinal hernia 06/20/2012    Priority: Low    Medications- reviewed and updated Current Outpatient Medications  Medication Sig Dispense Refill   acetaminophen (TYLENOL) 500 MG tablet Take 500 mg by mouth every 6 (six) hours as needed (ARTHRITIS PAIN.).     Alpha Lipoic Acid 200 MG CAPS See admin instructions.     aspirin EC 81 MG tablet Take 1 tablet (81 mg total) by mouth daily.     BENFOTIAMINE PO Take 300 mg by mouth daily.     CALCIUM CITRATE PO Take 1-2 tablets by mouth at bedtime.     Cholecalciferol (VITAMIN D3) 10 MCG (400 UNIT) tablet Take 400 Units by mouth daily.     denosumab (PROLIA) 60 MG/ML SOSY injection Inject 60 mg into the skin every 6 (six) months.     estradiol (ESTRACE) 0.1 MG/GM vaginal cream Place 1 Applicatorful vaginally 2 (two) times a week.      gabapentin (NEURONTIN) 300 MG capsule Take 300 mg by mouth  at bedtime.     ibuprofen (ADVIL) 200 MG tablet 2 tabs     Magnesium 250 MG TABS Take 250 mg by mouth daily.      metoprolol succinate (TOPROL-XL) 25 MG 24 hr tablet Take 1 tablet (25 mg total) by mouth daily. 90 tablet 3   Multiple Vitamin (MULTIVITAMIN WITH MINERALS) TABS tablet Take 1 tablet by mouth daily.     polyethylene glycol (MIRALAX / GLYCOLAX) 17 g packet 1/2 packet mixed with 8 ounces of fluid     rosuvastatin (CRESTOR) 10 MG tablet TAKE 1 TABLET(10 MG) BY MOUTH DAILY 90 tablet 3   No current facility-administered medications for this visit.     Objective:  BP 120/70   Pulse 81   Temp (!) 97.4 F (36.3 C)   Ht '4\' 10"'$  (1.473 m)   Wt 129 lb 6.4 oz (58.7 kg)   LMP  (LMP Unknown)   SpO2 95%   BMI 27.04 kg/m  Gen: NAD, resting comfortably About 1 cm osteoma over right eyebrow- appears table CV: RRR no murmurs rubs or gallops Lungs: CTAB no crackles, wheeze, rhonchi Abdomen: soft/nontender/nondistended/normal bowel sounds.  Ext: trace edema Skin: warm, dry     Assessment and Plan   #  Abdominal aortic aneurysm without rupture with history of open repair-follows with Dr. Audie Box of cardiology as well as vascular surgery S:Medication: Aspirin and statin recommended per cardiology. She is on both A/P: has been stable- scheduled for duplex 07/13/21 - continue current meds/monitoring  #Coronary artery disease based on coronary artery calcification seen on CT chest with reassuring stress test and echocardiogram-follows with Dr. Davina Poke of cardiology #hyperlipidemia- LDL goal under 70 S: Medication:Rosuvastatin 10 mg, aspirin '81mg'$  - no chest pain. Has occasional shortness of breath- per patient was told stress related per cardiology Lab Results  Component Value Date   CHOL 147 11/05/2021   HDL 74.60 11/05/2021   LDLCALC 63 11/05/2021   TRIG 46.0 11/05/2021   CHOLHDL 2 11/05/2021  A/P: LDL excellent control- continue current meds - CAD nonobstructive- would get cardiology  clearance if moves forward with shoulder surgery  #hypertension S: medication: Metoprolol succinate 25 mg twice daily BP Readings from Last 3 Encounters:  05/10/22 120/70  11/05/21 130/74  08/24/21 (!) 162/82  A/P: Controlled. Continue current medications.    # Osteoporosis- sees Dr. Renne Crigler S: Last DEXA: 06/2020 and scheduled soon  Medication (bisphosphonate or prolia):  prolia started 2018. Had been on fosamax 2015-2017 without clear benefit with plan for 1-2 year reclast at the end  Calcium: '1200mg'$  (through diet ok) recommended  Vitamin D: 1000 units a day recommended Last vitamin D-normal in February 2023 A/P: hopefully stable- update dexa soon through endocrinology. Continue current meds for now   #Pessary through ob/gyn and also on estrace cream   #Forehead polyp/supraorbital 1 cm osteoma- apperas similar on exam today   #Spinal stenosis- ultimately diagnosed with spinal stenosis- not best surgical cnadidate- 2023 accupuncture somewhat helpful Mu accupuncture and chinese herbs in 2023-referred by [facility] Dr. Jacelyn Grip   #adnexal cyst- from last ultrasound 11/17/21 " 3.8 cm diameter simple appearing cyst in RIGHT pelvis laterally, question ovarian versus paraovarian, larger than the 2.7 cm diameter cyst identified on the prior CT though remaining simple in character; Recommend followup US in 3-6 months. Note: This recommendation does not apply to premenarchal patients or to those with increased risk (genetic, family history, elevated tumor markers or other high-risk factors) of ovarian cancer. " - order repeat at this time   Electronically Signed  #Spinal stenosis- ultimately diagnosed with spinal stenosis- not best surgical cnadidate- 2023 accupuncture somewhat helpful Mu accupuncture and chinese herbs in 2023-referred by [facility] Dr. Jacelyn Grip   #Neck pain issues saw [facility] 04/29/22- also getting some pain into right hand- has had some acupuncture in  scalp- also wearing wrist brace and helpful.   #skin cancer screening- has never been we opted to do a screening- had some spots possible bug bites on cheeks with residual inflammation- she will ask about this.   #constipation- 1/2 capful and drinking 60 oz each day and eating high fiber. Can be 3-4 days between bowel movement. No blood in stool. No melena. Stools are soft when come out- occasionally strains- can try half capful miralax one day and then full capful the next and alternate - would like to avoid straining as this could trigger hemorrhoids  Recommended follow up: Return in about 6 months (around 11/08/2022) for followup or sooner if needed.Schedule b4 you leave. Future Appointments  Date Time Provider [place]  05/18/2022  1:30 PM [facility] NURSE [facility] None  05/24/2022  9:00 AM [facility] NL VASC 2 [facility]SE[facility]I [facility]  06/24/2022  1:00 PM [facility] DX DEXA 1 [facility]  08/19/2022  1:00 PM Philemon Kingdom, MD [facility] None  10/21/2022  1:15 PM Hyatt, Romilda Garret, DPM [facility]   Lab/Order associations:   ICD-10-CM   1. Adnexal cyst  N94.9 US PELVIC COMPLETE WITH TRANSVAGINAL    2. Simple adnexal cyst less than 1 cm in diameter in postmenopausal patient  N94.89 US PELVIC COMPLETE WITH TRANSVAGINAL   N95.8     3. Essential hypertension  I10     4. Hyperlipidemia, unspecified hyperlipidemia type  E78.5     5. Osteoporosis without current pathological fracture, unspecified osteoporosis type  M81.0     6. Coronary artery disease involving native coronary artery of native heart without angina pectoris  I25.10     7. Screening for skin cancer  Z12.83 Ambulatory referral to Dermatology      No orders of the defined types were placed in this encounter.   Return precautions advised.  Garret Reddish, MD

## 2022-05-18 ENCOUNTER — Ambulatory Visit (INDEPENDENT_AMBULATORY_CARE_PROVIDER_SITE_OTHER): Payer: Medicare Other

## 2022-05-18 DIAGNOSIS — Z23 Encounter for immunization: Secondary | ICD-10-CM

## 2022-05-18 DIAGNOSIS — M810 Age-related osteoporosis without current pathological fracture: Secondary | ICD-10-CM | POA: Diagnosis not present

## 2022-05-18 MED ORDER — DENOSUMAB 60 MG/ML ~~LOC~~ SOSY
60.0000 mg | PREFILLED_SYRINGE | Freq: Once | SUBCUTANEOUS | Status: AC
  Administered 2022-05-18: 60 mg via SUBCUTANEOUS

## 2022-05-18 NOTE — Progress Notes (Signed)
Patient verbally confirmed name, date of birth, and correct medication to be administered. Prolia injection administered and pt tolerated well. Pt also requested flu vaccine. High dose vaccine was administered as well.

## 2022-05-19 ENCOUNTER — Ambulatory Visit
Admission: RE | Admit: 2022-05-19 | Discharge: 2022-05-19 | Disposition: A | Discharge status: Home / Self Care | Payer: Medicare Other | Source: Ambulatory Visit | Attending: Family Medicine | Admitting: Family Medicine

## 2022-05-19 DIAGNOSIS — N83201 Unspecified ovarian cyst, right side: Secondary | ICD-10-CM | POA: Diagnosis not present

## 2022-05-19 DIAGNOSIS — N958 Other specified menopausal and perimenopausal disorders: Secondary | ICD-10-CM

## 2022-05-19 DIAGNOSIS — N949 Unspecified condition associated with female genital organs and menstrual cycle: Secondary | ICD-10-CM

## 2022-05-20 ENCOUNTER — Telehealth: Payer: Self-pay | Admitting: Family Medicine

## 2022-05-20 NOTE — Telephone Encounter (Signed)
Patient states she had her Initial Medicare AWV done when she was a Patient at [facility] years ago-the last one that was done at [facility] with Dr. Lorenda Hatchet.

## 2022-05-21 ENCOUNTER — Ambulatory Visit: Payer: Medicare Other

## 2022-05-21 ENCOUNTER — Other Ambulatory Visit: Payer: Self-pay | Admitting: *Deleted

## 2022-05-21 DIAGNOSIS — N949 Unspecified condition associated with female genital organs and menstrual cycle: Secondary | ICD-10-CM

## 2022-05-24 ENCOUNTER — Ambulatory Visit (HOSPITAL_COMMUNITY)
Admission: RE | Admit: 2022-05-24 | Discharge: 2022-05-24 | Disposition: A | Discharge status: Home / Self Care | Payer: Medicare Other | Source: Ambulatory Visit | Attending: Internal Medicine | Admitting: Internal Medicine

## 2022-05-24 ENCOUNTER — Other Ambulatory Visit (HOSPITAL_COMMUNITY): Payer: Medicare Other

## 2022-05-24 DIAGNOSIS — I714 Abdominal aortic aneurysm, without rupture, unspecified: Secondary | ICD-10-CM | POA: Insufficient documentation

## 2022-05-24 DIAGNOSIS — I7143 Infrarenal abdominal aortic aneurysm, without rupture: Secondary | ICD-10-CM | POA: Diagnosis not present

## 2022-05-24 DIAGNOSIS — Z48812 Encounter for surgical aftercare following surgery on the circulatory system: Secondary | ICD-10-CM | POA: Diagnosis not present

## 2022-05-24 DIAGNOSIS — M19211 Secondary osteoarthritis, right shoulder: Secondary | ICD-10-CM | POA: Diagnosis not present

## 2022-05-24 NOTE — Telephone Encounter (Signed)
Patient requests to be called at ph # [phone] or sent a MyChart re: status of change of appointment type from initial which Patient states she already has done

## 2022-05-25 NOTE — Telephone Encounter (Signed)
Patient was concerned about the AWVI being scheduled as AWVI states she has had initial visit for this in the past.. I explained to her the information we go by is from [facility] site and that site is not showing she has had AWVI was not due until 09-02-21. Patient understood.

## 2022-05-31 ENCOUNTER — Ambulatory Visit (INDEPENDENT_AMBULATORY_CARE_PROVIDER_SITE_OTHER): Payer: Medicare Other

## 2022-05-31 VITALS — BP 122/78 | HR 99 | Temp 98.2°F | Wt 130.4 lb

## 2022-05-31 DIAGNOSIS — Z0000 Encounter for general adult medical examination without abnormal findings: Secondary | ICD-10-CM | POA: Diagnosis not present

## 2022-05-31 NOTE — Patient Instructions (Signed)
Abigail Cohen , Thank you for taking time to come for your Medicare Wellness Visit. I appreciate your ongoing commitment to your health goals. Please review the following plan we discussed and let me know if I can assist you in the future.   These are the goals we discussed:  Goals   None     This is a list of the screening recommended for you and due dates:  Health Maintenance  Topic Date Due   COVID-19 Vaccine (5 - 2023-24 season) 03/05/2022   Medicare Annual Wellness Visit  06/01/2023   Pneumonia Vaccine  Completed   Flu Shot  Completed   DEXA scan (bone density measurement)  Completed   Zoster (Shingles) Vaccine  Completed   HPV Vaccine  Aged Out    Advanced directives: Please bring a copy of your health care power of attorney and living will to the office at your convenience.  Conditions/risks identified: Go back to exercise to walk more   Next appointment: Follow up in one year for your annual wellness visit    Preventive Care 65 Years and Older, Female Preventive care refers to lifestyle choices and visits with your health care provider that can promote health and wellness. What does preventive care include? A yearly physical exam. This is also called an annual well check. Dental exams once or twice a year. Routine eye exams. Ask your health care provider how often you should have your eyes checked. Personal lifestyle choices, including: Daily care of your teeth and gums. Regular physical activity. Eating a healthy diet. Avoiding tobacco and drug use. Limiting alcohol use. Practicing safe sex. Taking low-dose aspirin every day. Taking vitamin and mineral supplements as recommended by your health care provider. What happens during an annual well check? The services and screenings done by your health care provider during your annual well check will depend on your age, overall health, lifestyle risk factors, and family history of disease. Counseling  Your health care  provider may ask you questions about your: Alcohol use. Tobacco use. Drug use. Emotional well-being. Home and relationship well-being. Sexual activity. Eating habits. History of falls. Memory and ability to understand (cognition). Work and work Statistician. Reproductive health. Screening  You may have the following tests or measurements: Height, weight, and BMI. Blood pressure. Lipid and cholesterol levels. These may be checked every 5 years, or more frequently if you are over 47 years old. Skin check. Lung cancer screening. You may have this screening every year starting at age 48 if you have a 30-pack-year history of smoking and currently smoke or have quit within the past 15 years. Fecal occult blood test (FOBT) of the stool. You may have this test every year starting at age 9. Flexible sigmoidoscopy or colonoscopy. You may have a sigmoidoscopy every 5 years or a colonoscopy every 10 years starting at age 20. Hepatitis C blood test. Hepatitis B blood test. Sexually transmitted disease (STD) testing. Diabetes screening. This is done by checking your blood sugar (glucose) after you have not eaten for a while (fasting). You may have this done every 1-3 years. Bone density scan. This is done to screen for osteoporosis. You may have this done starting at age 79. Mammogram. This may be done every 1-2 years. Talk to your health care provider about how often you should have regular mammograms. Talk with your health care provider about your test results, treatment options, and if necessary, the need for more tests. Vaccines  Your health care provider may recommend  certain vaccines, such as: Influenza vaccine. This is recommended every year. Tetanus, diphtheria, and acellular pertussis (Tdap, Td) vaccine. You may need a Td booster every 10 years. Zoster vaccine. You may need this after age 48. Pneumococcal 13-valent conjugate (PCV13) vaccine. One dose is recommended after age  49. Pneumococcal polysaccharide (PPSV23) vaccine. One dose is recommended after age 72. Talk to your health care provider about which screenings and vaccines you need and how often you need them. This information is not intended to replace advice given to you by your health care provider. Make sure you discuss any questions you have with your health care provider. Document Released: 07/18/2015 Document Revised: 03/10/2016 Document Reviewed: 04/22/2015 Elsevier Interactive Patient Education  2017 [place] Prevention in the Home Falls can cause injuries. They can happen to people of all ages. There are many things you can do to make your home safe and to help prevent falls. What can I do on the outside of my home? Regularly fix the edges of walkways and driveways and fix any cracks. Remove anything that might make you trip as you walk through a door, such as a raised step or threshold. Trim any bushes or trees on the path to your home. Use bright outdoor lighting. Clear any walking paths of anything that might make someone trip, such as rocks or tools. Regularly check to see if handrails are loose or broken. Make sure that both sides of any steps have handrails. Any raised decks and porches should have guardrails on the edges. Have any leaves, snow, or ice cleared regularly. Use sand or salt on walking paths during winter. Clean up any spills in your garage right away. This includes oil or grease spills. What can I do in the bathroom? Use night lights. Install grab bars by the toilet and in the tub and shower. Do not use towel bars as grab bars. Use non-skid mats or decals in the tub or shower. If you need to sit down in the shower, use a plastic, non-slip stool. Keep the floor dry. Clean up any water that spills on the floor as soon as it happens. Remove soap buildup in the tub or shower regularly. Attach bath mats securely with double-sided non-slip rug tape. Do not have throw  rugs and other things on the floor that can make you trip. What can I do in the bedroom? Use night lights. Make sure that you have a light by your bed that is easy to reach. Do not use any sheets or blankets that are too big for your bed. They should not hang down onto the floor. Have a firm chair that has side arms. You can use this for support while you get dressed. Do not have throw rugs and other things on the floor that can make you trip. What can I do in the kitchen? Clean up any spills right away. Avoid walking on wet floors. Keep items that you use a lot in easy-to-reach places. If you need to reach something above you, use a strong step stool that has a grab bar. Keep electrical cords out of the way. Do not use floor polish or wax that makes floors slippery. If you must use wax, use non-skid floor wax. Do not have throw rugs and other things on the floor that can make you trip. What can I do with my stairs? Do not leave any items on the stairs. Make sure that there are handrails on both sides of the  stairs and use them. Fix handrails that are broken or loose. Make sure that handrails are as long as the stairways. Check any carpeting to make sure that it is firmly attached to the stairs. Fix any carpet that is loose or worn. Avoid having throw rugs at the top or bottom of the stairs. If you do have throw rugs, attach them to the floor with carpet tape. Make sure that you have a light switch at the top of the stairs and the bottom of the stairs. If you do not have them, ask someone to add them for you. What else can I do to help prevent falls? Wear shoes that: Do not have high heels. Have rubber bottoms. Are comfortable and fit you well. Are closed at the toe. Do not wear sandals. If you use a stepladder: Make sure that it is fully opened. Do not climb a closed stepladder. Make sure that both sides of the stepladder are locked into place. Ask someone to hold it for you, if  possible. Clearly mark and make sure that you can see: Any grab bars or handrails. First and last steps. Where the edge of each step is. Use tools that help you move around (mobility aids) if they are needed. These include: Canes. Walkers. Scooters. Crutches. Turn on the lights when you go into a dark area. Replace any light bulbs as soon as they burn out. Set up your furniture so you have a clear path. Avoid moving your furniture around. If any of your floors are uneven, fix them. If there are any pets around you, be aware of where they are. Review your medicines with your doctor. Some medicines can make you feel dizzy. This can increase your chance of falling. Ask your doctor what other things that you can do to help prevent falls. This information is not intended to replace advice given to you by your health care provider. Make sure you discuss any questions you have with your health care provider. Document Released: 04/17/2009 Document Revised: 11/27/2015 Document Reviewed: 07/26/2014 Elsevier Interactive Patient Education  2017 [employer].

## 2022-05-31 NOTE — Progress Notes (Signed)
Subjective:   Abigail Cohen is a 81 y.o. female who presents for an Initial Medicare Annual Wellness Visit.  Review of Systems     Cardiac Risk Factors include: advanced age (>48mn, >>39women);dyslipidemia;hypertension     Objective:    Today's Vitals   05/31/22 1259  BP: 122/78  Pulse: 99  Temp: 98.2 F (36.8 C)  SpO2: 99%  Weight: 130 lb 6.4 oz (59.1 kg)   Body mass index is 27.25 kg/m.     05/31/2022    1:12 PM 07/01/2021   10:33 AM 11/21/2019    1:10 PM 08/31/2019    7:00 AM 08/27/2019    9:23 AM 07/18/2019    8:56 AM 06/27/2019    9:49 AM  Advanced Directives  Does Patient Have a Medical Advance Directive? Yes No Yes Yes Yes Yes Yes  Type of AParamedicof ALorainLiving will  HBellmawrLiving will HTanacrossLiving will HRiversideLiving will [place]uchulaLiving will HCottonwoodLiving will  Does patient want to make changes to medical advance directive?   No - Patient declined No - Patient declined  No - Patient declined No - Patient declined  Copy of HLimestonein Chart? No - copy requested    No - copy requested    Would patient like information on creating a medical advance directive?  No - Patient declined         Current Medications (verified) Outpatient Encounter Medications as of 05/31/2022  Medication Sig   acetaminophen (TYLENOL) 500 MG tablet Take 500 mg by mouth every 6 (six) hours as needed (ARTHRITIS PAIN.).   Alpha Lipoic Acid 200 MG CAPS See admin instructions.   aspirin EC 81 MG tablet Take 1 tablet (81 mg total) by mouth daily.   BENFOTIAMINE PO Take 300 mg by mouth daily.   CALCIUM CITRATE PO Take 1-2 tablets by mouth at bedtime.   Cholecalciferol (VITAMIN D3) 10 MCG (400 UNIT) tablet Take 400 Units by mouth daily.   denosumab (PROLIA) 60 MG/ML SOSY injection Inject 60 mg into the skin every 6 (six) months.    estradiol (ESTRACE) 0.1 MG/GM vaginal cream Place 1 Applicatorful vaginally 2 (two) times a week.    gabapentin (NEURONTIN) 300 MG capsule Take 300 mg by mouth at bedtime.   ibuprofen (ADVIL) 200 MG tablet 2 tabs   Magnesium 250 MG TABS Take 250 mg by mouth daily.    metoprolol succinate (TOPROL-XL) 25 MG 24 hr tablet Take 1 tablet (25 mg total) by mouth daily.   Multiple Vitamin (MULTIVITAMIN WITH MINERALS) TABS tablet Take 1 tablet by mouth daily.   polyethylene glycol (MIRALAX / GLYCOLAX) 17 g packet 1/2 packet mixed with 8 ounces of fluid   rosuvastatin (CRESTOR) 10 MG tablet TAKE 1 TABLET(10 MG) BY MOUTH DAILY   No facility-administered encounter medications on file as of 05/31/2022.    Allergies (verified) Penicillins   History: Past Medical History:  Diagnosis Date   AAA (abdominal aortic aneurysm) ([facility]    Allergy    Essential hypertension h   Family history of anesthesia complication    daughter gets nauseated   Hearing loss    wears hearing aids   Hypertension    Knee joint replacement status    Osteoarthritis    Osteopenia    Osteoporosis    Presence of intracervical pessary    Uterine prolapse    Wears glasses  Wears partial dentures    upper and lower partial   Past Surgical History:  Procedure Laterality Date   ABDOMINAL AORTIC ANEURYSM REPAIR N/A 08/28/2019   Procedure: ANEURYSM ABDOMINAL AORTIC REPAIR OPEN;  Surgeon: Angelia Mould, MD;  Location: [facility];  Service: Vascular;  Laterality: N/A;   COLONOSCOPY     EYE SURGERY     cataracts removal   HERNIA REPAIR  07/07/12   Dr Aretha Parrot HERNIA REPAIR  07/07/2012   Procedure: HERNIA REPAIR INGUINAL ADULT;  Surgeon: Haywood Lasso, MD;  Location: [facility];  Service: General;  Laterality: Left;  left inguinal area   REPLACEMENT TOTAL KNEE BILATERAL  2003   TONSILLECTOMY  1945   WISDOM TOOTH EXTRACTION     Family History  Problem Relation Age of Onset   Heart disease  Mother    Heart attack Mother        15s   Arrhythmia Mother    Heart disease Father        22s   Heart attack Father    Cancer Sister        endometrial   Social History   Socioeconomic History   Marital status: Divorced    Spouse name: Not on file   Number of children: 2   Years of education: Not on file   Highest education level: Not on file  Occupational History   Occupation: retired Arts development officer  Tobacco Use   Smoking status: Former    Years: 35.00    Types: Cigarettes    Start date: 07/05/2004    Quit date: 06/30/2005    Years since quitting: 16.9   Smokeless tobacco: Never  Vaping Use   Vaping Use: Never used  Substance and Sexual Activity   Alcohol use: Yes    Alcohol/week: 7.0 standard drinks of alcohol    Types: 7 Glasses of wine per week   Drug use: No   Sexual activity: Not Currently    Birth control/protection: Post-menopausal  Other Topics Concern   Not on file  Social History Narrative   Divorced- ex husband now deceased. Lives alone in townhome.    Daughter lives in town, son lives in town.       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      Hobbies: enjoys travel, Nurse, children's, music, dance, Oncologist    Social Determinants of Health   Financial Resource Strain: [place]  (05/31/2022)   Overall Financial Resource Strain (CARDIA)    Difficulty of Paying Living Expenses: Not hard at all  Food Insecurity: No Food Insecurity (05/31/2022)   Hunger Vital Sign    Worried About Running Out of Food in the Last Year: Never true    [place] in the Last Year: Never true  Transportation Needs: No Transportation Needs (05/31/2022)   PRAPARE - Hydrologist (Medical): No    Lack of Transportation (Non-Medical): No  Physical Activity: Sufficiently Active (05/31/2022)   Exercise Vital Sign    Days of Exercise per Week: 6 days    Minutes of Exercise per Session: 30 min  Stress: No Stress Concern  Present (05/31/2022)   [place]    Feeling of Stress : Not at all  Social Connections: Socially Isolated (05/31/2022)   Social Connection and Isolation Panel [NHANES]    Frequency of Communication with Friends and Family: More than  three times a week    Frequency of Social Gatherings with Friends and Family: More than three times a week    Attends Religious Services: Never    Marine scientist or Organizations: No    Attends Music therapist: Never    Marital Status: Divorced    Tobacco Counseling Counseling given: Not Answered   Clinical Intake:  Pre-visit preparation completed: Yes  Pain : No/denies pain     BMI - recorded: 27.25 Nutritional Status: BMI 25 -29 Overweight Nutritional Risks: None Diabetes: No  How often do you need to have someone help you when you read instructions, pamphlets, or other written materials from your doctor or pharmacy?: 1 - Never  Diabetic?no  Interpreter Needed?: No  Information entered by :: Charlott Rakes, LPN   Activities of Daily Living    05/31/2022    1:13 PM 05/29/2022    3:29 AM  In your present state of health, do you have any difficulty performing the following activities:  Hearing? 1 0  Comment hearing aids   Vision? 0 0  Difficulty concentrating or making decisions? 0 0  Walking or climbing stairs? 0 0  Dressing or bathing? 0 0  Doing errands, shopping? 0 0  Preparing Food and eating ? N N  Using the Toilet? N N  In the past six months, have you accidently leaked urine? N N  Do you have problems with loss of bowel control? N N  Managing your Medications? N N  Managing your Finances? N N  Housekeeping or managing your Housekeeping? N N    Patient Care Team: Marin Olp, MD as PCP - General (Family Medicine) O'Neal, Cassie Freer, MD as PCP - Cardiology (Cardiology)  Indicate any recent Medical Services you may have  received from other than [facility] providers in the past year (date may be approximate).     Assessment:   This is a routine wellness examination for [place].  Hearing/Vision screen Hearing Screening - Comments:: Pt wears a hearing aids  Vision Screening - Comments:: Pt follows up with Dr Charlene Brooke for annul eye exams   Dietary issues and exercise activities discussed: Current Exercise Habits: Home exercise routine, Type of exercise: Other - see comments, Time (Minutes): 30, Frequency (Times/Week): 6, Weekly Exercise (Minutes/Week): 180   Goals Addressed             This Visit's Progress    Patient Stated       Go back to exercises to walk more        Depression Screen    05/31/2022    1:09 PM 05/10/2022    1:02 PM 05/04/2021    1:33 PM 10/30/2020    1:22 PM  PHQ 2/9 Scores  PHQ - 2 Score 0 0 0 0  PHQ- 9 Score 0 0  0    Fall Risk    05/31/2022    1:13 PM 05/29/2022    3:29 AM 11/05/2021    2:01 PM 05/04/2021    1:33 PM 10/30/2020    1:21 PM  [place] in the past year? 0 0 0 0 0  Number falls in past yr: 0  0 0 0  Injury with Fall? 0  0 0 0  Risk for fall due to : Impaired balance/gait;Impaired vision  No Fall Risks    Follow up Falls prevention discussed  Falls evaluation completed Falls evaluation completed     [place]  TO THE HOME:  Any stairs in or around the home? Yes  If so, are there any without handrails? No  Home free of loose throw rugs in walkways, pet beds, electrical cords, etc? Yes  Adequate lighting in your home to reduce risk of falls? Yes   ASSISTIVE DEVICES UTILIZED TO PREVENT FALLS:  Life alert? No  Use of a cane, walker or w/c? Yes  Grab bars in the bathroom? Yes  Shower chair or bench in shower? Yes  Elevated toilet seat or a handicapped toilet? Yes   TIMED UP AND GO:  Was the test performed? Yes .  Length of time to ambulate 10 feet: 20 sec.   Gait steady and fast with assistive device  Cognitive  Function:        05/31/2022    1:14 PM  6CIT Screen  What Year? 0 points  What month? 0 points  What time? 0 points  Count back from 20 0 points  Months in reverse 0 points  Repeat phrase 0 points  Total Score 0 points    Immunizations Immunization History  Administered Date(s) Administered   Fluad Quad(high Dose 65+) 04/23/2021, 05/18/2022   Influenza Split 05/18/2007, 06/07/2012, 04/19/2019, 04/21/2020   Influenza, High Dose Seasonal PF 04/25/2013, 07/06/2014, 04/22/2015, 04/28/2016, 04/29/2017, 05/02/2018, 04/19/2019   PFIZER(Purple Top)SARS-COV-2 Vaccination 07/14/2019, 08/04/2019, 03/21/2020   Pfizer Covid-19 Vaccine Bivalent Booster 44yr & up 04/23/2021, 12/23/2021   Pneumococcal Conjugate-13 04/22/2015   Pneumococcal Polysaccharide-23 08/02/2006   Tdap 02/03/2011, 05/22/2021   Zoster Recombinat (Shingrix) 05/08/2021, 07/14/2021   Zoster, Live 02/03/2011   Zoster, Unspecified 02/03/2011    TDAP status: Up to date  Flu Vaccine status: Up to date  Pneumococcal vaccine status: Up to date  Covid-19 vaccine status: Completed vaccines  Qualifies for Shingles Vaccine? Yes   Zostavax completed Yes   Shingrix Completed?: Yes  Screening Tests Health Maintenance  Topic Date Due   COVID-19 Vaccine (6 - 2023-24 season) 03/05/2022   Medicare Annual Wellness (AWV)  06/01/2023   Pneumonia Vaccine 81 Years old  Completed   INFLUENZA VACCINE  Completed   DEXA SCAN  Completed   Zoster Vaccines- Shingrix  Completed   HPV VACCINES  Aged Out    Health Maintenance  Health Maintenance Due  Topic Date Due   COVID-19 Vaccine (6 - 2023-24 season) 03/05/2022    Colorectal cancer screening: No longer required.   Mammogram status: Completed 03/22/22. Repeat every year  Bone Density status: Completed 06/23/20. Results reflect: Bone density results: OSTEOPOROSIS. Repeat every pt stated scheduled 06/24/22 years.   Additional Screening:   Vision Screening: Recommended  annual ophthalmology exams for early detection of glaucoma and other disorders of the eye. Is the patient up to date with their annual eye exam?  Yes  Who is the provider or what is the name of the office in which the patient attends annual eye exams? Dr SCharlene Brooke If pt is not established with a provider, would they like to be referred to a provider to establish care? No .   Dental Screening: Recommended annual dental exams for proper oral hygiene  Community Resource Referral / Chronic Care Management: CRR required this visit?  No   CCM required this visit?  No      Plan:     I have personally reviewed and noted the following in the patient's chart:   Medical and social history Use of alcohol, tobacco or illicit drugs  Current medications and supplements including opioid prescriptions.  Patient is not currently taking opioid prescriptions. Functional ability and status Nutritional status Physical activity Advanced directives List of other physicians Hospitalizations, surgeries, and ER visits in previous 12 months Vitals Screenings to include cognitive, depression, and falls Referrals and appointments  In addition, I have reviewed and discussed with patient certain preventive protocols, quality metrics, and best practice recommendations. A written personalized care plan for preventive services as well as general preventive health recommendations were provided to patient.     Willette Brace, LPN   [id number]/04/[id number]   Nurse Notes: none

## 2022-06-03 DIAGNOSIS — Z23 Encounter for immunization: Secondary | ICD-10-CM | POA: Diagnosis not present

## 2022-06-14 ENCOUNTER — Encounter: Payer: Self-pay | Admitting: Family Medicine

## 2022-06-18 ENCOUNTER — Other Ambulatory Visit: Payer: Self-pay | Admitting: Cardiovascular Disease

## 2022-06-19 NOTE — Telephone Encounter (Signed)
Last Prolia inj 05/18/22 Next Prolia inj due 11/17/22

## 2022-06-21 DIAGNOSIS — N9489 Other specified conditions associated with female genital organs and menstrual cycle: Secondary | ICD-10-CM | POA: Diagnosis not present

## 2022-06-21 DIAGNOSIS — Z4689 Encounter for fitting and adjustment of other specified devices: Secondary | ICD-10-CM | POA: Diagnosis not present

## 2022-06-21 DIAGNOSIS — N83201 Unspecified ovarian cyst, right side: Secondary | ICD-10-CM | POA: Diagnosis not present

## 2022-06-24 ENCOUNTER — Ambulatory Visit
Admission: RE | Admit: 2022-06-24 | Discharge: 2022-06-24 | Disposition: A | Discharge status: Home / Self Care | Payer: Medicare Other | Source: Ambulatory Visit | Attending: Obstetrics and Gynecology | Admitting: Obstetrics and Gynecology

## 2022-06-24 ENCOUNTER — Other Ambulatory Visit: Payer: Medicare Other

## 2022-06-24 DIAGNOSIS — M8580 Other specified disorders of bone density and structure, unspecified site: Secondary | ICD-10-CM

## 2022-06-24 DIAGNOSIS — M85852 Other specified disorders of bone density and structure, left thigh: Secondary | ICD-10-CM | POA: Diagnosis not present

## 2022-06-24 DIAGNOSIS — M810 Age-related osteoporosis without current pathological fracture: Secondary | ICD-10-CM | POA: Diagnosis not present

## 2022-06-24 DIAGNOSIS — Z780 Asymptomatic menopausal state: Secondary | ICD-10-CM | POA: Diagnosis not present

## 2022-07-09 IMAGING — US US PELVIS COMPLETE WITH TRANSVAGINAL
1 series · 13 of 25 positions shown · non-contrast
Comparison: CT abdomen and pelvis 07/02/2019

CLINICAL DATA: Adnexal cyst, follow-up, postmenopausal

EXAM:
TRANSABDOMINAL AND TRANSVAGINAL ULTRASOUND OF PELVIS
TECHNIQUE: Both transabdominal and transvaginal ultrasound examinations of the
pelvis were performed. Transabdominal technique was performed for
global imaging of the pelvis including uterus, ovaries, adnexal
regions, and pelvic cul-de-sac. It was necessary to proceed with
endovaginal exam following the transabdominal exam to visualize the
uterus and ovaries.

[Series 1: us pelvis complete with transvaginal · 0.25mm/px · 45 acquisitions, 13 frames shown]
[im 1/45]
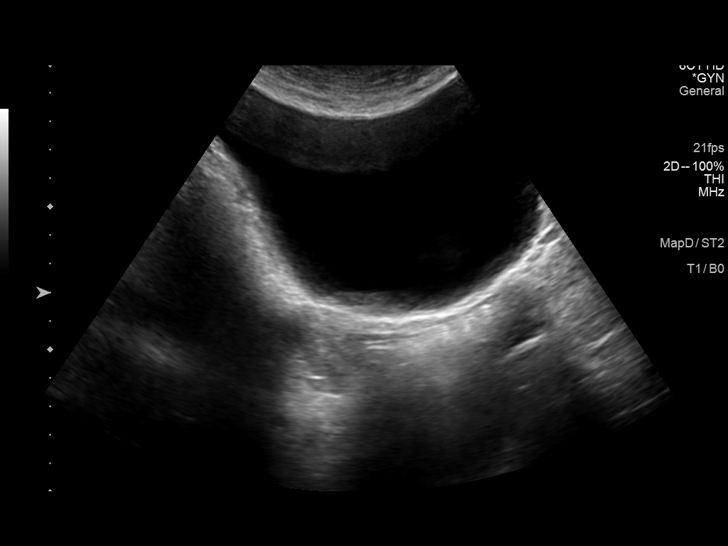
[im 4/45]
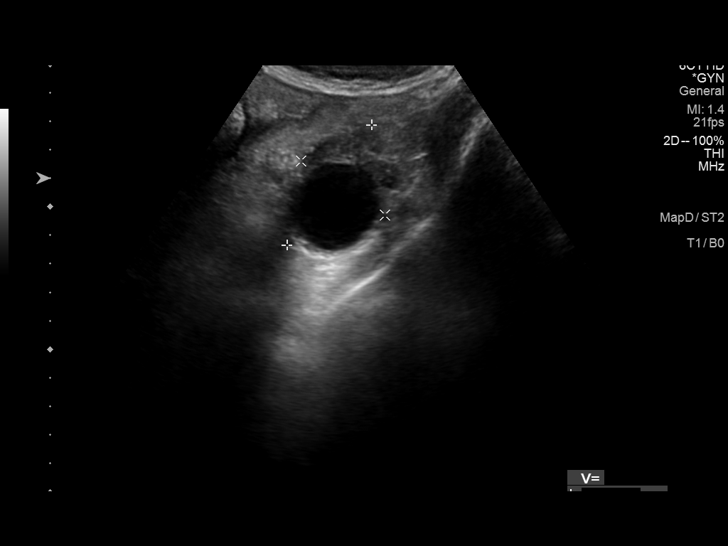
[im 8/45]
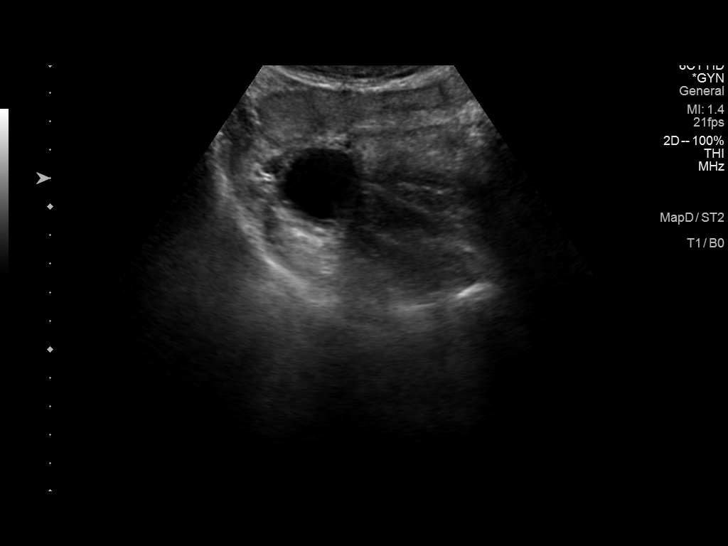
[im 12/45]
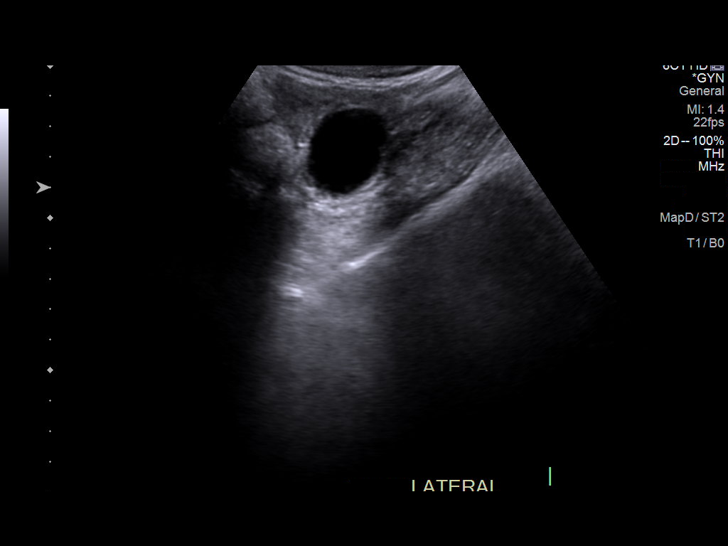
[im 15/45]
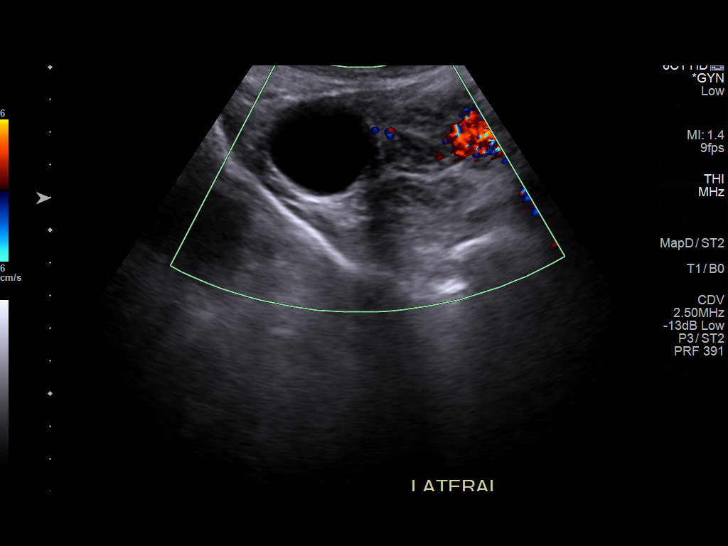
[im 19/45]
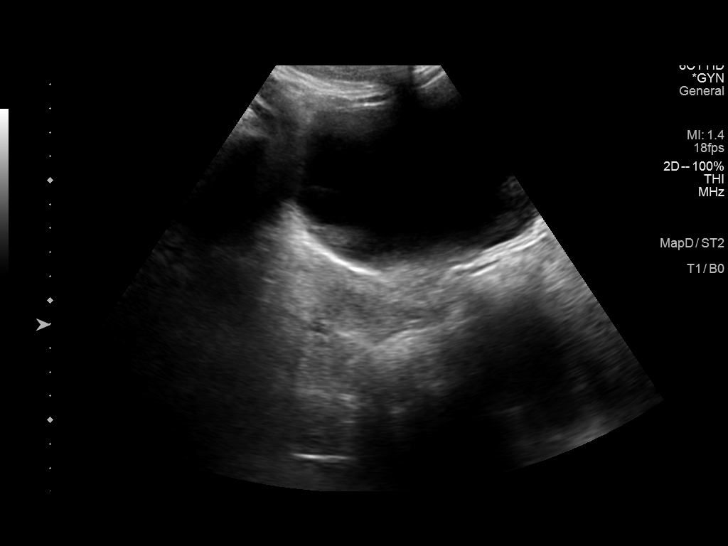
[im 23/45]
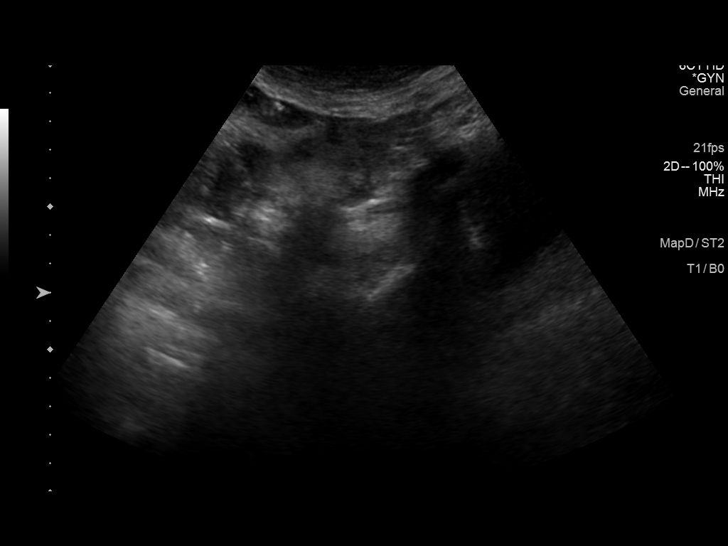
[im 26/45]
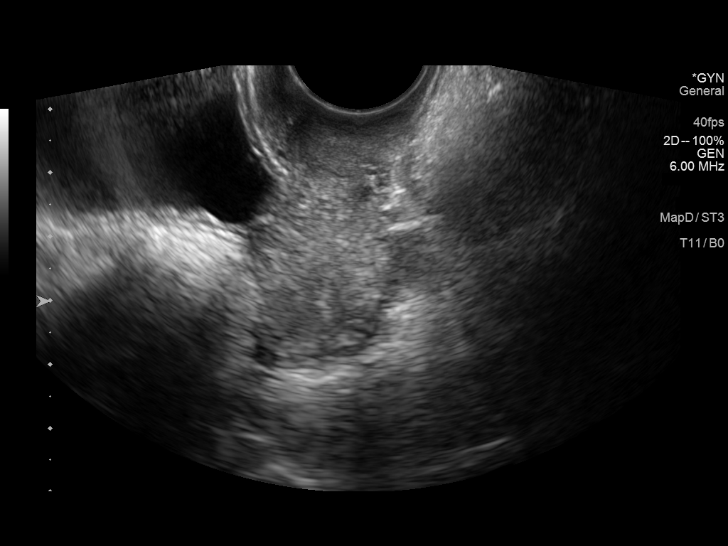
[im 30/45]
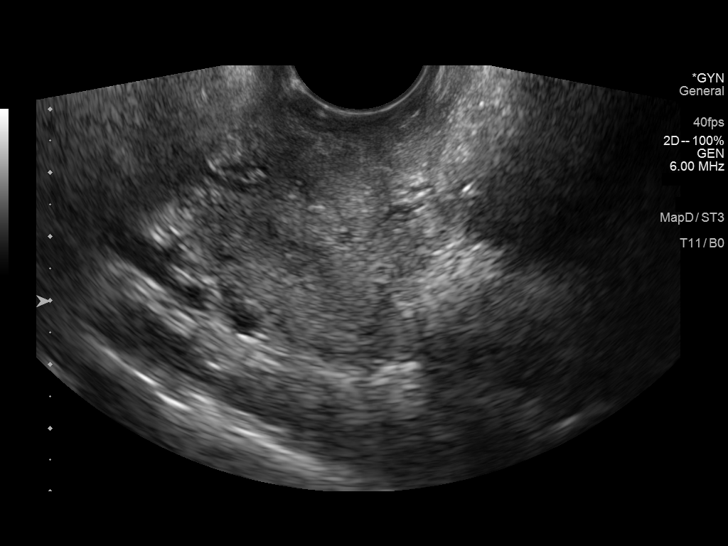
[im 34/45]
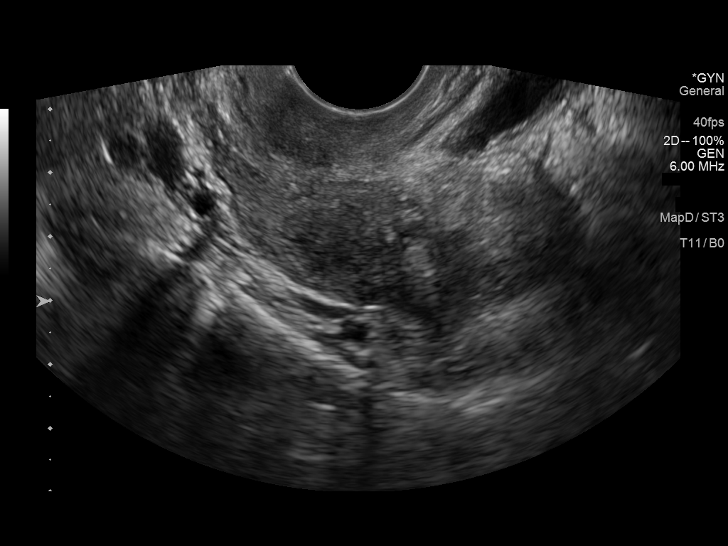
[im 37/45]
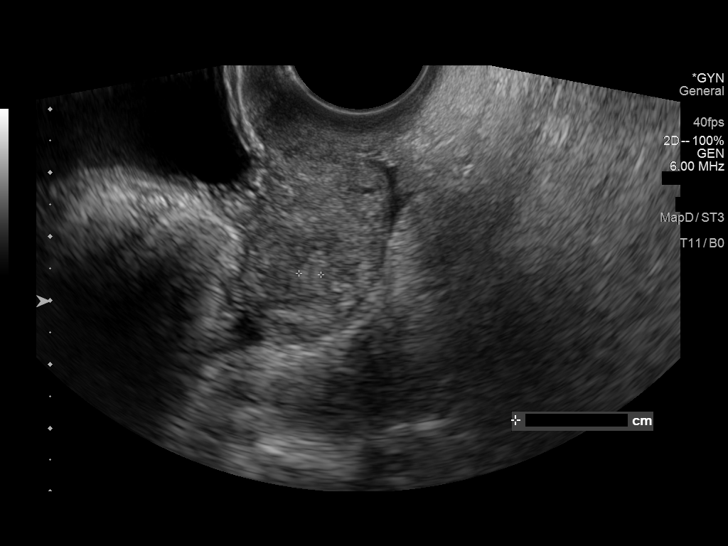
[im 41/45]
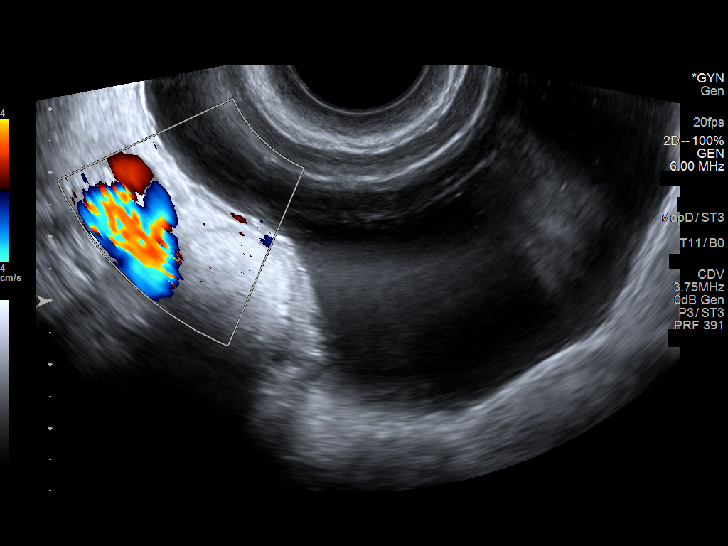
[im 45/45]
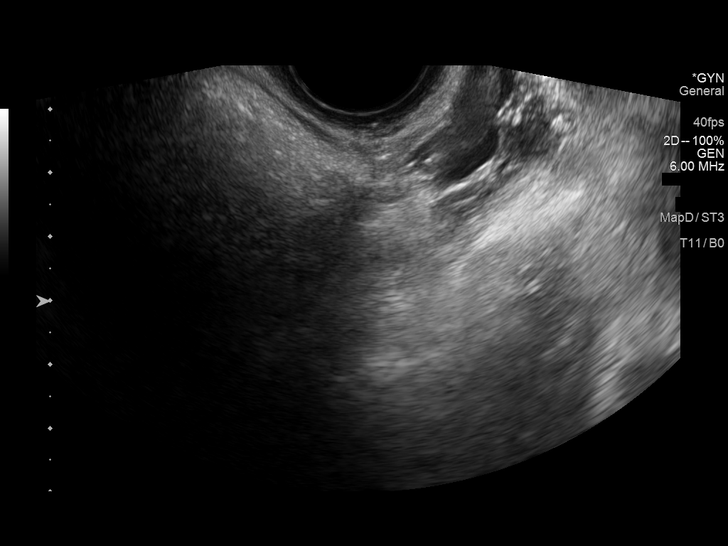

[13 of 25 positions shown; findings below may reference images not displayed]

FINDINGS: Uterus

Measurements: 5.4 x 2.6 x 3.1 cm = volume: 23 mL. Anteverted. Normal
morphology without mass

Endometrium

Thickness: 4 mm.  No endometrial fluid or mass

Right ovary

No normal appearing RIGHT ovary visualized, see below

Left ovary

Not visualized, likely obscured by bowel

Other findings

Simple cyst identified in RIGHT pelvis laterally, 3.8 x 3.0 x
cm, question ovarian versus paraovarian, no normal appearing ovarian
tissue identified. No free pelvic fluid. No additional masses.
IMPRESSION: Unremarkable uterus and endometrial complex.

3.8 cm diameter simple appearing cyst in RIGHT pelvis laterally,
question ovarian versus paraovarian, larger than the 2.7 cm diameter
cyst identified on the prior CT though remaining simple in
character; Recommend followup US in 3-6 months. Note: This
recommendation does not apply to premenarchal patients or to those
with increased risk (genetic, family history, elevated tumor markers
or other high-risk factors) of ovarian cancer. Reference: Radiology
[DATE]):[id number].

## 2022-07-22 DIAGNOSIS — N9489 Other specified conditions associated with female genital organs and menstrual cycle: Secondary | ICD-10-CM | POA: Diagnosis not present

## 2022-08-19 ENCOUNTER — Ambulatory Visit (INDEPENDENT_AMBULATORY_CARE_PROVIDER_SITE_OTHER): Payer: Medicare Other | Admitting: Internal Medicine

## 2022-08-19 ENCOUNTER — Encounter: Payer: Self-pay | Admitting: Internal Medicine

## 2022-08-19 VITALS — BP 124/80 | HR 96 | Ht <= 58 in | Wt 128.6 lb

## 2022-08-19 DIAGNOSIS — G6289 Other specified polyneuropathies: Secondary | ICD-10-CM

## 2022-08-19 DIAGNOSIS — M810 Age-related osteoporosis without current pathological fracture: Secondary | ICD-10-CM | POA: Diagnosis not present

## 2022-08-19 LAB — BASIC METABOLIC PANEL
BUN: 30 mg/dL — ABNORMAL HIGH (ref 6–23)
CO2: 29 mEq/L (ref 19–32)
Calcium: 9.5 mg/dL (ref 8.4–10.5)
Chloride: 103 mEq/L (ref 96–112)
Creatinine, Ser: 0.73 mg/dL (ref 0.40–1.20)
GFR: 75.71 mL/min (ref >60.00)
Glucose, Bld: 94 mg/dL (ref 70–99)
Potassium: 4.3 mEq/L (ref 3.5–5.1)
Sodium: 138 mEq/L (ref 135–145)

## 2022-08-19 LAB — VITAMIN D 25 HYDROXY (VIT D DEFICIENCY, FRACTURES): VITD: 48.57 ng/mL (ref 30.00–100.00)

## 2022-08-19 NOTE — Progress Notes (Signed)
Patient ID: Abigail Cohen, female   DOB: 1941/07/05, 82 y.o.   MRN: [id number]   HPI  Abigail Cohen is a 82 y.o.-year-old very pleasant female, returning for follow-up for osteoporosis.  Last visit 1 year ago.  Interim history: No falls or fractures since last visit.  She walks with a walker.  She still has significant neuropathic pain. She continues to have sciatica.  She had previous steroid injections.  She had physical therapy for this. She had another tooth extracted since last OV. No pbs with healing. She ha R shoulder pain >> has acupuncture.  She was diagnosed with osteoporosis in 2015.  Reviewed previous DXA scan reports: Date L1-L4 T score FN T score 33% distal Radius UD radius  [facility] N/a RFN: -1.3 LFN: -1.5 -2.9 (+4.0%) -2.0   06/23/2020 [facility] n/a RFN: -1.4 LFN: -0.9 -3.3 -2.2  06/22/2018 [facility] n/a RFN: -1.4 LFN: -1.4 -3.1 -2.2  05/10/2016 ([facility]) n/a RFN: -2.0 LFN: -1.5 -3.1 -2.8  05/09/2014 ([facility]) n/a RFN: n/a LFN: -1.7 -2.6 -1.3  04/05/2012 ([facility]) n/a RFN: -1.7 -2.2   11/02/2006 ([facility]) n/a RFN: -1.6 -2.1   10/04/2001 ([facility])  -0.5 LFN: -0.8 n/a    No fractures since last visit.  She denies dizziness, vertigo, orthostasis, poor vision.  She had an endplate L1 vertebral fx (vb bone height loss of up to 80%) - MRI 05/28/2019.  Reviewed osteoporotic treatment: - Fosamax 2015-06/2016 - Prolia: 08/2016 02/2017 09/2017 03/2018 09/13/2018 03/20/2019 10/02/2019 04/10/2020 10/10/2020 04/14/2021  11/11/2021 05/18/2022  She denies: Jaw, hip, thigh pain.  No history of vitamin D deficiency: Lab Results  Component Value Date   VD25OH 39.41 08/13/2021   VD25OH 54.4 08/07/2020   VD25OH 40.21 08/08/2019   VD25OH 47.97 08/02/2018   VD25OH 48.18 08/02/2017   VD25OH 41.40 08/04/2016  11/24/2015: vit D 49.2 06/03/2014: Vit D 39.7  She continues on: - vitamin D 400 units daily - calcium citrate  250-500 mg daily - multivitamin daily  She was previously doing weightbearing exercises - [facility] on [address] >> stopped before last visit.  She was then exercising at home on her glider, but at last visit she had back pain and could not exercise >> now restarted exercise as back pain is resolved after the AAA surgery. Now 6000 steps per day at the advice of the rheumatology.  Menopause was in her 2s.  No family history of osteoporosis.  No history of kidney stones.  She had hypocalcemia in 2021 in the setting of aortic aneurysm repair surgery: Lab Results  Component Value Date   PTH 29 08/04/2016   CALCIUM 9.8 11/05/2021   CALCIUM 9.9 08/13/2021   CALCIUM 10.0 09/22/2020   CALCIUM 9.2 08/07/2020   CALCIUM 7.1 (L) 08/31/2019   CALCIUM 7.1 (L) 08/29/2019   CALCIUM 7.9 (L) 08/28/2019   CALCIUM 9.1 08/27/2019   CALCIUM 9.6 08/02/2018   CALCIUM 9.3 08/02/2017   No history of thyrotoxicosis: Lab Results  Component Value Date   TSH 1.19 09/22/2020   TSH 0.54 08/04/2016   No history of CKD. Last BUN/Cr: Lab Results  Component Value Date   BUN 27 (H) 11/05/2021   CREATININE 0.73 11/05/2021   She had one steroid injection for hip bursitis in the past, but not recently.  She has peripheral neuropathy which unclear etiology.  At our first visit, I advised her to start alpha-lipoic acid and she takes 200 mg daily,  and also benfothiamine.  ROS: + see HPI Neurological: no tremors/+ numbness/+ tingling/no dizziness  I reviewed pt's medications, allergies, PMH, social hx, family hx, and changes were documented in the history of present illness. Otherwise, unchanged from my initial visit note.  Past Medical History:  Diagnosis Date   AAA (abdominal aortic aneurysm) ([place])    Allergy    Essential hypertension h   Family history of anesthesia complication    daughter gets nauseated   Hearing loss    wears hearing aids   Hypertension    Knee joint  replacement status    Osteoarthritis    Osteopenia    Osteoporosis    Presence of intracervical pessary    Uterine prolapse    Wears glasses    Wears partial dentures    upper and lower partial   Past Surgical History:  Procedure Laterality Date   ABDOMINAL AORTIC ANEURYSM REPAIR N/A 08/28/2019   Procedure: ANEURYSM ABDOMINAL AORTIC REPAIR OPEN;  Surgeon: Angelia Mould, MD;  Location: [facility] OR;  Service: Vascular;  Laterality: N/A;   COLONOSCOPY     EYE SURGERY     cataracts removal   HERNIA REPAIR  07/07/12   Dr Aretha Parrot HERNIA REPAIR  07/07/2012   Procedure: HERNIA REPAIR INGUINAL ADULT;  Surgeon: Haywood Lasso, MD;  Location: [place];  Service: General;  Laterality: Left;  left inguinal area   REPLACEMENT TOTAL KNEE BILATERAL  2003   TONSILLECTOMY  1945   WISDOM TOOTH EXTRACTION     Social History   Social History   Marital status: Divorced    Spouse name: N/A   Number of children: N/A   Years of education: N/A   Occupational History   Not on file.   Social History Main Topics   Smoking status: Former Smoker    Start date: 07/05/2004    Quit date: 06/30/2005   Smokeless tobacco: Never Used   Alcohol use Yes     Comment: social   Drug use: No   Current Outpatient Medications  Medication Sig Dispense Refill   acetaminophen (TYLENOL) 500 MG tablet Take 500 mg by mouth every 6 (six) hours as needed (ARTHRITIS PAIN.).     Alpha Lipoic Acid 200 MG CAPS See admin instructions.     aspirin EC 81 MG tablet Take 1 tablet (81 mg total) by mouth daily.     BENFOTIAMINE PO Take 300 mg by mouth daily.     CALCIUM CITRATE PO Take 1-2 tablets by mouth at bedtime.     Cholecalciferol (VITAMIN D3) 10 MCG (400 UNIT) tablet Take 400 Units by mouth daily.     denosumab (PROLIA) 60 MG/ML SOSY injection Inject 60 mg into the skin every 6 (six) months.     estradiol (ESTRACE) 0.1 MG/GM vaginal cream Place 1 Applicatorful vaginally 2 (two) times a week.       gabapentin (NEURONTIN) 300 MG capsule Take 300 mg by mouth at bedtime.     ibuprofen (ADVIL) 200 MG tablet 2 tabs     Magnesium 250 MG TABS Take 250 mg by mouth daily.      metoprolol succinate (TOPROL-XL) 25 MG 24 hr tablet Take 1 tablet (25 mg total) by mouth daily. 90 tablet 3   Multiple Vitamin (MULTIVITAMIN WITH MINERALS) TABS tablet Take 1 tablet by mouth daily.     polyethylene glycol (MIRALAX / GLYCOLAX) 17 g packet 1/2 packet mixed with 8 ounces of fluid     rosuvastatin (CRESTOR) 10  MG tablet TAKE 1 TABLET(10 MG) BY MOUTH DAILY 90 tablet 3   No current facility-administered medications for this visit.   Allergies  Allergen Reactions   Penicillins     Blood shot eyes, felt poorly.  Tolerates amoxicillin with dentist   Family History  Problem Relation Age of Onset   Heart disease Mother    Heart attack Mother        85s   Arrhythmia Mother    Heart disease Father        12s   Heart attack Father    Cancer Sister        endometrial   PE: BP 124/80 (BP Location: Right Arm, Patient Position: Sitting, Cuff Size: Normal)   Pulse 96   Ht 4' 10"$  (1.473 m)   Wt 128 lb 9.6 oz (58.3 kg)   LMP  (LMP Unknown)   SpO2 98%   BMI 26.88 kg/m  Wt Readings from Last 3 Encounters:  08/19/22 128 lb 9.6 oz (58.3 kg)  05/31/22 130 lb 6.4 oz (59.1 kg)  05/10/22 129 lb 6.4 oz (58.7 kg)   Constitutional: normal weight, in NAD, + antalgic gait - with cane Eyes: EOMI, no exophthalmos ENT: no thyromegaly, no cervical lymphadenopathy Cardiovascular: tachycardia, RR, No MRG Respiratory: CTA B Musculoskeletal: no deformities Skin: no rashes Neurological: no tremor with outstretched hands  Assessment: 1. Osteoporosis  2.  Peripheral neuropathy  Plan: 1. Osteoporosis -Likely age-related, postmenopausal -Reviewing the bone density report from 06/2020, the lumbar spine could not be analyzed but there was a significant increase in T-score at the level of the left femoral neck,  instability at the 33% distal radius and the right femoral neck. -Reviewing the latest bone density report, from 06/24/2022, the distal radius and right femoral neck T-scores appear to be improved, but there was decreased at the level of the left femoral neck.  However, looking at the previous T-scores for the LFN, the score obtained in 06/2022 is more consistent with the score obtained in 2019 and prior to this, so it appears that the 2021 T-score was actually an outlier -My recommendation for now would be to continue Prolia.  She agrees. -She tolerates Prolia well, without jaw/thigh/hip pain.  She previously had a dental extraction while on Prolia and this healed well. -No falls or fractures since last visit.  She is aware of fall precautions.  She walks outside with a 3 pronged cane. -She could not start weightbearing exercises due to back pain.  At last visit she was using the glider after her back pain improved after surgery.  At last visit, I advised her to combine this with weightbearing exercises.  At that time, she was in physical therapy and I advised her to discuss with the trainer what are some safe weightbearing exercises for her.  As of now, she still has peripheral neuropathy so she is at high risk for falls.  She is using the glider and walks 6000 steps a day.  I advised her to add upper body weightbearing exercises.  This is difficult for her to do due to right shoulder pain, but she will try this. -Reviewed latest vitamin D level, and this was normal at last visit -We will recheck her vitamin D and BMP today -I will see her back in a year  2.  Peripheral neuropathy -Likely related to spine osteoarthritis with intervertebral compression -She is not taking alpha lipoic acid 200 mg daily.  We discussed that the recommended dose is  actually higher, 600 mg twice a day.  I wrote this down for her and she will start this.  Component     Latest Ref Rng 08/19/2022  VITD     30.00 - 100.00  ng/mL 48.57   Glucose     70 - 99 mg/dL 94   BUN     6 - 23 mg/dL 30 (H)   Creatinine     0.40 - 1.20 mg/dL 0.73   Sodium     135 - 145 mEq/L 138   Potassium     3.5 - 5.1 mEq/L 4.3   Chloride     96 - 112 mEq/L 103   CO2     19 - 32 mEq/L 29   Calcium     8.4 - 10.5 mg/dL 9.5   GFR     >60.00 mL/min 75.71    Philemon Kingdom, MD PhD [facility] Endocr[facility]

## 2022-08-19 NOTE — Patient Instructions (Addendum)
Please continue Prolia.  Stop at the lab.  Please come back for a follow-up appointment in 1 year.

## 2022-09-17 ENCOUNTER — Encounter: Payer: Self-pay | Admitting: Family

## 2022-09-17 ENCOUNTER — Ambulatory Visit (INDEPENDENT_AMBULATORY_CARE_PROVIDER_SITE_OTHER): Payer: Medicare Other | Admitting: Family

## 2022-09-17 VITALS — BP 127/80 | HR 83 | Temp 97.8°F | Ht <= 58 in | Wt 131.2 lb

## 2022-09-17 DIAGNOSIS — J301 Allergic rhinitis due to pollen: Secondary | ICD-10-CM

## 2022-09-17 DIAGNOSIS — H1013 Acute atopic conjunctivitis, bilateral: Secondary | ICD-10-CM

## 2022-09-17 MED ORDER — OLOPATADINE HCL 0.2 % OP SOLN: OPHTHALMIC | 1 refills | Status: DC

## 2022-09-17 MED ORDER — TRIAMCINOLONE ACETONIDE 55 MCG/ACT NA AERO: 1.0000 | INHALATION_SPRAY | Freq: Every day | NASAL | 2 refills | Status: DC

## 2022-09-17 NOTE — Progress Notes (Signed)
Patient ID: Abigail Cohen, female    DOB: 1940/08/13, 82 y.o.   MRN: [id number]  Chief Complaint  Patient presents with   Sinus Problem    sx for 6d    HPI:      Sinus sx:   Pt c/o hoarseness, Nasal congestion and fever of 99.0 at home and red eyes since last Saturday. Also reports clear to light yellow nasal mucus, occasional dry cough. Denies any body aches, reports feeling tired. Has tried sudafed and tylenol which did help with symptoms, but concerned about Sudafed with her BP meds. Covid negative Tuesday.     Assessment & Plan:  1. Seasonal allergic rhinitis due to pollen - sending Nasacort, advised on use & SE, advised even if sx are more viral than allergy related, she will still get benefit and ok to stop after 1-2 weeks if sx are improved. Advised on using generic nasal saline spray tid & prior to Nasacort. Stop the Sudafed. Advised not contagious if no fever or cough. Ok to call Monday if sx are worse or no better.  - triamcinolone (NASACORT) 55 MCG/ACT AERO nasal inhaler; Place 1 spray into the nose daily. Start with 1 spray each side twice a day for 3 days, then reduce to daily.  Dispense: 1 each; Refill: 2  2. Allergic conjunctivitis of both eyes - sending Pataday drops, advised on use & SE, use for 1 week, if sx improved then can stop using.  - triamcinolone (NASACORT) 55 MCG/ACT AERO nasal inhaler; Place 1 spray into the nose daily. Start with 1 spray each side twice a day for 3 days, then reduce to daily.  Dispense: 1 each; Refill: 2 - Olopatadine HCl (PATADAY) 0.2 % SOLN; Apply 1 drop to each eye once daily.  Dispense: 2.5 mL; Refill: 1  Subjective:    Outpatient Medications Prior to Visit  Medication Sig Dispense Refill   acetaminophen (TYLENOL) 500 MG tablet Take 500 mg by mouth every 6 (six) hours as needed (ARTHRITIS PAIN.).     Alpha Lipoic Acid 200 MG CAPS See admin instructions.     aspirin EC 81 MG tablet Take 1 tablet (81 mg total) by mouth daily.      BENFOTIAMINE PO Take 300 mg by mouth daily.     CALCIUM CITRATE PO Take 1-2 tablets by mouth at bedtime.     Cholecalciferol (VITAMIN D3) 10 MCG (400 UNIT) tablet Take 400 Units by mouth daily.     denosumab (PROLIA) 60 MG/ML SOSY injection Inject 60 mg into the skin every 6 (six) months.     estradiol (ESTRACE) 0.1 MG/GM vaginal cream Place 1 Applicatorful vaginally 2 (two) times a week.      gabapentin (NEURONTIN) 300 MG capsule Take 300 mg by mouth at bedtime.     ibuprofen (ADVIL) 200 MG tablet 2 tabs     Magnesium 250 MG TABS Take 250 mg by mouth daily.      metoprolol succinate (TOPROL-XL) 25 MG 24 hr tablet Take 1 tablet (25 mg total) by mouth daily. 90 tablet 3   Multiple Vitamin (MULTIVITAMIN WITH MINERALS) TABS tablet Take 1 tablet by mouth daily.     polyethylene glycol (MIRALAX / GLYCOLAX) 17 g packet 1/2 packet mixed with 8 ounces of fluid     rosuvastatin (CRESTOR) 10 MG tablet TAKE 1 TABLET(10 MG) BY MOUTH DAILY 90 tablet 3   No facility-administered medications prior to visit.   Past Medical History:  Diagnosis Date  AAA (abdominal aortic aneurysm) ([place])    Allergy    Essential hypertension h   Family history of anesthesia complication    daughter gets nauseated   Hearing loss    wears hearing aids   Hypertension    Knee joint replacement status    Osteoarthritis    Osteopenia    Osteoporosis    Presence of intracervical pessary    Uterine prolapse    Wears glasses    Wears partial dentures    upper and lower partial   Past Surgical History:  Procedure Laterality Date   ABDOMINAL AORTIC ANEURYSM REPAIR N/A 08/28/2019   Procedure: ANEURYSM ABDOMINAL AORTIC REPAIR OPEN;  Surgeon: Angelia Mould, MD;  Location: [facility] OR;  Service: Vascular;  Laterality: N/A;   COLONOSCOPY     EYE SURGERY     cataracts removal   HERNIA REPAIR  07/07/12   Dr Aretha Parrot HERNIA REPAIR  07/07/2012   Procedure: HERNIA REPAIR INGUINAL ADULT;  Surgeon: Haywood Lasso, MD;   Location: [place];  Service: General;  Laterality: Left;  left inguinal area   REPLACEMENT TOTAL KNEE BILATERAL  2003   TONSILLECTOMY  1945   WISDOM TOOTH EXTRACTION     Allergies  Allergen Reactions   Penicillins     Blood shot eyes, felt poorly.  Tolerates amoxicillin with dentist      Objective:    Physical Exam Vitals and nursing note reviewed.  Constitutional:      Appearance: Normal appearance. She is not ill-appearing.     Interventions: Face mask in place.  HENT:     Right Ear: Tympanic membrane and ear canal normal.     Left Ear: Tympanic membrane and ear canal normal.     Nose: Congestion and rhinorrhea present.     Right Sinus: No frontal sinus tenderness.     Left Sinus: No frontal sinus tenderness.     Mouth/Throat:     Mouth: Mucous membranes are moist.     Pharynx: Posterior oropharyngeal erythema present. No pharyngeal swelling, oropharyngeal exudate or uvula swelling.     Tonsils: No tonsillar exudate or tonsillar abscesses.  Eyes:     General: Allergic shiner present.     Conjunctiva/sclera:     Right eye: Right conjunctiva is injected. Exudate present. No hemorrhage.    Left eye: Left conjunctiva is injected (left > right). Exudate present. No hemorrhage. Cardiovascular:     Rate and Rhythm: Normal rate and regular rhythm.  Pulmonary:     Effort: Pulmonary effort is normal.     Breath sounds: Normal breath sounds.  Musculoskeletal:        General: Normal range of motion.  Lymphadenopathy:     Head:     Right side of head: No preauricular or posterior auricular adenopathy.     Left side of head: No preauricular or posterior auricular adenopathy.     Cervical: No cervical adenopathy.  Skin:    General: Skin is warm and dry.  Neurological:     Mental Status: She is alert.  Psychiatric:        Mood and Affect: Mood normal.        Behavior: Behavior normal.    BP 127/80 (BP Location: Left Arm, Patient Position: Sitting, Cuff  Size: Normal)   Pulse 83   Temp 97.8 F (36.6 C) (Temporal)   Ht 4\' 10"  (1.473 m)   Wt 131 lb 4 oz (59.5 kg)   LMP  (  LMP Unknown)   SpO2 93%   BMI 27.43 kg/m  Wt Readings from Last 3 Encounters:  09/17/22 131 lb 4 oz (59.5 kg)  08/19/22 128 lb 9.6 oz (58.3 kg)  05/31/22 130 lb 6.4 oz (59.1 kg)   *Extra time (7min) spent with patient today which consisted of chart review, discussing diagnoses, work up, treatment, answering many questions, and documentation.     Jeanie Sewer, NP

## 2022-09-17 NOTE — Patient Instructions (Addendum)
It was very nice to see you today!  I believe you are having some allergy symptoms overlapping a mild virus. I have sent over eye drops for your itchy eyes- follow directions on the bottle. I have also sent over a Nasacort, a nasal spray that can help your symptoms. Use 1 squirt each nostril twice a day, then reduce to daily use until you feel better, then you can stop, but if your symptoms return I would restart the Nasacort and use until the end of allergy season, around beginning of May.  Follow the instructions below: Use Nasal saline spray (i.e.,Simply Saline or other generic) or a nasal saline lavage (i.e.,NeilMed, Neti Pot) to flush allergens, general disinfecting &  prior to using medicated nasal sprays. Start Nasacort nasal spray 1 spray each nostril twice a day for 3 days, then reduce to daily use. 2 sprays twice a day is ok for really bad symptoms for 1 week, then reduce to 1 spray twice a day or daily. May add over the counter antihistamines such as Xyzal (levocetirizine), Zyrtec (cetirizine), Claritin (loratadine), or Allegra (fexofenadine) daily as needed. May take twice a day if needed as long as it does not cause drowsiness or too much dryness in nose, mouth or throat.        PLEASE NOTE:  If you had any lab tests please let us know if you have not heard back within a few days. You may see your results on MyChart before we have a chance to review them but we will give you a call once they are reviewed by [place]. If we ordered any referrals today, please let us know if you have not heard from their office within the next week.

## 2022-10-07 NOTE — Telephone Encounter (Signed)
Prolia VOB initiated via [email or web address]  Last Prolia inj 05/18/22 Next Prolia inj due 11/17/22

## 2022-10-11 DIAGNOSIS — M25512 Pain in left shoulder: Secondary | ICD-10-CM | POA: Diagnosis not present

## 2022-10-11 DIAGNOSIS — M19011 Primary osteoarthritis, right shoulder: Secondary | ICD-10-CM | POA: Diagnosis not present

## 2022-10-14 NOTE — Telephone Encounter (Signed)
Pt ready for scheduling on or after 11/17/22  Out-of-pocket cost due at time of visit: $0  Primary: Medicare Prolia co-insurance: 20% (approximately $302) Admin fee co-insurance: 20% (approximately $25)  Deductible: $240 of $240 met  Secondary: BCBS  Medicare Supp Plan F Prolia co-insurance: Covers Medicare Part B co-insurance Admin fee co-insurance: Covers Medicare Part B co-insurance  Deductible:  Covered by secondary  Prior Auth: NOT required PA# Valid:   ** This summary of benefits is an estimation of the patient's out-of-pocket cost. Exact cost may vary based on individual plan coverage.

## 2022-10-21 ENCOUNTER — Encounter: Payer: Self-pay | Admitting: Podiatry

## 2022-10-21 ENCOUNTER — Ambulatory Visit (INDEPENDENT_AMBULATORY_CARE_PROVIDER_SITE_OTHER): Payer: Medicare Other | Admitting: Podiatry

## 2022-10-21 DIAGNOSIS — B351 Tinea unguium: Secondary | ICD-10-CM

## 2022-10-21 DIAGNOSIS — M48061 Spinal stenosis, lumbar region without neurogenic claudication: Secondary | ICD-10-CM | POA: Insufficient documentation

## 2022-10-21 DIAGNOSIS — M79676 Pain in unspecified toe(s): Secondary | ICD-10-CM

## 2022-10-21 DIAGNOSIS — M1991 Primary osteoarthritis, unspecified site: Secondary | ICD-10-CM | POA: Insufficient documentation

## 2022-10-21 DIAGNOSIS — M25519 Pain in unspecified shoulder: Secondary | ICD-10-CM | POA: Insufficient documentation

## 2022-10-21 NOTE — Progress Notes (Signed)
She presents today chief complaint of painful elongated toenails and calluses bilaterally.  Pulses are palpable.  Toenails are long thick yellow dystrophic onychomycotic benign skin lesions plantar aspect of the forefoot medial aspect of the great toe at metatarsophalangeal joint.  Assessment: Pain in limb secondary to onychomycosis benign skin lesions.  Plan: Debridement of benign lesions debridement of toenails 1 through 5 bilaterally follow-up with her on an as-needed basis or in 3 months.

## 2022-10-26 DIAGNOSIS — N952 Postmenopausal atrophic vaginitis: Secondary | ICD-10-CM | POA: Diagnosis not present

## 2022-10-26 DIAGNOSIS — N949 Unspecified condition associated with female genital organs and menstrual cycle: Secondary | ICD-10-CM | POA: Diagnosis not present

## 2022-10-26 DIAGNOSIS — Z4689 Encounter for fitting and adjustment of other specified devices: Secondary | ICD-10-CM | POA: Diagnosis not present

## 2022-10-27 ENCOUNTER — Encounter: Payer: Self-pay | Admitting: Family Medicine

## 2022-10-27 ENCOUNTER — Ambulatory Visit (INDEPENDENT_AMBULATORY_CARE_PROVIDER_SITE_OTHER): Payer: Medicare Other | Admitting: Family Medicine

## 2022-10-27 VITALS — BP 124/80 | HR 77 | Temp 97.0°F | Ht <= 58 in | Wt 128.6 lb

## 2022-10-27 DIAGNOSIS — E785 Hyperlipidemia, unspecified: Secondary | ICD-10-CM

## 2022-10-27 DIAGNOSIS — I714 Abdominal aortic aneurysm, without rupture, unspecified: Secondary | ICD-10-CM

## 2022-10-27 DIAGNOSIS — J439 Emphysema, unspecified: Secondary | ICD-10-CM

## 2022-10-27 DIAGNOSIS — I10 Essential (primary) hypertension: Secondary | ICD-10-CM | POA: Diagnosis not present

## 2022-10-27 LAB — COMPREHENSIVE METABOLIC PANEL
ALT: 21 U/L (ref 0–35)
AST: 33 U/L (ref 0–37)
Albumin: 4 g/dL (ref 3.5–5.2)
Alkaline Phosphatase: 42 U/L (ref 39–117)
BUN: 23 mg/dL (ref 6–23)
CO2: 30 mEq/L (ref 19–32)
Calcium: 9.3 mg/dL (ref 8.4–10.5)
Chloride: 102 mEq/L (ref 96–112)
Creatinine, Ser: 0.64 mg/dL (ref 0.40–1.20)
GFR: 81.25 mL/min (ref >60.00)
Glucose, Bld: 96 mg/dL (ref 70–99)
Potassium: 4.6 mEq/L (ref 3.5–5.1)
Sodium: 140 mEq/L (ref 135–145)
Total Bilirubin: 0.5 mg/dL (ref 0.2–1.2)
Total Protein: 6.3 g/dL (ref 6.0–8.3)

## 2022-10-27 LAB — URINALYSIS, ROUTINE W REFLEX MICROSCOPIC
Bilirubin Urine: NEGATIVE
Ketones, ur: NEGATIVE
Nitrite: NEGATIVE
Specific Gravity, Urine: 1.01 (ref 1.000–1.030)
Total Protein, Urine: NEGATIVE
Urine Glucose: NEGATIVE
Urobilinogen, UA: 0.2 (ref 0.0–1.0)
pH: 7 (ref 5.0–8.0)

## 2022-10-27 LAB — CBC WITH DIFFERENTIAL/PLATELET
Basophils Absolute: 0 10*3/uL (ref 0.0–0.1)
Basophils Relative: 0.7 % (ref 0.0–3.0)
Eosinophils Absolute: 0.1 10*3/uL (ref 0.0–0.7)
Eosinophils Relative: 1.7 % (ref 0.0–5.0)
HCT: 39.5 % (ref 36.0–46.0)
Hemoglobin: 13.2 g/dL (ref 12.0–15.0)
Lymphocytes Relative: 20.2 % (ref 12.0–46.0)
Lymphs Abs: 1.2 10*3/uL (ref 0.7–4.0)
MCHC: 33.3 g/dL (ref 30.0–36.0)
MCV: 95 fl (ref 78.0–100.0)
Monocytes Absolute: 0.5 10*3/uL (ref 0.1–1.0)
Monocytes Relative: 8.5 % (ref 3.0–12.0)
Neutro Abs: 4.2 10*3/uL (ref 1.4–7.7)
Neutrophils Relative %: 68.9 % (ref 43.0–77.0)
Platelets: 163 10*3/uL (ref 150.0–400.0)
RBC: 4.16 Mil/uL (ref 3.87–5.11)
RDW: 14.1 % (ref 11.5–15.5)
WBC: 6 10*3/uL (ref 4.0–10.5)

## 2022-10-27 LAB — LIPID PANEL
Cholesterol: 134 mg/dL (ref 0–200)
HDL: 62.5 mg/dL (ref >39.00)
LDL Cholesterol: 60 mg/dL (ref 0–99)
NonHDL: 71.57
Total CHOL/HDL Ratio: 2
Triglycerides: 56 mg/dL (ref 0.0–149.0)
VLDL: 11.2 mg/dL (ref 0.0–40.0)

## 2022-10-27 NOTE — Progress Notes (Signed)
Phone [phone] In person visit   Subjective:   Abigail Cohen is a 82 y.o. year old very pleasant female patient who presents for/with See problem oriented charting Chief Complaint  Patient presents with   Medical Management of Chronic Issues   Hyperlipidemia    Past Medical History-  Patient Active Problem List   Diagnosis Date Noted   nonobstructive CAD- follows with Dr. Flora Lipps cardiology  05/04/2021    Priority: High   Abdominal aortic aneurysm (AAA) 08/28/2019    Priority: High   Hyperlipidemia 10/30/2020    Priority: Medium    Essential hypertension 10/30/2020    Priority: Medium    Osteoarthritis 10/30/2020    Priority: Medium    Osteoporosis 08/02/2016    Priority: Medium    Aortic atherosclerosis 11/05/2021    Priority: Low   Emphysema lung 11/05/2021    Priority: Low   Diverticulosis 11/05/2021    Priority: Low   Allergic rhinitis 10/30/2020    Priority: Low   Left inguinal hernia 06/20/2012    Priority: Low   Primary localized osteoarthrosis of multiple sites 10/21/2022   Shoulder joint pain 10/21/2022   Spinal stenosis of lumbar region 10/21/2022    Medications- reviewed and updated Current Outpatient Medications  Medication Sig Dispense Refill   acetaminophen (TYLENOL) 500 MG tablet Take 500 mg by mouth every 6 (six) hours as needed (ARTHRITIS PAIN.).     Alpha Lipoic Acid 200 MG CAPS See admin instructions.     aspirin EC 81 MG tablet Take 1 tablet (81 mg total) by mouth daily.     BENFOTIAMINE PO Take 300 mg by mouth daily.     CALCIUM CITRATE PO Take 1-2 tablets by mouth at bedtime.     Cholecalciferol (VITAMIN D3) 10 MCG (400 UNIT) tablet Take 400 Units by mouth daily.     denosumab (PROLIA) 60 MG/ML SOSY injection Inject 60 mg into the skin every 6 (six) months.     estradiol (ESTRACE) 0.1 MG/GM vaginal cream Place 1 Applicatorful vaginally 2 (two) times a week.      gabapentin (NEURONTIN) 300 MG capsule Take 300 mg by mouth at bedtime.      ibuprofen (ADVIL) 200 MG tablet 2 tabs     Magnesium 250 MG TABS Take 250 mg by mouth daily.      metoprolol succinate (TOPROL-XL) 25 MG 24 hr tablet Take 1 tablet (25 mg total) by mouth daily. 90 tablet 3   Multiple Vitamin (MULTIVITAMIN WITH MINERALS) TABS tablet Take 1 tablet by mouth daily.     polyethylene glycol (MIRALAX / GLYCOLAX) 17 g packet 1/2 packet mixed with 8 ounces of fluid     rosuvastatin (CRESTOR) 10 MG tablet TAKE 1 TABLET(10 MG) BY MOUTH DAILY 90 tablet 3   No current facility-administered medications for this visit.     Objective:  BP 124/80   Pulse 77   Temp (!) 97 F (36.1 C)   Ht  (1.473 m)   Wt 128 lb 9.6 oz (58.3 kg)   LMP  (LMP Unknown)   SpO2 95%   BMI 26.88 kg/m  Gen: NAD, resting comfortably CV: RRR no murmurs rubs or gallops Lungs: CTAB no crackles, wheeze, rhonchi Ext: trace edema Skin: warm, dry    Assessment and Plan   #Abdominal aortic aneurysm without rupture with history of open repair-follows with Dr. Flora Lipps of cardiology as well as vascular surgery S:Medication: Aspirin and statin recommended per cardiology. She is on both. She had  this updated on 05/24/22 and stable.  A/P: stable- continue current medicines and cardiology follow up    #Coronary artery disease based on coronary artery calcification seen on CT chest with reassuring stress test and echocardiogram-follows with Dr. Bufford Buttner of cardiology #hyperlipidemia- LDL goal under 70 S: Medication:Rosuvastatin 10 mg, aspirin  -no chest pain or shortness of breath  Lab Results  Component Value Date   CHOL 147 11/05/2021   HDL 74.60 11/05/2021   LDLCALC 63 11/05/2021   TRIG 46.0 11/05/2021   CHOLHDL 2 11/05/2021  A/P: CAD nonobstructive and asymptomatic - continue current medications Lipids at goal- update with labs and continue current medications   #hypertension S: medication: Metoprolol succinate 25 mg twice daily BP Readings from Last 3 Encounters:  10/27/22 124/80   09/17/22 127/80  08/19/22 124/80  A/P: stable- continue current medicines    # Osteoporosis- sees Dr. Lafe Garin- staying on Prolia   # former smoker- quit in 2006- 35 years at least. Get UA with yearly labs  including today  #Pessary through ob/gyn and also on estrace cream   #emphysema noted on CT abd/pelvis on lower portion of lungs in 2020- denies shortness of breath- continue to monitor   #Forehead polyp/supraorbital 1 cm osteoma- on exam 1.5 cm about- backup tnot bothering her- monitor   #Spinal stenosis- ultimately diagnosed with spinal stenosis- not best surgical candidate- 2023 accupuncture somewhat helpful Mu accupuncture and chinese herbs in 2023-referred by [facility] Dr. Modesto Charon- still helpful- but insurance doesn't coverage  #Adnexal cyst- follow up with [facility] gynecology- as long as stable they are planning to avoid surgery - first noted in 2019  #Right shoulder osteoarthritis - considering surgery with Dr. Rennis Chris or Dr. Ave Filter. Acupuncture helps some- last 2 weeks ago and bothering him more. Pain 5-7/10. Harder to reach cabinets -would need cardiac clearance but medically do not see any reason for this to be postponed otherwise- sounds like she would have significant potential benefit (but aware not zero risk)  Recommended follow up: Return in about 28 weeks (around 05/11/2023) for followup or sooner if needed.Schedule b4 you leave. Future Appointments  Date Time Provider Department Center  11/12/2022  9:40 AM O'Neal, Ronnald Ramp, MD [facility] None  11/17/2022  1:30 PM [facility] NURSE [facility] None  01/25/2023  1:45 PM Hamburg, Max T, DPM [facility]  06/03/2023  1:00 PM [facility] ANNUAL WELLNESS VISIT 1 [facility]  08/22/2023  1:00 PM Carlus Pavlov, MD [facility] None    Lab/Order associations:   ICD-10-CM   1. Essential hypertension  I10 Comprehensive metabolic panel    CBC with Differential/Platelet    Lipid panel    Urinalysis, Routine  w reflex microscopic    2. Hyperlipidemia, unspecified hyperlipidemia type  E78.5 Comprehensive metabolic panel    CBC with Differential/Platelet    Lipid panel    3. Pulmonary emphysema, unspecified emphysema type Chronic J43.9     4. Abdominal aortic aneurysm (AAA) without rupture, unspecified part Chronic I71.40      Return precautions advised.  Tana Conch, MD

## 2022-10-27 NOTE — Patient Instructions (Addendum)
Please stop by lab before you go If you have mychart- we will send your results within 3 business days of [place] receiving them.  If you do not have mychart- we will call you about results within 5 business days of [place] receiving them.  *please also note that you will see labs on mychart as soon as they post. I will later go in and write notes on them- will say "notes from Dr. Durene Cal"   Recommended follow up: Return in about 28 weeks (around 05/11/2023) for followup or sooner if needed.Schedule b4 you leave.

## 2022-10-28 ENCOUNTER — Other Ambulatory Visit: Payer: Medicare Other

## 2022-10-28 ENCOUNTER — Ambulatory Visit: Payer: Medicare Other | Admitting: Cardiovascular Disease

## 2022-10-28 ENCOUNTER — Other Ambulatory Visit: Payer: Self-pay

## 2022-10-28 DIAGNOSIS — R319 Hematuria, unspecified: Secondary | ICD-10-CM

## 2022-10-29 ENCOUNTER — Other Ambulatory Visit: Payer: Medicare Other

## 2022-10-29 DIAGNOSIS — M48061 Spinal stenosis, lumbar region without neurogenic claudication: Secondary | ICD-10-CM | POA: Diagnosis not present

## 2022-10-29 DIAGNOSIS — Z6826 Body mass index (BMI) 26.0-26.9, adult: Secondary | ICD-10-CM | POA: Diagnosis not present

## 2022-10-29 DIAGNOSIS — M1991 Primary osteoarthritis, unspecified site: Secondary | ICD-10-CM | POA: Diagnosis not present

## 2022-10-29 DIAGNOSIS — E663 Overweight: Secondary | ICD-10-CM | POA: Diagnosis not present

## 2022-10-29 DIAGNOSIS — R768 Other specified abnormal immunological findings in serum: Secondary | ICD-10-CM | POA: Diagnosis not present

## 2022-10-29 LAB — URINE CULTURE
MICRO NUMBER:: 14873840
SPECIMEN QUALITY:: ADEQUATE

## 2022-11-01 ENCOUNTER — Ambulatory Visit: Payer: Medicare Other | Admitting: Family Medicine

## 2022-11-11 ENCOUNTER — Other Ambulatory Visit: Payer: Self-pay | Admitting: Cardiovascular Disease

## 2022-11-11 NOTE — Progress Notes (Signed)
Cardiology Office Note:   Date:  11/12/2022  NAME:  Abigail Cohen    MRN: [id number] DOB:  [birth year]   PCP:  Shelva Majestic, MD  Cardiologist:  Reatha Harps, MD  Electrophysiologist:  None   Referring MD: Shelva Majestic, MD   Chief Complaint  Patient presents with   Follow-up        History of Present Illness:   Abigail Cohen is a 82 y.o. female with a hx of AAA, coronary calcifications, HTN, HLD who presents for follow-up.  She is doing well.  Reports no chest pains or trouble breathing.  Using exercise glider up to 30 minutes/day.  Often does more.  Blood pressure stable.  Cholesterol at goal.  Most recent surveillance ultrasound of her aortic repair is stable.  She is without any major cardiac complaints today.  On aspirin.  Lipids are at goal.  Overall doing quite well.  We did a stress test in 2021 that was unremarkable.  We did discuss that she can increase her activity as she sees fit.  Problem List 1. AAA (5.7 cm infrarenal) -s/p open repair 08/28/2019 2. 3-vessel CAD  -calcifications seen on CT -negative nuc med MPI 07/19/2019 -EF 55-60% 3. Former tobacco abuse  4. HTN 5. HLD -T chol 134, HDL 62, LDL 60, TG 56  Past Medical History: Past Medical History:  Diagnosis Date   AAA (abdominal aortic aneurysm) (HCC)    Allergy    Essential hypertension h   Family history of anesthesia complication    daughter gets nauseated   Hearing loss    wears hearing aids   Hypertension    Knee joint replacement status    Osteoarthritis    Osteopenia    Osteoporosis    Presence of intracervical pessary    Uterine prolapse    Wears glasses    Wears partial dentures    upper and lower partial    Past Surgical History: Past Surgical History:  Procedure Laterality Date   ABDOMINAL AORTIC ANEURYSM REPAIR N/A 08/28/2019   Procedure: ANEURYSM ABDOMINAL AORTIC REPAIR OPEN;  Surgeon: Chuck Hint, MD;  Location: [facility] OR;  Service: Vascular;  Laterality: N/A;    COLONOSCOPY     EYE SURGERY     cataracts removal   HERNIA REPAIR  07/07/12   Dr Hector Shade HERNIA REPAIR  07/07/2012   Procedure: HERNIA REPAIR INGUINAL ADULT;  Surgeon: Currie Paris, MD;  Location: [facility];  Service: General;  Laterality: Left;  left inguinal area   REPLACEMENT TOTAL KNEE BILATERAL  2003   TONSILLECTOMY  1945   WISDOM TOOTH EXTRACTION      Current Medications: Current Meds  Medication Sig   acetaminophen (TYLENOL) 500 MG tablet Take 500 mg by mouth every 6 (six) hours as needed (ARTHRITIS PAIN.).   Alpha Lipoic Acid 200 MG CAPS See admin instructions.   aspirin EC 81 MG tablet Take 1 tablet (81 mg total) by mouth daily.   BENFOTIAMINE PO Take 300 mg by mouth daily.   CALCIUM CITRATE PO Take 1-2 tablets by mouth at bedtime.   Cholecalciferol (VITAMIN D3) 10 MCG (400 UNIT) tablet Take 400 Units by mouth daily.   denosumab (PROLIA) 60 MG/ML SOSY injection Inject 60 mg into the skin every 6 (six) months.   estradiol (ESTRACE) 0.1 MG/GM vaginal cream Place 1 Applicatorful vaginally 2 (two) times a week.    gabapentin (NEURONTIN) 300 MG capsule Take 300 mg  by mouth at bedtime.   ibuprofen (ADVIL) 200 MG tablet 2 tabs   Magnesium 250 MG TABS Take 250 mg by mouth daily.    metoprolol succinate (TOPROL-XL) 25 MG 24 hr tablet TAKE 1 TABLET(25 MG) BY MOUTH DAILY   Multiple Vitamin (MULTIVITAMIN WITH MINERALS) TABS tablet Take 1 tablet by mouth daily.   polyethylene glycol (MIRALAX / GLYCOLAX) 17 g packet 1/2 packet mixed with 8 ounces of fluid   rosuvastatin (CRESTOR) 10 MG tablet TAKE 1 TABLET(10 MG) BY MOUTH DAILY     Allergies:    Penicillins   Social History: Social History   Socioeconomic History   Marital status: Divorced    Spouse name: Not on file   Number of children: 2   Years of education: Not on file   Highest education level: Not on file  Occupational History   Occupation: retired Firefighter  Tobacco Use   Smoking status:  Former    Years: 35    Types: Cigarettes    Start date: 07/05/2004    Quit date: 06/30/2005    Years since quitting: 17.3   Smokeless tobacco: Never  Vaping Use   Vaping Use: Never used  Substance and Sexual Activity   Alcohol use: Yes    Alcohol/week: 7.0 standard drinks of alcohol    Types: 7 Glasses of wine per week   Drug use: No   Sexual activity: Not Currently    Birth control/protection: Post-menopausal  Other Topics Concern   Not on file  Social History Narrative   Divorced- ex husband now deceased. Lives alone in townhome.    Daughter lives in town, son lives in town.       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      Hobbies: enjoys travel, Producer, television/film/video, music, dance, Surveyor, quantity    Social Determinants of Health   Financial Resource Strain: Patient Declined (10/24/2022)   Overall Financial Resource Strain (CARDIA)    Difficulty of Paying Living Expenses: Patient declined  Food Insecurity: Patient Declined (10/24/2022)   Hunger Vital Sign    Worried About Running Out of Food in the Last Year: Patient declined    Ran Out of Food in the Last Year: Patient declined  Transportation Needs: Patient Declined (10/24/2022)   PRAPARE - Administrator, Civil Service (Medical): Patient declined    Lack of Transportation (Non-Medical): Patient declined  Physical Activity: Unknown (10/24/2022)   Exercise Vital Sign    Days of Exercise per Week: Patient declined    Minutes of Exercise per Session: 30 min  Stress: Patient Declined (10/24/2022)   [employer] of Occupational Health - Occupational Stress Questionnaire    Feeling of Stress : Patient declined  Social Connections: Unknown (10/24/2022)   Social Connection and Isolation Panel [NHANES]    Frequency of Communication with Friends and Family: Patient declined    Frequency of Social Gatherings with Friends and Family: Patient declined    Attends Religious Services: Patient declined     Database administrator or Organizations: Patient declined    Attends Banker Meetings: Never    Marital Status: Patient declined     Family History: The patient's family history includes Arrhythmia in her mother; Cancer in her sister; Heart attack in her father and mother; Heart disease in her father and mother.  ROS:   All other ROS reviewed and negative. Pertinent positives noted in the HPI.     EKGs/Labs/Other Studies Reviewed:  The following studies were personally reviewed by me today:  Recent Labs: 10/27/2022: ALT 21; BUN 23; Creatinine, Ser 0.64; Hemoglobin 13.2; Platelets 163.0; Potassium 4.6; Sodium 140   Recent Lipid Panel    Component Value Date/Time   CHOL 134 10/27/2022 1127   TRIG 56.0 10/27/2022 1127   HDL 62.50 10/27/2022 1127   CHOLHDL 2 10/27/2022 1127   VLDL 11.2 10/27/2022 1127   LDLCALC 60 10/27/2022 1127    Physical Exam:   VS:  BP 124/72   Pulse 84   Ht 4\' 11"  (1.499 m)   Wt 127 lb 12.8 oz (58 kg)   LMP  (LMP Unknown)   SpO2 97%   BMI 25.81 kg/m    Wt Readings from Last 3 Encounters:  11/12/22 127 lb 12.8 oz (58 kg)  10/27/22 128 lb 9.6 oz (58.3 kg)  09/17/22 131 lb 4 oz (59.5 kg)    General: Well nourished, well developed, in no acute distress Head: Atraumatic, normal size  Eyes: PEERLA, EOMI  Neck: Supple, no JVD Endocrine: No thryomegaly Cardiac: Normal S1, S2; RRR; no murmurs, rubs, or gallops Lungs: Clear to auscultation bilaterally, no wheezing, rhonchi or rales  Abd: Soft, nontender, no hepatomegaly  Ext: No edema, pulses 2+ Musculoskeletal: No deformities, BUE and BLE strength normal and equal Skin: Warm and dry, no rashes   Neuro: Alert and oriented to person, place, time, and situation, CNII-XII grossly intact, no focal deficits  Psych: Normal mood and affect   ASSESSMENT:   Abigail Cohen is a 82 y.o. female who presents for the following: 1. Coronary artery calcification seen on computed tomography   2. Mixed  hyperlipidemia   3. S/P AAA repair     PLAN:   1. Coronary artery calcification seen on computed tomography 2. Mixed hyperlipidemia -Coronary calcifications seen on chest CT.  No symptoms of angina.  Stress test was normal 3 years ago.  She will continue aspirin 81 mg daily.  She is on Crestor 10 mg daily.  LDL is at goal.  3. S/P AAA repair -Status post open AAA repair.  Most recent surveillance ultrasounds unremarkable.  She will continue with yearly ultrasounds to follow this.      Disposition: Return in about 1 year (around 11/12/2023).  Medication Adjustments/Labs and Tests Ordered: Current medicines are reviewed at length with the patient today.  Concerns regarding medicines are outlined above.  Orders Placed This Encounter  Procedures   VAS [place] AAA DUPLEX   No orders of the defined types were placed in this encounter.   Patient Instructions  Medication Instructions:  The current medical regimen is effective;  continue present plan and medications.  *If you need a refill on your cardiac medications before your next appointment, please call your pharmacy*   Testing/Procedures: Your physician has requested that you have an abdominal aorta duplex (12 mon). During this test, an ultrasound is used to evaluate the aorta. Allow 30 minutes for this exam. Do not eat after midnight the day before and avoid carbonated beverages    Follow-Up: At [facility], you and your health needs are our priority.  As part of our continuing mission to provide you with exceptional heart care, we have created designated Provider Care Teams.  These Care Teams include your primary Cardiologist (physician) and Advanced Practice Providers (APPs -  Physician Assistants and Nurse Practitioners) who all work together to provide you with the care you need, when you need it.  We recommend signing up  for the patient portal called "MyChart".  Sign up information is provided on this After Visit  Summary.  MyChart is used to connect with patients for Virtual Visits (Telemedicine).  Patients are able to view lab/test results, encounter notes, upcoming appointments, etc.  Non-urgent messages can be sent to your provider as well.   To learn more about what you can do with MyChart, go to [email or web address].    Your next appointment:   12 month(s)  Provider:   Reatha Harps, MD       Time Spent with Patient: I have spent a total of 25 minutes with patient reviewing hospital notes, telemetry, EKGs, labs and examining the patient as well as establishing an assessment and plan that was discussed with the patient.  > 50% of time was spent in direct patient care.  Signed, Lenna Gilford. Flora Lipps, MD, [facility]  [facility]  [address] [place], [place] [address] [phone]  11/12/2022 10:05 AM

## 2022-11-12 ENCOUNTER — Ambulatory Visit: Payer: Medicare Other | Attending: Cardiovascular Disease | Admitting: Cardiovascular Disease

## 2022-11-12 ENCOUNTER — Encounter: Payer: Self-pay | Admitting: Cardiovascular Disease

## 2022-11-12 VITALS — BP 124/72 | HR 84 | Ht 59.0 in | Wt 127.8 lb

## 2022-11-12 DIAGNOSIS — Z9889 Other specified postprocedural states: Secondary | ICD-10-CM

## 2022-11-12 DIAGNOSIS — E782 Mixed hyperlipidemia: Secondary | ICD-10-CM | POA: Diagnosis not present

## 2022-11-12 DIAGNOSIS — Z8679 Personal history of other diseases of the circulatory system: Secondary | ICD-10-CM | POA: Diagnosis not present

## 2022-11-12 DIAGNOSIS — I2510 Atherosclerotic heart disease of native coronary artery without angina pectoris: Secondary | ICD-10-CM

## 2022-11-12 NOTE — Patient Instructions (Addendum)
Medication Instructions:  The current medical regimen is effective;  continue present plan and medications.  *If you need a refill on your cardiac medications before your next appointment, please call your pharmacy*   Testing/Procedures: Your physician has requested that you have an abdominal aorta duplex (in April before 12 month follow up) During this test, an ultrasound is used to evaluate the aorta. Allow 30 minutes for this exam. Do not eat after midnight the day before and avoid carbonated beverages    Follow-Up: At [facility], you and your health needs are our priority.  As part of our continuing mission to provide you with exceptional heart care, we have created designated Provider Care Teams.  These Care Teams include your primary Cardiologist (physician) and Advanced Practice Providers (APPs -  Physician Assistants and Nurse Practitioners) who all work together to provide you with the care you need, when you need it.  We recommend signing up for the patient portal called "MyChart".  Sign up information is provided on this After Visit Summary.  MyChart is used to connect with patients for Virtual Visits (Telemedicine).  Patients are able to view lab/test results, encounter notes, upcoming appointments, etc.  Non-urgent messages can be sent to your provider as well.   To learn more about what you can do with MyChart, go to [email or web address].    Your next appointment:   12 month(s)  Provider:   Reatha Harps, MD

## 2022-11-17 ENCOUNTER — Ambulatory Visit (INDEPENDENT_AMBULATORY_CARE_PROVIDER_SITE_OTHER): Payer: Medicare Other

## 2022-11-17 VITALS — BP 124/76 | HR 73 | Ht 59.0 in | Wt 128.2 lb

## 2022-11-17 DIAGNOSIS — M810 Age-related osteoporosis without current pathological fracture: Secondary | ICD-10-CM | POA: Diagnosis not present

## 2022-11-17 MED ORDER — DENOSUMAB 60 MG/ML ~~LOC~~ SOSY
60.0000 mg | PREFILLED_SYRINGE | Freq: Once | SUBCUTANEOUS | Status: AC
  Administered 2022-11-17: 60 mg via SUBCUTANEOUS

## 2022-11-17 NOTE — Progress Notes (Signed)
After obtaining consent, and per orders of Dr. Elvera Lennox, injection of Prolia 60mg /mL given by Pollie Meyer in the Left Arm SQ. Patient instructed to remain in clinic for 20 minutes afterwards, and to report any adverse reaction to me immediately.

## 2022-12-09 NOTE — Telephone Encounter (Addendum)
Last Prolia inj 11/17/22 Next Prolia inj due on 05/21/23

## 2023-01-13 DIAGNOSIS — D1801 Hemangioma of skin and subcutaneous tissue: Secondary | ICD-10-CM | POA: Diagnosis not present

## 2023-01-13 DIAGNOSIS — D2272 Melanocytic nevi of left lower limb, including hip: Secondary | ICD-10-CM | POA: Diagnosis not present

## 2023-01-13 DIAGNOSIS — L819 Disorder of pigmentation, unspecified: Secondary | ICD-10-CM | POA: Diagnosis not present

## 2023-01-13 DIAGNOSIS — D225 Melanocytic nevi of trunk: Secondary | ICD-10-CM | POA: Diagnosis not present

## 2023-01-13 DIAGNOSIS — L720 Epidermal cyst: Secondary | ICD-10-CM | POA: Diagnosis not present

## 2023-01-13 DIAGNOSIS — D485 Neoplasm of uncertain behavior of skin: Secondary | ICD-10-CM | POA: Diagnosis not present

## 2023-01-13 DIAGNOSIS — D2239 Melanocytic nevi of other parts of face: Secondary | ICD-10-CM | POA: Diagnosis not present

## 2023-01-13 DIAGNOSIS — L821 Other seborrheic keratosis: Secondary | ICD-10-CM | POA: Diagnosis not present

## 2023-01-25 ENCOUNTER — Ambulatory Visit (INDEPENDENT_AMBULATORY_CARE_PROVIDER_SITE_OTHER): Payer: Medicare Other | Admitting: Podiatry

## 2023-01-25 DIAGNOSIS — M79676 Pain in unspecified toe(s): Secondary | ICD-10-CM | POA: Diagnosis not present

## 2023-01-25 DIAGNOSIS — B351 Tinea unguium: Secondary | ICD-10-CM

## 2023-01-25 DIAGNOSIS — Z23 Encounter for immunization: Secondary | ICD-10-CM | POA: Diagnosis not present

## 2023-01-26 NOTE — Progress Notes (Signed)
She presents today chief complaint of painful elongated toenails.  She states that thickened they are yellow and painful on the sides.  Objective: Vital signs are stable she is alert and oriented x 3 pulses are palpable.  No erythema edema cellulitis drainage or odor sharp incurvated nails which are thick yellow dystrophic clinically mycotic with subungual debris and painful on palpation as well as debridement.  Assessment: Pain limb secondary to onychomycosis 1 through 5 bilateral.  Plan: Debridement of toenails 1 through 5 bilateral.

## 2023-01-27 DIAGNOSIS — N949 Unspecified condition associated with female genital organs and menstrual cycle: Secondary | ICD-10-CM | POA: Diagnosis not present

## 2023-01-27 DIAGNOSIS — Z4689 Encounter for fitting and adjustment of other specified devices: Secondary | ICD-10-CM | POA: Diagnosis not present

## 2023-02-07 ENCOUNTER — Other Ambulatory Visit: Payer: Self-pay | Admitting: Cardiovascular Disease

## 2023-02-15 ENCOUNTER — Other Ambulatory Visit: Payer: Self-pay | Admitting: Family Medicine

## 2023-02-15 DIAGNOSIS — Z1231 Encounter for screening mammogram for malignant neoplasm of breast: Secondary | ICD-10-CM

## 2023-03-21 ENCOUNTER — Ambulatory Visit
Admission: RE | Admit: 2023-03-21 | Discharge: 2023-03-21 | Disposition: A | Discharge status: Home / Self Care | Payer: Medicare Other | Source: Ambulatory Visit | Attending: Family Medicine | Admitting: Family Medicine

## 2023-03-21 DIAGNOSIS — Z1231 Encounter for screening mammogram for malignant neoplasm of breast: Secondary | ICD-10-CM | POA: Diagnosis not present

## 2023-04-25 DIAGNOSIS — Z4689 Encounter for fitting and adjustment of other specified devices: Secondary | ICD-10-CM | POA: Diagnosis not present

## 2023-04-25 DIAGNOSIS — N949 Unspecified condition associated with female genital organs and menstrual cycle: Secondary | ICD-10-CM | POA: Diagnosis not present

## 2023-04-27 DIAGNOSIS — E663 Overweight: Secondary | ICD-10-CM | POA: Diagnosis not present

## 2023-04-27 DIAGNOSIS — M48061 Spinal stenosis, lumbar region without neurogenic claudication: Secondary | ICD-10-CM | POA: Diagnosis not present

## 2023-04-27 DIAGNOSIS — M1991 Primary osteoarthritis, unspecified site: Secondary | ICD-10-CM | POA: Diagnosis not present

## 2023-04-27 DIAGNOSIS — G629 Polyneuropathy, unspecified: Secondary | ICD-10-CM | POA: Diagnosis not present

## 2023-04-27 DIAGNOSIS — Z6826 Body mass index (BMI) 26.0-26.9, adult: Secondary | ICD-10-CM | POA: Diagnosis not present

## 2023-04-27 DIAGNOSIS — R768 Other specified abnormal immunological findings in serum: Secondary | ICD-10-CM | POA: Diagnosis not present

## 2023-05-07 DIAGNOSIS — Z23 Encounter for immunization: Secondary | ICD-10-CM | POA: Diagnosis not present

## 2023-05-13 ENCOUNTER — Encounter: Payer: Self-pay | Admitting: Podiatry

## 2023-05-23 ENCOUNTER — Ambulatory Visit (INDEPENDENT_AMBULATORY_CARE_PROVIDER_SITE_OTHER): Payer: Medicare Other | Admitting: Family Medicine

## 2023-05-23 ENCOUNTER — Encounter: Payer: Self-pay | Admitting: Family Medicine

## 2023-05-23 VITALS — BP 120/72 | HR 90 | Temp 97.6°F | Ht 59.0 in | Wt 130.0 lb

## 2023-05-23 DIAGNOSIS — M810 Age-related osteoporosis without current pathological fracture: Secondary | ICD-10-CM | POA: Diagnosis not present

## 2023-05-23 DIAGNOSIS — E785 Hyperlipidemia, unspecified: Secondary | ICD-10-CM | POA: Diagnosis not present

## 2023-05-23 DIAGNOSIS — I2510 Atherosclerotic heart disease of native coronary artery without angina pectoris: Secondary | ICD-10-CM

## 2023-05-23 DIAGNOSIS — I10 Essential (primary) hypertension: Secondary | ICD-10-CM | POA: Diagnosis not present

## 2023-05-23 NOTE — Patient Instructions (Addendum)
Labs next visit  Recommended follow up: Return in about 6 months (around 11/20/2023) for followup or sooner if needed.Schedule b4 you leave.

## 2023-05-23 NOTE — Progress Notes (Signed)
Phone [phone] In person visit   Subjective:   Abigail Cohen is a 82 y.o. year old very pleasant female patient who presents for/with See problem oriented charting Chief Complaint  Patient presents with   Medical Management of Chronic Issues   Hyperlipidemia   Peripheral Neuropathy   Past Medical History-  Patient Active Problem List   Diagnosis Date Noted   nonobstructive CAD- follows with Dr. Flora Lipps cardiology  05/04/2021    Priority: High   Abdominal aortic aneurysm (AAA) (HCC) 08/28/2019    Priority: High   Hyperlipidemia 10/30/2020    Priority: Medium    Essential hypertension 10/30/2020    Priority: Medium    Osteoarthritis 10/30/2020    Priority: Medium    Osteoporosis 08/02/2016    Priority: Medium    Aortic atherosclerosis (HCC) 11/05/2021    Priority: Low   Emphysema lung (HCC) 11/05/2021    Priority: Low   Diverticulosis 11/05/2021    Priority: Low   Allergic rhinitis 10/30/2020    Priority: Low   Left inguinal hernia 06/20/2012    Priority: Low   Primary localized osteoarthrosis of multiple sites 10/21/2022   Shoulder joint pain 10/21/2022   Spinal stenosis of lumbar region 10/21/2022    Medications- reviewed and updated Current Outpatient Medications  Medication Sig Dispense Refill   acetaminophen (TYLENOL) 500 MG tablet Take 500 mg by mouth every 6 (six) hours as needed (ARTHRITIS PAIN.).     Alpha Lipoic Acid 200 MG CAPS See admin instructions.     aspirin EC 81 MG tablet Take 1 tablet (81 mg total) by mouth daily.     BENFOTIAMINE PO Take 300 mg by mouth daily.     CALCIUM CITRATE PO Take 1-2 tablets by mouth at bedtime.     Cholecalciferol (VITAMIN D3) 10 MCG (400 UNIT) tablet Take 400 Units by mouth daily.     denosumab (PROLIA) 60 MG/ML SOSY injection Inject 60 mg into the skin every 6 (six) months.     estradiol (ESTRACE) 0.1 MG/GM vaginal cream Place 1 Applicatorful vaginally 2 (two) times a week.      gabapentin (NEURONTIN) 300 MG  capsule Take 300 mg by mouth at bedtime.     ibuprofen (ADVIL) 200 MG tablet 2 tabs     Magnesium 250 MG TABS Take 250 mg by mouth daily.      metoprolol succinate (TOPROL-XL) 25 MG 24 hr tablet TAKE 1 TABLET(25 MG) BY MOUTH DAILY 90 tablet 2   Multiple Vitamin (MULTIVITAMIN WITH MINERALS) TABS tablet Take 1 tablet by mouth daily.     polyethylene glycol (MIRALAX / GLYCOLAX) 17 g packet 1/2 packet mixed with 8 ounces of fluid     rosuvastatin (CRESTOR) 10 MG tablet TAKE 1 TABLET(10 MG) BY MOUTH DAILY 90 tablet 3   No current facility-administered medications for this visit.     Objective:  BP 120/72   Pulse 90   Temp 97.6 F (36.4 C)   Ht 4\' 11"  (1.499 m)   Wt 130 lb (59 kg)   LMP  (LMP Unknown)   SpO2 96%   BMI 26.26 kg/m  Gen: NAD, resting comfortably CV: RRR no murmurs rubs or gallops Lungs: CTAB no crackles, wheeze, rhonchi Ext: trace edema under compression Skin: warm, dry Neuro: walks with walker    Assessment and Plan   #Abdominal aortic aneurysm without rupture with history of open repair-follows with Dr. Flora Lipps of cardiology as well as vascular surgery S:Medication: Aspirin and statin recommended per  cardiology. She is on both A/P: stability on 05/24/22 and scheduled  with April 2025- about 18 months out  #Coronary artery disease based on coronary artery calcification seen on CT chest with reassuring stress test and echocardiogram-follows with Dr. Bufford Buttner of cardiology #hyperlipidemia- LDL goal under 70 S: Medication:Rosuvastatin 10 mg daily , aspirin 81mg  -no chest pain. Stable shortness of breath - mild emphysema on scan Lab Results  Component Value Date   CHOL 134 10/27/2022   HDL 62.50 10/27/2022   LDLCALC 60 10/27/2022   TRIG 56.0 10/27/2022   CHOLHDL 2 10/27/2022  A/P: coronary artery disease largely asymptomatic continue current medications  Lipids at goal- continue current medications   #hypertension S: medication: Metoprolol succinate 25 mg twice  daily BP Readings from Last 3 Encounters:  05/23/23 120/72  11/17/22 124/76  11/12/22 124/72  A/P: stable- continue current medicines    # Osteoporosis- sees Dr. Lafe Garin S: Last DEXA: 06/24/22  Medication (bisphosphonate or prolia):  prolia started 2018. Had been on fosamax 2015-2017 without clear benefit with plan for 1-2 year reclast at the end  Calcium: 1200mg  (through diet ok) recommended  Vitamin D: 1000 units a day recommended Last vitamin D A/P: doing well- continue current medications and endocrinology follow up    #Osteoarthritis- follows with Dr. Nickola Major of rheumatology. Bilateral knee replacements, hammer toes, bunions, back, scoliosis. No regular prescription - she tolerates it -dental prophylaxis from dentist- started out with orthopedist  #Pessary through ob/gyn and also on estrace cream   # neuropathy- on alpha lipoic acid through Dr. Elvera Lennox originally patient reports. also sees podiatry and has hammer toes . some balance issues- does use a cane or walker -this continues to bother her and seems to be worse with time. Equal on both legs.  -TSH and B12 have been normal -does have spinal stenosis which could be a potential cause or contributing factor but apparently does not have many surgical options.   #Forehead polyp/supraorbital 1 cm osteoma- seems overall stable   #Adnexal cyst- follow up with [facility] gynecology- as long as stable they are planning to avoid surgery - first noted in 2019 - reports plan for yearly imaging  Recommended follow up: Return in about 6 months (around 11/20/2023) for followup or sooner if needed.Schedule b4 you leave. Future Appointments  Date Time Provider Department Center  05/24/2023  1:30 PM [facility] NURSE [facility] None  05/30/2023  2:00 PM [facility] ANNUAL WELLNESS VISIT 1 [facility]EC  06/07/2023 11:15 AM Hyatt, Max T, DPM [facility]  08/22/2023  1:00 PM Carlus Pavlov, MD [facility] None  10/12/2023  9:00 AM [facility]  VASC 3 [facility]   Lab/Order associations:   ICD-10-CM   1. Essential hypertension  I10     2. Hyperlipidemia, unspecified hyperlipidemia type  E78.5     3. Coronary artery disease involving native coronary artery of native heart without angina pectoris  I25.10     4. Osteoporosis without current pathological fracture, unspecified osteoporosis type  M81.0      No orders of the defined types were placed in this encounter.   Return precautions advised.  Tana Conch, MD

## 2023-05-24 ENCOUNTER — Ambulatory Visit (INDEPENDENT_AMBULATORY_CARE_PROVIDER_SITE_OTHER): Payer: Medicare Other

## 2023-05-24 DIAGNOSIS — M810 Age-related osteoporosis without current pathological fracture: Secondary | ICD-10-CM

## 2023-05-24 MED ORDER — DENOSUMAB 60 MG/ML ~~LOC~~ SOSY
60.0000 mg | PREFILLED_SYRINGE | Freq: Once | SUBCUTANEOUS | Status: AC
  Administered 2023-11-22: 60 mg via SUBCUTANEOUS

## 2023-05-24 MED ORDER — DENOSUMAB 60 MG/ML ~~LOC~~ SOSY
60.0000 mg | PREFILLED_SYRINGE | Freq: Once | SUBCUTANEOUS | Status: AC
  Administered 2023-05-24: 60 mg via SUBCUTANEOUS

## 2023-05-24 NOTE — Progress Notes (Signed)
After obtaining consent, and per orders of Dr. Elvera Lennox, injection of Prolia given by Pollie Meyer. Patient instructed to remain in clinic for 20 minutes afterwards, and to report any adverse reaction to me immediately.

## 2023-05-28 NOTE — Telephone Encounter (Signed)
Prior Authorization NOT required for PROLIA - covered benefit  Primary Medicare  COVERAGE DETAILS: For the Primary MD Purchase access option, benefits are subject to a $240  deductible ($240 met) and 20% co-insurance for the administration and cost of Prolia.    Secondary BCBS [place] Medicare Supplement  COVERAGE DETAILS: For the Secondary MD Purchase access option, this plan is a Medicare  Supplement Plan F and it covers the Medicare Part B deductible, co-insurance and 100% of the excess  charges.

## 2023-05-28 NOTE — Telephone Encounter (Signed)
Last Prolia inj 05/24/23 Next Prolia inj due 11/22/23

## 2023-05-28 NOTE — Telephone Encounter (Signed)
Pt ready for scheduling on or after 05/21/23  Out-of-pocket cost due at time of visit: $0  Primary: Medicare Prolia co-insurance: 20% (approximately $320.99) Admin fee co-insurance: 20% (approximately $25)  Deductible: $240 of $240 met  Prior Auth: NOT required PA# Valid:   Secondary: BCBS [place] Medicare Supplement Plan F Prolia co-insurance: Covers Medicare Part B co-insurance Admin fee co-insurance: Covers Medicare Part B co-insurance  Deductible:  Covered by secondary  Prior Auth: NOT required PA# Valid:   ** This summary of benefits is an estimation of the patient's out-of-pocket cost. Exact cost may vary based on individual plan coverage.

## 2023-05-31 ENCOUNTER — Ambulatory Visit: Payer: Medicare Other | Admitting: Podiatry

## 2023-06-07 ENCOUNTER — Ambulatory Visit: Payer: Medicare Other | Admitting: Podiatry

## 2023-06-10 DIAGNOSIS — S0101XA Laceration without foreign body of scalp, initial encounter: Secondary | ICD-10-CM | POA: Diagnosis not present

## 2023-06-10 DIAGNOSIS — W19XXXA Unspecified fall, initial encounter: Secondary | ICD-10-CM | POA: Diagnosis not present

## 2023-06-14 ENCOUNTER — Encounter: Payer: Self-pay | Admitting: Podiatry

## 2023-06-14 ENCOUNTER — Ambulatory Visit (INDEPENDENT_AMBULATORY_CARE_PROVIDER_SITE_OTHER): Payer: Medicare Other | Admitting: Podiatry

## 2023-06-14 DIAGNOSIS — B351 Tinea unguium: Secondary | ICD-10-CM | POA: Diagnosis not present

## 2023-06-14 DIAGNOSIS — M79676 Pain in unspecified toe(s): Secondary | ICD-10-CM | POA: Diagnosis not present

## 2023-06-14 NOTE — Progress Notes (Signed)
Presents today chief complaint of painful elongated toenails 1 through 5 bilateral.  Objective: Pulses are palpable.  Toenails are long thick dystrophic onychomycotic sharply incurvated painful palpation as well as debridement.  Assessment: Pain in limb secondary to onychomycosis.  Plan: Debridement of toenails 1 through 5 bilateral.

## 2023-06-23 ENCOUNTER — Other Ambulatory Visit: Payer: Self-pay | Admitting: Cardiovascular Disease

## 2023-08-15 DIAGNOSIS — M48062 Spinal stenosis, lumbar region with neurogenic claudication: Secondary | ICD-10-CM | POA: Diagnosis not present

## 2023-08-22 ENCOUNTER — Ambulatory Visit (INDEPENDENT_AMBULATORY_CARE_PROVIDER_SITE_OTHER): Payer: Medicare Other | Admitting: Internal Medicine

## 2023-08-22 ENCOUNTER — Encounter: Payer: Self-pay | Admitting: Internal Medicine

## 2023-08-22 VITALS — BP 120/70 | HR 96 | Ht 59.0 in | Wt 130.8 lb

## 2023-08-22 DIAGNOSIS — G6289 Other specified polyneuropathies: Secondary | ICD-10-CM | POA: Diagnosis not present

## 2023-08-22 DIAGNOSIS — M810 Age-related osteoporosis without current pathological fracture: Secondary | ICD-10-CM | POA: Diagnosis not present

## 2023-08-22 NOTE — Patient Instructions (Signed)
Please continue Prolia.  Stop at the lab.  Please come back for a follow-up appointment in 1 year.

## 2023-08-22 NOTE — Progress Notes (Signed)
 Patient ID: Abigail Cohen, female   DOB: Feb 01, 1941, 83 y.o.   MRN: [id number]   HPI  Abigail Cohen is a 83 y.o.-year-old very pleasant female, returning for follow-up for osteoporosis.  Last visit 1 year ago.  Interim history: No falls or fractures since last visit.  She walks with a walker.  She still has significant neuropathic pain. She ha R shoulder pain >> has acupuncture (Dr. Lester Cohen in [facility]). She will start PT.  She was diagnosed with osteoporosis in 2015.  Reviewed previous DXA scan reports: Date L1-L4 T score FN T score 33% distal Radius UD radius  [facility] N/a RFN: -1.3 LFN: -1.5 -2.9 (+4.0%) -2.0   06/23/2020 ([facility]) n/a RFN: -1.4 LFN: -0.9 -3.3 -2.2  06/22/2018 ([facility]) n/a RFN: -1.4 LFN: -1.4 -3.1 -2.2  05/10/2016 (Lunar) n/a RFN: -2.0 LFN: -1.5 -3.1 -2.8  05/09/2014 ([facility]) n/a RFN: n/a LFN: -1.7 -2.6 -1.3  04/05/2012 ([facility]) n/a RFN: -1.7 -2.2   11/02/2006 (Lunar) n/a RFN: -1.6 -2.1   10/04/2001 ([facility])  -0.5 LFN: -0.8 n/a    No fractures since last visit.  She denies dizziness, vertigo, orthostasis, poor vision.  She had an endplate L1 vertebral fx (vb bone height loss of up to 80%) - MRI 05/28/2019. She continues to have sciatica.  She had previous steroid injections.  She had physical therapy for this.  Reviewed osteoporotic treatment: - Fosamax 2015-06/2016 - Prolia: 08/2016 02/2017 09/2017 03/2018 09/13/2018 03/20/2019 10/02/2019 04/10/2020 10/10/2020 04/14/2021  11/11/2021 05/18/2022 11/17/2022 05/24/2023  She denies: Jaw, hip, thigh pain.  No history of vitamin D deficiency: Lab Results  Component Value Date   VD25OH 48.57 08/19/2022   VD25OH 39.41 08/13/2021   VD25OH 54.4 08/07/2020   VD25OH 40.21 08/08/2019   VD25OH 47.97 08/02/2018   VD25OH 48.18 08/02/2017   VD25OH 41.40 08/04/2016  11/24/2015: vit D 49.2 06/03/2014: Vit D 39.7  She continues on: - vitamin D 400 units  daily - calcium citrate 250-500 mg daily - multivitamin daily  She was previously doing weightbearing exercises - [facility] on [address]. She was then exercising at home on her glider, but at last visit she had back pain and could not exercise >> restarted exercise as back pain is resolved after the AAA surgery.   Menopause was in her 30s.  No family history of osteoporosis.  No history of kidney stones.  She had hypocalcemia in 2021 in the setting of aortic aneurysm repair surgery: Lab Results  Component Value Date   PTH 29 08/04/2016   CALCIUM 9.3 10/27/2022   CALCIUM 9.5 08/19/2022   CALCIUM 9.8 11/05/2021   CALCIUM 9.9 08/13/2021   CALCIUM 10.0 09/22/2020   CALCIUM 9.2 08/07/2020   CALCIUM 7.1 (L) 08/31/2019   CALCIUM 7.1 (L) 08/29/2019   CALCIUM 7.9 (L) 08/28/2019   CALCIUM 9.1 08/27/2019   No history of thyrotoxicosis: Lab Results  Component Value Date   TSH 1.19 09/22/2020   TSH 0.54 08/04/2016   No history of CKD. Last BUN/Cr: Lab Results  Component Value Date   BUN 23 10/27/2022   CREATININE 0.64 10/27/2022   She had one steroid injection for hip bursitis in the past, but not recently.  She has peripheral neuropathy which unclear etiology.  At our first visit, I advised her to start alpha-lipoic acid and she takes 200 mg daily,  and also benfothiamine.    ROS: + see HPI Neurological: no tremors/+ numbness/+ tingling/no dizziness  I reviewed pt's medications, allergies, PMH, social hx, family hx, and changes were documented in the history of present illness. Otherwise, unchanged from my initial visit note.  Past Medical History:  Diagnosis Date   AAA (abdominal aortic aneurysm) (HCC)    Allergy    Essential hypertension h   Family history of anesthesia complication    daughter gets nauseated   Hearing loss    wears hearing aids   Hypertension    Knee joint replacement status    Osteoarthritis    Osteopenia    Osteoporosis     Presence of intracervical pessary    Uterine prolapse    Wears glasses    Wears partial dentures    upper and lower partial   Past Surgical History:  Procedure Laterality Date   ABDOMINAL AORTIC ANEURYSM REPAIR N/A 08/28/2019   Procedure: ANEURYSM ABDOMINAL AORTIC REPAIR OPEN;  Surgeon: Abigail Hint, MD;  Location: [facility] OR;  Service: Vascular;  Laterality: N/A;   COLONOSCOPY     EYE SURGERY     cataracts removal   HERNIA REPAIR  07/07/12   Dr Abigail Cohen HERNIA REPAIR  07/07/2012   Procedure: HERNIA REPAIR INGUINAL ADULT;  Surgeon: Abigail Paris, MD;  Location: [facility];  Service: General;  Laterality: Left;  left inguinal area   REPLACEMENT TOTAL KNEE BILATERAL  2003   TONSILLECTOMY  1945   WISDOM TOOTH EXTRACTION     Social History   Social History   Marital status: Divorced    Spouse name: N/A   Number of children: N/A   Years of education: N/A   Occupational History   Not on file.   Social History Main Topics   Smoking status: Former Smoker    Start date: 07/05/2004    Quit date: 06/30/2005   Smokeless tobacco: Never Used   Alcohol use Yes     Comment: social   Drug use: No   Current Outpatient Medications  Medication Sig Dispense Refill   acetaminophen (TYLENOL) 500 MG tablet Take 500 mg by mouth every 6 (six) hours as needed (ARTHRITIS PAIN.).     Alpha Lipoic Acid 200 MG CAPS See admin instructions.     aspirin EC 81 MG tablet Take 1 tablet (81 mg total) by mouth daily.     BENFOTIAMINE PO Take 300 mg by mouth daily.     CALCIUM CITRATE PO Take 1-2 tablets by mouth at bedtime.     Cholecalciferol (VITAMIN D3) 10 MCG (400 UNIT) tablet Take 400 Units by mouth daily.     denosumab (PROLIA) 60 MG/ML SOSY injection Inject 60 mg into the skin every 6 (six) months.     estradiol (ESTRACE) 0.1 MG/GM vaginal cream Place 1 Applicatorful vaginally 2 (two) times a week.      gabapentin (NEURONTIN) 300 MG capsule Take 300 mg by mouth at  bedtime.     ibuprofen (ADVIL) 200 MG tablet 2 tabs     Magnesium 250 MG TABS Take 250 mg by mouth daily.      metoprolol succinate (TOPROL-XL) 25 MG 24 hr tablet TAKE 1 TABLET(25 MG) BY MOUTH DAILY 90 tablet 2   Multiple Vitamin (MULTIVITAMIN WITH MINERALS) TABS tablet Take 1 tablet by mouth daily.     polyethylene glycol (MIRALAX / GLYCOLAX) 17 g packet 1/2 packet mixed with 8 ounces of fluid     rosuvastatin (CRESTOR) 10 MG tablet TAKE 1 TABLET(10 MG) BY MOUTH DAILY 90 tablet 1  Current Facility-Administered Medications  Medication Dose Route Frequency Provider Last Rate Last Admin   [START ON 11/21/2023] denosumab (PROLIA) injection 60 mg  60 mg Subcutaneous Once Carlus Pavlov, MD       Allergies  Allergen Reactions   Penicillins     Blood shot eyes, felt poorly.  Tolerates amoxicillin with dentist   Family History  Problem Relation Age of Onset   Heart disease Mother    Heart attack Mother        90s   Arrhythmia Mother    Heart disease Father        34s   Heart attack Father    Cancer Sister        endometrial   PE: BP 120/70   Pulse 96   Ht 4\' 11"  (1.499 m)   Wt 130 lb 12.8 oz (59.3 kg)   LMP  (LMP Unknown)   SpO2 94%   BMI 26.42 kg/m  Wt Readings from Last 3 Encounters:  08/22/23 130 lb 12.8 oz (59.3 kg)  05/23/23 130 lb (59 kg)  11/17/22 128 lb 3.2 oz (58.2 kg)   Constitutional: normal weight, in NAD, + uses a walker Eyes: EOMI, no exophthalmos ENT: no thyromegaly, no cervical lymphadenopathy Cardiovascular: No tachycardia at the time of the exam RRR, No MRG Respiratory: CTA B Musculoskeletal: no deformities Skin: no rashes Neurological: no tremor with outstretched hands  Assessment: 1. Osteoporosis  2.  Peripheral neuropathy  Plan: 1. Osteoporosis -Likely age-related, postmenopausal -Her bone density report from 06/2020 showed a significant increase in T-score at the left femoral neck, stability at the 33% distal radius in the right femoral  neck.  The spine could not be analyzed.  She had another bone density report in 06/2022 in which the distal radius and right femoral neck T-scores appears to be improved, but there was a decrease in the level of the left femoral neck.  However, looking at the previous T-scores for the LFN, the score obtained in 06/2022 was more consistent with a score obtained in 2019 and also prior to this, so it appears that the 2021 T-score was actually an outlier. -We have her on Prolia, which she tolerates well.  She has no jaw/thigh/hip pain.  She previously had 2 dental extractions while on Prolia and these healed well -No falls or fractures since last visit.  She is aware of fall precautions.  She walks outside with a walker. -She could not start weightbearing exercises due to back pain.  She was previously using a glider after her back pain improved after surgery.  I recommended to combine this with weightbearing exercises.  Previously had physical therapy.  Currently using the glider and walking 6000 steps a day.  We again discussed about starting upper body weightbearing exercises.  This is difficult for her to do due to right shoulder pain.  However, she is planning to start physical therapy.  I advised him to let the trainers know that she has osteoporosis. -She still has peripheral neuropathy and she is at high risk for falls. -Reviewed latest vitamin D level and this was normal at last check: 48.57 -We will recheck her vitamin D today.  She has an appointment coming up with Dr. Durene Cal in 11/2023 and usually has a BMP checked at that time. -I will see her back in a year  2.  Peripheral neuropathy -Likely related to spinal osteoarthritis with intervertebral compression -At last visit she was taking alpha-lipoic acid 200 mg daily.  We  discussed that the recommended dose is actually higher, 600 mg twice a day.  I wrote this down for her as she wanted to try it.  She is not taking this dose.  She also works with  neurology-recently her Neurontin dose was recommended to be increased to twice a day.  We discussed about making sure that she is not dizzy while taking the dose in the morning.  Orders Placed This Encounter  Procedures   VITAMIN D 25 Hydroxy (Vit-D Deficiency, Fractures)   Carlus Pavlov, MD PhD [facility] Endocrinology

## 2023-08-23 ENCOUNTER — Encounter: Payer: Self-pay | Admitting: Internal Medicine

## 2023-08-23 LAB — VITAMIN D 25 HYDROXY (VIT D DEFICIENCY, FRACTURES): Vit D, 25-Hydroxy: 51 ng/mL (ref 30–100)

## 2023-08-29 ENCOUNTER — Encounter: Payer: Self-pay | Admitting: Family Medicine

## 2023-08-29 DIAGNOSIS — M48061 Spinal stenosis, lumbar region without neurogenic claudication: Secondary | ICD-10-CM | POA: Diagnosis not present

## 2023-09-01 DIAGNOSIS — M48061 Spinal stenosis, lumbar region without neurogenic claudication: Secondary | ICD-10-CM | POA: Diagnosis not present

## 2023-09-05 DIAGNOSIS — M48061 Spinal stenosis, lumbar region without neurogenic claudication: Secondary | ICD-10-CM | POA: Diagnosis not present

## 2023-09-08 DIAGNOSIS — M4801 Spinal stenosis, occipito-atlanto-axial region: Secondary | ICD-10-CM | POA: Diagnosis not present

## 2023-09-09 DIAGNOSIS — Z4689 Encounter for fitting and adjustment of other specified devices: Secondary | ICD-10-CM | POA: Diagnosis not present

## 2023-09-12 DIAGNOSIS — M48061 Spinal stenosis, lumbar region without neurogenic claudication: Secondary | ICD-10-CM | POA: Diagnosis not present

## 2023-09-13 ENCOUNTER — Ambulatory Visit (INDEPENDENT_AMBULATORY_CARE_PROVIDER_SITE_OTHER): Payer: Medicare Other | Admitting: Podiatry

## 2023-09-13 DIAGNOSIS — M79676 Pain in unspecified toe(s): Secondary | ICD-10-CM | POA: Diagnosis not present

## 2023-09-13 DIAGNOSIS — B351 Tinea unguium: Secondary | ICD-10-CM

## 2023-09-13 NOTE — Progress Notes (Signed)
 She presents today chief complaint of painful elongated toenails.  Objective: Pulses are palpable.  There is no erythema edema cellulitis drainage or odor.  Nails are long thick yellow dystrophic onychomycotic.  Assessment: Pain limb secondary to onychomycosis.  Plan: Debridement of toenails 1 through 5 bilateral.

## 2023-09-15 DIAGNOSIS — M48061 Spinal stenosis, lumbar region without neurogenic claudication: Secondary | ICD-10-CM | POA: Diagnosis not present

## 2023-09-19 DIAGNOSIS — M48061 Spinal stenosis, lumbar region without neurogenic claudication: Secondary | ICD-10-CM | POA: Diagnosis not present

## 2023-09-22 DIAGNOSIS — M48061 Spinal stenosis, lumbar region without neurogenic claudication: Secondary | ICD-10-CM | POA: Diagnosis not present

## 2023-09-28 DIAGNOSIS — M48061 Spinal stenosis, lumbar region without neurogenic claudication: Secondary | ICD-10-CM | POA: Diagnosis not present

## 2023-10-03 DIAGNOSIS — M48061 Spinal stenosis, lumbar region without neurogenic claudication: Secondary | ICD-10-CM | POA: Diagnosis not present

## 2023-10-06 DIAGNOSIS — M48061 Spinal stenosis, lumbar region without neurogenic claudication: Secondary | ICD-10-CM | POA: Diagnosis not present

## 2023-10-11 DIAGNOSIS — M48061 Spinal stenosis, lumbar region without neurogenic claudication: Secondary | ICD-10-CM | POA: Diagnosis not present

## 2023-10-12 ENCOUNTER — Ambulatory Visit (HOSPITAL_COMMUNITY)
Admission: RE | Admit: 2023-10-12 | Discharge: 2023-10-12 | Disposition: A | Discharge status: Home / Self Care | Payer: Medicare Other | Source: Ambulatory Visit | Attending: Cardiovascular Disease | Admitting: Cardiovascular Disease

## 2023-10-12 DIAGNOSIS — Z8679 Personal history of other diseases of the circulatory system: Secondary | ICD-10-CM | POA: Diagnosis not present

## 2023-10-12 DIAGNOSIS — Z95828 Presence of other vascular implants and grafts: Secondary | ICD-10-CM | POA: Diagnosis not present

## 2023-10-12 DIAGNOSIS — Z9889 Other specified postprocedural states: Secondary | ICD-10-CM | POA: Insufficient documentation

## 2023-10-13 ENCOUNTER — Encounter: Payer: Self-pay | Admitting: Cardiovascular Disease

## 2023-10-18 DIAGNOSIS — M4801 Spinal stenosis, occipito-atlanto-axial region: Secondary | ICD-10-CM | POA: Diagnosis not present

## 2023-10-19 NOTE — Telephone Encounter (Signed)
 Prolia VOB initiated via [email or web address]  Next Prolia inj DUE: 11/22/23

## 2023-10-24 DIAGNOSIS — M48061 Spinal stenosis, lumbar region without neurogenic claudication: Secondary | ICD-10-CM | POA: Diagnosis not present

## 2023-10-25 DIAGNOSIS — E663 Overweight: Secondary | ICD-10-CM | POA: Diagnosis not present

## 2023-10-25 DIAGNOSIS — M48061 Spinal stenosis, lumbar region without neurogenic claudication: Secondary | ICD-10-CM | POA: Diagnosis not present

## 2023-10-25 DIAGNOSIS — M1991 Primary osteoarthritis, unspecified site: Secondary | ICD-10-CM | POA: Diagnosis not present

## 2023-10-25 DIAGNOSIS — Z6826 Body mass index (BMI) 26.0-26.9, adult: Secondary | ICD-10-CM | POA: Diagnosis not present

## 2023-10-25 DIAGNOSIS — G629 Polyneuropathy, unspecified: Secondary | ICD-10-CM | POA: Diagnosis not present

## 2023-10-25 DIAGNOSIS — R768 Other specified abnormal immunological findings in serum: Secondary | ICD-10-CM | POA: Diagnosis not present

## 2023-10-25 DIAGNOSIS — R2231 Localized swelling, mass and lump, right upper limb: Secondary | ICD-10-CM | POA: Diagnosis not present

## 2023-10-26 DIAGNOSIS — M48061 Spinal stenosis, lumbar region without neurogenic claudication: Secondary | ICD-10-CM | POA: Diagnosis not present

## 2023-11-01 DIAGNOSIS — M48061 Spinal stenosis, lumbar region without neurogenic claudication: Secondary | ICD-10-CM | POA: Diagnosis not present

## 2023-11-03 DIAGNOSIS — M48061 Spinal stenosis, lumbar region without neurogenic claudication: Secondary | ICD-10-CM | POA: Diagnosis not present

## 2023-11-06 NOTE — Telephone Encounter (Signed)
 Patient is ready for scheduling on or after 11/22/23 BUY AND BILL  Out-of-pocket cost due at time of visit: $0  Primary: [place] Medicare A&B Prolia  co-insurance: 20% (approximately $331.87) Admin fee co-insurance: 20% (approximately $25)  Deductible: $257 of $257 met  Prior Auth: NOT required  Secondary: [employer] [place] Medicare Supplement Plan F Prolia  co-insurance: Covers Medicare Part B co-insurance Admin fee co-insurance: Covers Medicare Part B co-insurance  Deductible:  Covered by secondary  Prior Auth: NOT required PA# Valid:   ** This summary of benefits is an estimation of the patient's out-of-pocket cost. Exact cost may vary based on individual plan coverage.

## 2023-11-06 NOTE — Telephone Encounter (Signed)
 Prior Authorization NOT required for [facility]

## 2023-11-10 ENCOUNTER — Other Ambulatory Visit: Payer: Self-pay | Admitting: Cardiovascular Disease

## 2023-11-10 DIAGNOSIS — M48061 Spinal stenosis, lumbar region without neurogenic claudication: Secondary | ICD-10-CM | POA: Diagnosis not present

## 2023-11-14 ENCOUNTER — Ambulatory Visit: Attending: Nurse Practitioner | Admitting: Nurse Practitioner

## 2023-11-14 ENCOUNTER — Encounter: Payer: Self-pay | Admitting: Nurse Practitioner

## 2023-11-14 VITALS — BP 138/82 | HR 91 | Ht <= 58 in | Wt 128.0 lb

## 2023-11-14 DIAGNOSIS — I2510 Atherosclerotic heart disease of native coronary artery without angina pectoris: Secondary | ICD-10-CM | POA: Insufficient documentation

## 2023-11-14 DIAGNOSIS — I340 Nonrheumatic mitral (valve) insufficiency: Secondary | ICD-10-CM | POA: Diagnosis not present

## 2023-11-14 DIAGNOSIS — Z9889 Other specified postprocedural states: Secondary | ICD-10-CM | POA: Diagnosis not present

## 2023-11-14 DIAGNOSIS — I10 Essential (primary) hypertension: Secondary | ICD-10-CM | POA: Diagnosis not present

## 2023-11-14 DIAGNOSIS — E785 Hyperlipidemia, unspecified: Secondary | ICD-10-CM | POA: Insufficient documentation

## 2023-11-14 DIAGNOSIS — I361 Nonrheumatic tricuspid (valve) insufficiency: Secondary | ICD-10-CM | POA: Insufficient documentation

## 2023-11-14 DIAGNOSIS — I714 Abdominal aortic aneurysm, without rupture, unspecified: Secondary | ICD-10-CM | POA: Insufficient documentation

## 2023-11-14 DIAGNOSIS — Z8679 Personal history of other diseases of the circulatory system: Secondary | ICD-10-CM | POA: Insufficient documentation

## 2023-11-14 NOTE — Progress Notes (Signed)
 Office Visit    Patient Name: Abigail Cohen Date of Encounter: 11/14/2023  Primary Care Provider:  Almira Jaeger, MD Primary Cardiologist:  Oneil Bigness, MD  Chief Complaint    83 year old female with a history of infrarenal AAA s/p open repair in 08/2019, coronary artery calcification, mild mitral valve regurgitation, TR, hypertension, hyperlipidemia, osteoporosis, and former tobacco use who presents for follow-up related to AAA.  Past Medical History    Past Medical History:  Diagnosis Date   AAA (abdominal aortic aneurysm) (HCC)    Allergy    Essential hypertension h   Family history of anesthesia complication    daughter gets nauseated   Hearing loss    wears hearing aids   Hypertension    Knee joint replacement status    Osteoarthritis    Osteopenia    Osteoporosis    Presence of intracervical pessary    Uterine prolapse    Wears glasses    Wears partial dentures    upper and lower partial   Past Surgical History:  Procedure Laterality Date   ABDOMINAL AORTIC ANEURYSM REPAIR N/A 08/28/2019   Procedure: ANEURYSM ABDOMINAL AORTIC REPAIR OPEN;  Surgeon: Dannis Dy, MD;  Location: [facility] OR;  Service: Vascular;  Laterality: N/A;   COLONOSCOPY     EYE SURGERY     cataracts removal   HERNIA REPAIR  07/07/12   Dr Vera Gip HERNIA REPAIR  07/07/2012   Procedure: HERNIA REPAIR INGUINAL ADULT;  Surgeon: Darcella Earnest, MD;  Location: [facility];  Service: General;  Laterality: Left;  left inguinal area   REPLACEMENT TOTAL KNEE BILATERAL  2003   TONSILLECTOMY  1945   WISDOM TOOTH EXTRACTION      Allergies  Allergies  Allergen Reactions   Penicillins     Blood shot eyes, felt poorly.  Tolerates amoxicillin with dentist     Labs/Other Studies Reviewed    The following studies were reviewed today:  Cardiac Studies & Procedures    ______________________________________________________________________________________________   STRESS TESTS  MYOCARDIAL PERFUSION IMAGING 07/19/2019  Narrative  The left ventricular ejection fraction is hyperdynamic (>65%).  Nuclear stress EF: 70%.  There was no ST segment deviation noted during stress.  The study is normal.  This is a low risk study.  1. Normal myocardial perfusion imaging study without evidence of ischemia or prior infarction. 2. Normal left ventricular ejection fraction, greater than 65%.  Melodee Spruce T. Rolm Clos, MD [facility] [address], [address] [place], [place] [address] [phone] 12:48 PM   ECHOCARDIOGRAM  ECHOCARDIOGRAM COMPLETE 07/12/2019  Narrative ECHOCARDIOGRAM REPORT    Patient Name:   Abigail Cohen Date of Exam: 07/12/2019 Medical Rec #:  [id number]     Height:       58.0 in Accession #:    [id number]    Weight:       128.8 lb Date of Birth:  05-09-1941     BSA:          1.51 m Patient Age:    78 years      BP:           168/99 mmHg Patient Gender: F             HR:           82 bpm. Exam Location:  [address]  Procedure: 2D Echo, Cardiac Doppler and Color Doppler  Indications:    R06.00 Dyspnea  History:  Patient has no prior history of Echocardiogram examinations. CAD; Risk Factors:Former Smoker. AAA.  Sonographer:    Mylinda Asa RCS Referring Phys: [id number] Cathay Clonts O'NEAL  IMPRESSIONS   1. Left ventricular ejection fraction, by visual estimation, is 55 to 60%. The left ventricle has normal function. There is mildly increased left ventricular hypertrophy. 2. Global right ventricle has normal systolic function.The right ventricular size is normal. No increase in right ventricular wall thickness. 3. Left atrial size was normal. 4. Right atrial size was mildly dilated. 5. The mitral valve is normal in structure. Mild mitral valve regurgitation. 6. The tricuspid valve is normal in  structure. Mild to moderate regurgitation. 7. The aortic valve is tricuspid. Aortic valve regurgitation is not visualized. No evidence of aortic valve stenosis. 8. The pulmonic valve was not well visualized. Pulmonic valve regurgitation is trivial. 9. The tricuspid regurgitant velocity is 2.34 m/s, and with an assumed right atrial pressure of 3 mmHg, the estimated right ventricular systolic pressure is normal at 24.9 mmHg. 10. The inferior vena cava is normal in size with greater than 50% respiratory variability, suggesting right atrial pressure of 3 mmHg.  FINDINGS Left Ventricle: Left ventricular ejection fraction, by visual estimation, is 55 to 60%. The left ventricle has normal function. The left ventricle has no regional wall motion abnormalities. There is mildly increased left ventricular hypertrophy. Left ventricular diastolic parameters were normal.  Right Ventricle: The right ventricular size is normal. No increase in right ventricular wall thickness. Global RV systolic function is has normal systolic function. The tricuspid regurgitant velocity is 2.34 m/s, and with an assumed right atrial pressure of 3 mmHg, the estimated right ventricular systolic pressure is normal at 24.9 mmHg.  Left Atrium: Left atrial size was normal in size.  Right Atrium: Right atrial size was mildly dilated Prominent Eustachian valve.  Pericardium: There is no evidence of pericardial effusion.  Mitral Valve: The mitral valve is normal in structure. Mild mitral valve regurgitation.  Tricuspid Valve: The tricuspid valve is normal in structure. Tricuspid valve regurgitation mild-moderate.  Aortic Valve: The aortic valve is tricuspid. Aortic valve regurgitation is not visualized. The aortic valve is structurally normal, with no evidence of sclerosis or stenosis.  Pulmonic Valve: The pulmonic valve was not well visualized. Pulmonic valve regurgitation is trivial. Pulmonic regurgitation is trivial.  Aorta: The  aortic root and ascending aorta are structurally normal, with no evidence of dilitation.  Venous: The inferior vena cava is normal in size with greater than 50% respiratory variability, suggesting right atrial pressure of 3 mmHg.  IAS/Shunts: The interatrial septum was not well visualized.   LEFT VENTRICLE PLAX 2D LVIDd:         3.00 cm  Diastology LVIDs:         2.20 cm  LV e' lateral:   10.10 cm/s LV PW:         0.90 cm  LV E/e' lateral: 8.0 LV IVS:        1.20 cm  LV e' medial:    5.00 cm/s LVOT diam:     1.95 cm  LV E/e' medial:  16.1 LV SV:         19 ml LV SV Index:   12.01 LVOT Area:     2.99 cm   RIGHT VENTRICLE RV Basal diam:  3.40 cm RV S prime:     11.90 cm/s TAPSE (M-mode): 2.0 cm RVSP:           24.9 mmHg  LEFT ATRIUM  Index       RIGHT ATRIUM           Index LA diam:        2.70 cm 1.79 cm/m  RA Pressure: 3.00 mmHg LA Vol (A2C):   64.6 ml 42.77 ml/m RA Area:     16.00 cm LA Vol (A4C):   29.6 ml 19.60 ml/m RA Volume:   40.90 ml  27.08 ml/m LA Biplane Vol: 45.7 ml 30.26 ml/m AORTIC VALVE LVOT Vmax:   127.00 cm/s LVOT Vmean:  78.700 cm/s LVOT VTI:    0.260 m  AORTA Ao Root diam: 2.50 cm  MITRAL VALVE                         TRICUSPID VALVE MV Area (PHT): 3.53 cm              TR Peak grad:   21.9 mmHg MV PHT:        62.35 msec            TR Vmax:        234.00 cm/s MV Decel Time: 215 msec              Estimated RAP:  3.00 mmHg MV E velocity: 80.40 cm/s  103 cm/s  RVSP:           24.9 mmHg MV A velocity: 103.00 cm/s 70.3 cm/s MV E/A ratio:  0.78        1.5       SHUNTS Systemic VTI:  0.26 m Systemic Diam: 1.95 cm   Carson Clara MD Electronically signed by Carson Clara MD Signature Date/Time: 07/13/2019/1:21:59 AM    Final          ______________________________________________________________________________________________     Recent Labs: No results found for requested labs within last 365 days.  Recent  Lipid Panel    Component Value Date/Time   CHOL 134 10/27/2022 1127   TRIG 56.0 10/27/2022 1127   HDL 62.50 10/27/2022 1127   CHOLHDL 2 10/27/2022 1127   VLDL 11.2 10/27/2022 1127   LDLCALC 60 10/27/2022 1127    History of Present Illness    83 year old female with the above past medical history including infrarenal AAA s/p open repair in 08/2019, coronary artery calcification, mild mitral valve regurgitation, TR, hypertension, hyperlipidemia, osteoporosis, and former tobacco use.  She has a history of infrarenal AAA, previously measuring 5.7 cm, s/p open repair on 08/28/2019. Most recent AAA ultrasound in 10/2023 revealed patent abdominal aortobiiliac open repair without evidence of focal dilation, follow-up study for monitoring was recommended in 12 months.  Additionally, she has a history of coronary artery calcification noted on prior CT.  MPI in 2021 was negative for ischemia, EF 55 to 60%.  Echocardiogram in 07/2019 showed EF 55 to 60%, normal LV function, mild LVH, normal RV systolic function, mild mitral valve regurgitation, mild to moderate tricuspid valve regurgitation. She was last seen in the office on 11/12/2018 for and was doing well.  She denies symptoms concerning for angina.  She presents today for follow-up accompanied by her daughter.  Since her last visit he has been stable from a cardiac standpoint.  She notes occasional mild dyspnea on exertion, unchanged from prior visits.  She denies any chest pain, palpitations, dizziness, edema, PND, orthopnea, weight gain.  Overall, she reports feeling well.  Home Medications    Current Outpatient Medications  Medication Sig Dispense Refill   acetaminophen  (TYLENOL ) 500 MG  tablet Take 500 mg by mouth every 6 (six) hours as needed (ARTHRITIS PAIN.).     Alpha Lipoic Acid 200 MG CAPS See admin instructions.     aspirin  EC 81 MG tablet Take 1 tablet (81 mg total) by mouth daily.     BENFOTIAMINE PO Take 300 mg by mouth daily.     CALCIUM   CITRATE PO Take 1-2 tablets by mouth at bedtime.     Cholecalciferol (VITAMIN D3) 10 MCG (400 UNIT) tablet Take 400 Units by mouth daily.     denosumab  (PROLIA ) 60 MG/ML SOSY injection Inject 60 mg into the skin every 6 (six) months.     estradiol (ESTRACE) 0.1 MG/GM vaginal cream Place 1 Applicatorful vaginally 2 (two) times a week.      gabapentin (NEURONTIN) 300 MG capsule Take 300 mg by mouth at bedtime.     ibuprofen (ADVIL) 200 MG tablet 2 tabs     Magnesium  250 MG TABS Take 250 mg by mouth daily.      metoprolol  succinate (TOPROL -XL) 25 MG 24 hr tablet TAKE 1 TABLET(25 MG) BY MOUTH DAILY 90 tablet 2   Multiple Vitamin (MULTIVITAMIN WITH MINERALS) TABS tablet Take 1 tablet by mouth daily.     polyethylene glycol (MIRALAX / GLYCOLAX) 17 g packet 1/2 packet mixed with 8 ounces of fluid     rosuvastatin  (CRESTOR ) 10 MG tablet TAKE 1 TABLET(10 MG) BY MOUTH DAILY 90 tablet 1   Current Facility-Administered Medications  Medication Dose Route Frequency Provider Last Rate Last Admin   [START ON 11/21/2023] denosumab  (PROLIA ) injection 60 mg  60 mg Subcutaneous Once Gherghe, Cristina, MD         Review of Systems   She denies chest pain, palpitations, pnd, orthopnea, n, v, dizziness, syncope, edema, weight gain, or early satiety. All other systems reviewed and are otherwise negative except as noted above.   Physical Exam    VS:  BP 138/82 (BP Location: Right Arm, Patient Position: Sitting, Cuff Size: Normal)   Pulse 91   Ht 4\' 10"  (1.473 m)   Wt 128 lb (58.1 kg)   LMP  (LMP Unknown)   SpO2 96%   BMI 26.75 kg/m   GEN: Well nourished, well developed, in no acute distress. HEENT: normal. Neck: Supple, no JVD, carotid bruits, or masses. Cardiac: RRR, no murmurs, rubs, or gallops. No clubbing, cyanosis, edema.  Radials/DP/PT 2+ and equal bilaterally.  Respiratory:  Respirations regular and unlabored, clear to auscultation bilaterally. GI: Soft, nontender, nondistended, BS + x 4. MS: no  deformity or atrophy. Skin: warm and dry, no rash. Neuro:  Strength and sensation are intact. Psych: Normal affect.  Accessory Clinical Findings    ECG personally reviewed by me today - EKG Interpretation Date/Time:  Monday Nov 14 2023 15:34:29 EDT Ventricular Rate:  96 PR Interval:  178 QRS Duration:  78 QT Interval:  356 QTC Calculation: 449 R Axis:   45  Text Interpretation: Normal sinus rhythm Possible Anterior infarct , age undetermined When compared with ECG of 28-Aug-2019 12:54, Nonspecific T wave abnormality no longer evident in Anterior leads Confirmed by Marlana Silvan ([id number]) on 11/14/2023 3:36:35 PM  - no acute changes.   Lab Results  Component Value Date   WBC 6.0 10/27/2022   HGB 13.2 10/27/2022   HCT 39.5 10/27/2022   MCV 95.0 10/27/2022   PLT 163.0 10/27/2022   Lab Results  Component Value Date   CREATININE 0.64 10/27/2022   BUN 23 10/27/2022  NA 140 10/27/2022   K 4.6 10/27/2022   CL 102 10/27/2022   CO2 30 10/27/2022   Lab Results  Component Value Date   ALT 21 10/27/2022   AST 33 10/27/2022   ALKPHOS 42 10/27/2022   BILITOT 0.5 10/27/2022   Lab Results  Component Value Date   CHOL 134 10/27/2022   HDL 62.50 10/27/2022   LDLCALC 60 10/27/2022   TRIG 56.0 10/27/2022   CHOLHDL 2 10/27/2022    No results found for: "HGBA1C"  Assessment & Plan   1. History of AAA: S/p open repair in 08/2019. Most recent AAA ultrasound in 10/2023 revealed patent abdominal aortobiiliac open repair without evidence of focal dilation, follow-up study for monitoring was recommended in 12 months.  Will plan for repeat ultrasound in 10/2024.  2. Coronary artery calcification:  History of coronary artery calcification noted on prior CT.  MPI in 2021 was negative for ischemia, EF 55 to 60%. Stable with no anginal symptoms. No indication for ischemic evaluation.  Continue aspirin , metoprolol , Crestor .  3. Valvular heart disease:  Echocardiogram in 07/2019 showed EF 55 to 60%,  normal LV function, mild LVH, normal RV systolic function, mild mitral valve regurgitation, mild to moderate tricuspid valve regurgitation. She notes very mild dyspnea on exertion, unchanged from prior visits.  Generally euvolemic, well compensated on exam.  Through shared decision making, will repeat echo for routine monitoring of valvular heart disease.   4. Hypertension: BP well controlled. Continue current antihypertensive regimen.   5. Hyperlipidemia: LDL was 60 in 10/2022.  She is pending repeat fasting lipids with her PCP later this month.  Continue Crestor .  6. Disposition: Follow-up in 1 year, sooner if needed.      Jude Norton, NP 11/14/2023, 3:40 PM

## 2023-11-14 NOTE — Patient Instructions (Addendum)
 Medication Instructions:  Your physician recommends that you continue on your current medications as directed. Please refer to the Current Medication list given to you today.  *If you need a refill on your cardiac medications before your next appointment, please call your pharmacy*  Lab Work: NONE ordered at this time of appointment   Testing/Procedures: Your physician has requested that you have an echocardiogram. Echocardiography is a painless test that uses sound waves to create images of your heart. It provides your doctor with information about the size and shape of your heart and how well your heart's chambers and valves are working. This procedure takes approximately one hour. There are no restrictions for this procedure. Please do NOT wear cologne, perfume, aftershave, or lotions (deodorant is allowed). Please arrive 15 minutes prior to your appointment time.  Please note: We ask at that you not bring children with you during ultrasound (echo/ vascular) testing. Due to room size and safety concerns, children are not allowed in the ultrasound rooms during exams. Our front office staff cannot provide observation of children in our lobby area while testing is being conducted. An adult accompanying a patient to their appointment will only be allowed in the ultrasound room at the discretion of the ultrasound technician under special circumstances. We apologize for any inconvenience.  VAS ultrasound AAA Duplex April 2026.    Follow-Up: At [facility], you and your health needs are our priority.  As part of our continuing mission to provide you with exceptional heart care, our providers are all part of one team.  This team includes your primary Cardiologist (physician) and Advanced Practice Providers or APPs (Physician Assistants and Nurse Practitioners) who all work together to provide you with the care you need, when you need it.  Your next appointment:   1 year(s)  Provider:    Oneil Bigness, MD    We recommend signing up for the patient portal called "MyChart".  Sign up information is provided on this After Visit Summary.  MyChart is used to connect with patients for Virtual Visits (Telemedicine).  Patients are able to view lab/test results, encounter notes, upcoming appointments, etc.  Non-urgent messages can be sent to your provider as well.   To learn more about what you can do with MyChart, go to [email or web address].   Other Instructions

## 2023-11-15 DIAGNOSIS — M48061 Spinal stenosis, lumbar region without neurogenic claudication: Secondary | ICD-10-CM | POA: Diagnosis not present

## 2023-11-17 DIAGNOSIS — M48061 Spinal stenosis, lumbar region without neurogenic claudication: Secondary | ICD-10-CM | POA: Diagnosis not present

## 2023-11-22 ENCOUNTER — Ambulatory Visit (INDEPENDENT_AMBULATORY_CARE_PROVIDER_SITE_OTHER): Payer: Medicare Other

## 2023-11-22 VITALS — BP 124/70 | HR 73 | Ht <= 58 in | Wt 131.0 lb

## 2023-11-22 DIAGNOSIS — M48061 Spinal stenosis, lumbar region without neurogenic claudication: Secondary | ICD-10-CM | POA: Diagnosis not present

## 2023-11-22 DIAGNOSIS — M810 Age-related osteoporosis without current pathological fracture: Secondary | ICD-10-CM | POA: Diagnosis not present

## 2023-11-22 MED ORDER — DENOSUMAB 60 MG/ML ~~LOC~~ SOSY
60.0000 mg | PREFILLED_SYRINGE | SUBCUTANEOUS | Status: AC
  Administered 2024-05-25: 60 mg via SUBCUTANEOUS

## 2023-11-22 NOTE — Progress Notes (Signed)
After obtaining consent, and per orders of Dr. Gherghe, injection of Prolia 60 mg  given by Karolyna Bianchini L Clifton Kovacic in left arm SQ. Patient instructed to remain in clinic for 20 minutes afterwards, and to report any adverse reaction to me immediately.  

## 2023-11-23 NOTE — Telephone Encounter (Signed)
 Last Prolia inj 11/22/23 Next Prolia inj due 05/25/24

## 2023-11-24 ENCOUNTER — Ambulatory Visit: Payer: Self-pay | Admitting: Family Medicine

## 2023-11-24 ENCOUNTER — Ambulatory Visit (INDEPENDENT_AMBULATORY_CARE_PROVIDER_SITE_OTHER): Payer: Medicare Other | Admitting: Family Medicine

## 2023-11-24 ENCOUNTER — Ambulatory Visit: Payer: Medicare Other

## 2023-11-24 ENCOUNTER — Encounter: Payer: Self-pay | Admitting: Family Medicine

## 2023-11-24 VITALS — BP 138/78 | HR 85 | Temp 98.4°F | Ht <= 58 in | Wt 130.4 lb

## 2023-11-24 VITALS — BP 138/78 | HR 85 | Temp 98.4°F | Ht <= 58 in | Wt 130.0 lb

## 2023-11-24 DIAGNOSIS — J439 Emphysema, unspecified: Secondary | ICD-10-CM | POA: Diagnosis not present

## 2023-11-24 DIAGNOSIS — Z131 Encounter for screening for diabetes mellitus: Secondary | ICD-10-CM

## 2023-11-24 DIAGNOSIS — E785 Hyperlipidemia, unspecified: Secondary | ICD-10-CM

## 2023-11-24 DIAGNOSIS — I2510 Atherosclerotic heart disease of native coronary artery without angina pectoris: Secondary | ICD-10-CM

## 2023-11-24 DIAGNOSIS — D169 Benign neoplasm of bone and articular cartilage, unspecified: Secondary | ICD-10-CM

## 2023-11-24 DIAGNOSIS — I714 Abdominal aortic aneurysm, without rupture, unspecified: Secondary | ICD-10-CM

## 2023-11-24 DIAGNOSIS — I10 Essential (primary) hypertension: Secondary | ICD-10-CM | POA: Diagnosis not present

## 2023-11-24 DIAGNOSIS — E663 Overweight: Secondary | ICD-10-CM | POA: Diagnosis not present

## 2023-11-24 DIAGNOSIS — I700 Atherosclerosis of aorta: Secondary | ICD-10-CM

## 2023-11-24 DIAGNOSIS — K439 Ventral hernia without obstruction or gangrene: Secondary | ICD-10-CM | POA: Diagnosis not present

## 2023-11-24 DIAGNOSIS — Z0000 Encounter for general adult medical examination without abnormal findings: Secondary | ICD-10-CM

## 2023-11-24 DIAGNOSIS — Z789 Other specified health status: Secondary | ICD-10-CM

## 2023-11-24 DIAGNOSIS — Z23 Encounter for immunization: Secondary | ICD-10-CM | POA: Diagnosis not present

## 2023-11-24 LAB — CBC WITH DIFFERENTIAL/PLATELET
Basophils Absolute: 0 10*3/uL (ref 0.0–0.1)
Basophils Relative: 0.7 % (ref 0.0–3.0)
Eosinophils Absolute: 0.1 10*3/uL (ref 0.0–0.7)
Eosinophils Relative: 1.7 % (ref 0.0–5.0)
HCT: 39.8 % (ref 36.0–46.0)
Hemoglobin: 13.3 g/dL (ref 12.0–15.0)
Lymphocytes Relative: 22.2 % (ref 12.0–46.0)
Lymphs Abs: 1 10*3/uL (ref 0.7–4.0)
MCHC: 33.4 g/dL (ref 30.0–36.0)
MCV: 94.1 fl (ref 78.0–100.0)
Monocytes Absolute: 0.4 10*3/uL (ref 0.1–1.0)
Monocytes Relative: 9.5 % (ref 3.0–12.0)
Neutro Abs: 3.1 10*3/uL (ref 1.4–7.7)
Neutrophils Relative %: 65.9 % (ref 43.0–77.0)
Platelets: 137 10*3/uL — ABNORMAL LOW (ref 150.0–400.0)
RBC: 4.23 Mil/uL (ref 3.87–5.11)
RDW: 13.4 % (ref 11.5–15.5)
WBC: 4.7 10*3/uL (ref 4.0–10.5)

## 2023-11-24 LAB — LIPID PANEL
Cholesterol: 123 mg/dL (ref 0–200)
HDL: 62.4 mg/dL (ref >39.00)
LDL Cholesterol: 48 mg/dL (ref 0–99)
NonHDL: 60.88
Total CHOL/HDL Ratio: 2
Triglycerides: 63 mg/dL (ref 0.0–149.0)
VLDL: 12.6 mg/dL (ref 0.0–40.0)

## 2023-11-24 LAB — COMPREHENSIVE METABOLIC PANEL WITH GFR
ALT: 19 U/L (ref 0–35)
AST: 28 U/L (ref 0–37)
Albumin: 4.2 g/dL (ref 3.5–5.2)
Alkaline Phosphatase: 38 U/L — ABNORMAL LOW (ref 39–117)
BUN: 35 mg/dL — ABNORMAL HIGH (ref 6–23)
CO2: 30 meq/L (ref 19–32)
Calcium: 9.2 mg/dL (ref 8.4–10.5)
Chloride: 102 meq/L (ref 96–112)
Creatinine, Ser: 0.61 mg/dL (ref 0.40–1.20)
GFR: 81.58 mL/min (ref >60.00)
Glucose, Bld: 100 mg/dL — ABNORMAL HIGH (ref 70–99)
Potassium: 4.2 meq/L (ref 3.5–5.1)
Sodium: 137 meq/L (ref 135–145)
Total Bilirubin: 0.6 mg/dL (ref 0.2–1.2)
Total Protein: 6.7 g/dL (ref 6.0–8.3)

## 2023-11-24 LAB — HEMOGLOBIN A1C: Hgb A1c MFr Bld: 5.5 % (ref 4.6–6.5)

## 2023-11-24 NOTE — Patient Instructions (Signed)
 Abigail Cohen , Thank you for taking time out of your busy schedule to complete your Annual Wellness Visit with me. I enjoyed our conversation and look forward to speaking with you again next year. I, as well as your care team,  appreciate your ongoing commitment to your health goals. Please review the following plan we discussed and let me know if I can assist you in the future. Your Game plan/ To Do List    Referrals: If you haven't heard from the office you've been referred to, please reach out to them at the phone provided.   Follow up Visits: Next Medicare AWV with our clinical staff: 12/03/24   Have you seen your provider in the last 6 months (3 months if uncontrolled diabetes)? No Next Office Visit with your provider: 11/24/23  Clinician Recommendations:  Aim for 30 minutes of exercise or brisk walking, 6-8 glasses of water, and 5 servings of fruits and vegetables each day.        This is a list of the screening recommended for you and due dates:  Health Maintenance  Topic Date Due   Medicare Annual Wellness Visit  06/01/2023   COVID-19 Vaccine (8 - 2024-25 season) 11/04/2023   Flu Shot  02/03/2024   DTaP/Tdap/Td vaccine (3 - Td or Tdap) 05/23/2031   Pneumonia Vaccine  Completed   DEXA scan (bone density measurement)  Completed   Zoster (Shingles) Vaccine  Completed   HPV Vaccine  Aged Out   Meningitis B Vaccine  Aged Out    Advanced directives: (Copy Requested) Please bring a copy of your health care power of attorney and living will to the office to be added to your chart at your convenience. You can mail to [facility] [address] [place], [place] [address] or email to [email or web address] Advance Care Planning is important because it:  [x]  Makes sure you receive the medical care that is consistent with your values, goals, and preferences  [x]  It provides guidance to your family and loved ones and reduces their decisional burden about whether or not  they are making the right decisions based on your wishes.  Follow the link provided in your after visit summary or read over the paperwork we have mailed to you to help you started getting your Advance Directives in place. If you need assistance in completing these, please reach out to us  so that we can help you!  See attachments for Preventive Care and Fall Prevention Tips.

## 2023-11-24 NOTE — Addendum Note (Signed)
 Addended by: Almira Jaeger on: 11/24/2023 05:30 PM   Modules accepted: Level of Service

## 2023-11-24 NOTE — Patient Instructions (Addendum)
 We have placed a referral for you today to [facility]- please call their # if you do not hear within a week  [phone]  We have placed a referral for you today to ENT- please call their # if you do not hear within a week (may be listed below or you may see mychart message within a few days with #).   Please stop by lab before you go If you have mychart- we will send your results within 3 business days of us  receiving them.  If you do not have mychart- we will call you about results within 5 business days of us  receiving them.  *please also note that you will see labs on mychart as soon as they post. I will later go in and write notes on them- will say "notes from Dr. Arlene Ben"   Recommended follow up: Return in about 6 months (around 05/26/2024) for followup or sooner if needed.Schedule b4 you leave.

## 2023-11-24 NOTE — Progress Notes (Signed)
 Subjective:   Abigail Cohen is a 83 y.o. who presents for a Medicare Wellness preventive visit.  As a reminder, Annual Wellness Visits don't include a physical exam, and some assessments may be limited, especially if this visit is performed virtually. We may recommend an in-person follow-up visit with your provider if needed.  Visit Complete: In person    Persons Participating in Visit: Patient.  AWV Questionnaire: Yes: Patient Medicare AWV questionnaire was completed by the patient on 11/21/23; I have confirmed that all information answered by patient is correct and no changes since this date.  Cardiac Risk Factors include: advanced age (>58men, >27 women);dyslipidemia;hypertension     Objective:     Today's Vitals   11/24/23 1047  BP: 138/78  Pulse: 85  Temp: 98.4 F (36.9 C)  SpO2: 95%  Weight: 130 lb 6.4 oz (59.1 kg)  Height: 4\' 10"  (1.473 m)   Body mass index is 27.25 kg/m.     11/24/2023   10:56 AM 05/31/2022    1:12 PM 07/01/2021   10:33 AM 11/21/2019    1:10 PM 08/31/2019    7:00 AM 08/27/2019    9:23 AM 07/18/2019    8:56 AM  Advanced Directives  Does Patient Have a Medical Advance Directive? Yes Yes No Yes Yes Yes Yes  Type of Estate agent of [place];Living will Healthcare Power of [place];Living will  Healthcare Power of [place];Living will Healthcare Power of [place];Living will Healthcare Power of [place];Living will Healthcare Power of [place];Living will  Does patient want to make changes to medical advance directive?    No - Patient declined No - Patient declined  No - Patient declined  Copy of Healthcare Power of Attorney in Chart? No - copy requested No - copy requested    No - copy requested   Would patient like information on creating a medical advance directive?   No - Patient declined        Current Medications (verified) Outpatient Encounter Medications as of 11/24/2023  Medication Sig   acetaminophen  (TYLENOL ) 500 MG  tablet Take 500 mg by mouth every 6 (six) hours as needed (ARTHRITIS PAIN.).   Alpha Lipoic Acid 200 MG CAPS See admin instructions.   aspirin  EC 81 MG tablet Take 1 tablet (81 mg total) by mouth daily.   BENFOTIAMINE PO Take 300 mg by mouth daily.   CALCIUM  CITRATE PO Take 1-2 tablets by mouth at bedtime.   Cholecalciferol (VITAMIN D3) 10 MCG (400 UNIT) tablet Take 400 Units by mouth daily.   denosumab  (PROLIA ) 60 MG/ML SOSY injection Inject 60 mg into the skin every 6 (six) months.   estradiol (ESTRACE) 0.1 MG/GM vaginal cream Place 1 Applicatorful vaginally 2 (two) times a week.    gabapentin (NEURONTIN) 300 MG capsule Take 300 mg by mouth at bedtime.   ibuprofen (ADVIL) 200 MG tablet 2 tabs   Magnesium  250 MG TABS Take 250 mg by mouth daily.    metoprolol  succinate (TOPROL -XL) 25 MG 24 hr tablet TAKE 1 TABLET(25 MG) BY MOUTH DAILY   Multiple Vitamin (MULTIVITAMIN WITH MINERALS) TABS tablet Take 1 tablet by mouth daily.   polyethylene glycol (MIRALAX / GLYCOLAX) 17 g packet 1/2 packet mixed with 8 ounces of fluid   rosuvastatin  (CRESTOR ) 10 MG tablet TAKE 1 TABLET(10 MG) BY MOUTH DAILY   Facility-Administered Encounter Medications as of 11/24/2023  Medication   [START ON 05/20/2024] denosumab  (PROLIA ) injection 60 mg    Allergies (verified) Penicillins   History:  Past Medical History:  Diagnosis Date   AAA (abdominal aortic aneurysm) (HCC)    Allergy    Essential hypertension h   Family history of anesthesia complication    daughter gets nauseated   Hearing loss    wears hearing aids   Hypertension    Knee joint replacement status    Osteoarthritis    Osteopenia    Osteoporosis    Presence of intracervical pessary    Uterine prolapse    Wears glasses    Wears partial dentures    upper and lower partial   Past Surgical History:  Procedure Laterality Date   ABDOMINAL AORTIC ANEURYSM REPAIR N/A 08/28/2019   Procedure: ANEURYSM ABDOMINAL AORTIC REPAIR OPEN;  Surgeon:  Dannis Dy, MD;  Location: [facility] OR;  Service: Vascular;  Laterality: N/A;   COLONOSCOPY     EYE SURGERY     cataracts removal   HERNIA REPAIR  07/07/12   Dr Vera Gip HERNIA REPAIR  07/07/2012   Procedure: HERNIA REPAIR INGUINAL ADULT;  Surgeon: Darcella Earnest, MD;  Location: [facility];  Service: General;  Laterality: Left;  left inguinal area   REPLACEMENT TOTAL KNEE BILATERAL  2003   TONSILLECTOMY  1945   WISDOM TOOTH EXTRACTION     Family History  Problem Relation Age of Onset   Heart disease Mother    Heart attack Mother        28s   Arrhythmia Mother    Heart disease Father        14s   Heart attack Father    Cancer Sister        endometrial   Social History   Socioeconomic History   Marital status: Divorced    Spouse name: Not on file   Number of children: 2   Years of education: Not on file   Highest education level: Bachelor's degree (e.g., BA, AB, BS)  Occupational History   Occupation: retired Firefighter  Tobacco Use   Smoking status: Former    Current packs/day: 0.00    Types: Cigarettes    Start date: 07/05/2004    Quit date: 06/30/2005    Years since quitting: 18.4   Smokeless tobacco: Never  Vaping Use   Vaping status: Never Used  Substance and Sexual Activity   Alcohol use: Yes    Alcohol/week: 7.0 standard drinks of alcohol    Types: 7 Glasses of wine per week   Drug use: No   Sexual activity: Not Currently    Birth control/protection: Post-menopausal  Other Topics Concern   Not on file  Social History Narrative   Divorced- ex husband now deceased. Lives alone in townhome.    Daughter lives in town, son lives in town.       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      Hobbies: enjoys travel, Producer, television/film/video, music, dance, Surveyor, quantity    Social Drivers of Health   Financial Resource Strain: Low Risk  (11/21/2023)   Overall Financial Resource Strain (CARDIA)    Difficulty of Paying  Living Expenses: Not hard at all  Food Insecurity: No Food Insecurity (11/21/2023)   Hunger Vital Sign    Worried About Running Out of Food in the Last Year: Never true    Ran Out of Food in the Last Year: Never true  Transportation Needs: No Transportation Needs (11/21/2023)   PRAPARE - Administrator, Civil Service (Medical): No  Lack of Transportation (Non-Medical): No  Physical Activity: Sufficiently Active (11/21/2023)   Exercise Vital Sign    Days of Exercise per Week: 5 days    Minutes of Exercise per Session: 30 min  Stress: No Stress Concern Present (11/21/2023)   [employer] of Occupational Health - Occupational Stress Questionnaire    Feeling of Stress : Only a little  Social Connections: Unknown (11/21/2023)   Social Connection and Isolation Panel [NHANES]    Frequency of Communication with Friends and Family: More than three times a week    Frequency of Social Gatherings with Friends and Family: Twice a week    Attends Religious Services: Patient declined    Database administrator or Organizations: No    Attends Engineer, structural: Not on file    Marital Status: Divorced    Tobacco Counseling Counseling given: Not Answered    Clinical Intake:  Pre-visit preparation completed: Yes  Pain : No/denies pain     BMI - recorded: 27.25 Nutritional Status: BMI 25 -29 Overweight Nutritional Risks: None Diabetes: No  No results found for: "HGBA1C"   How often do you need to have someone help you when you read instructions, pamphlets, or other written materials from your doctor or pharmacy?: 1 - Never  Interpreter Needed?: No  Information entered by :: Lamont Pilsner, LPN   Activities of Daily Living     11/24/2023   10:49 AM  In your present state of health, do you have any difficulty performing the following activities:  Hearing? 1  Comment hearing aids  Vision? 0  Difficulty concentrating or making decisions? 0  Walking or  climbing stairs? 1  Comment use walker avoids stairs  Dressing or bathing? 0  Doing errands, shopping? 0  Preparing Food and eating ? N  Using the Toilet? N  In the past six months, have you accidently leaked urine? N  Do you have problems with loss of bowel control? N  Managing your Medications? N  Managing your Finances? N  Housekeeping or managing your Housekeeping? N    Patient Care Team: Almira Jaeger, MD as PCP - General (Family Medicine) O'Neal, Cathay Clonts, MD as PCP - Cardiology (Cardiology)  Indicate any recent Medical Services you may have received from other than [facility] providers in the past year (date may be approximate).     Assessment:    This is a routine wellness examination for [place].  Hearing/Vision screen Hearing Screening - Comments:: Hearing aids  Vision Screening - Comments:: Wears rx glasses - up to date with routine eye exams with Dr Roderick Civatte     Goals Addressed             This Visit's Progress    Patient Stated       Patient Stated       Maintain health and activity        Depression Screen     11/24/2023   10:53 AM 05/23/2023    1:33 PM 05/31/2022    1:09 PM 05/10/2022    1:02 PM 05/04/2021    1:33 PM 10/30/2020    1:22 PM  PHQ 2/9 Scores  PHQ - 2 Score 0 0 0 0 0 0  PHQ- 9 Score  0 0 0  0    Fall Risk     11/24/2023   10:57 AM 05/23/2023    1:33 PM 05/31/2022    1:13 PM 05/29/2022    3:29 AM 11/05/2021  2:01 PM  Fall Risk   Falls in the past year? 1 0 0 0 0  Number falls in past yr: 1 0 0  0  Injury with Fall? 0 0 0  0  Risk for fall due to : History of fall(s);Impaired mobility;Impaired balance/gait No Fall Risks Impaired balance/gait;Impaired vision  No Fall Risks  Follow up Falls prevention discussed Falls evaluation completed Falls prevention discussed  Falls evaluation completed    MEDICARE RISK AT HOME:  Medicare Risk at Home Any stairs in or around the home?: Yes If so, are there any without handrails?:  No Home free of loose throw rugs in walkways, pet beds, electrical cords, etc?: Yes Adequate lighting in your home to reduce risk of falls?: Yes Life alert?: No Use of a cane, walker or w/c?: Yes Grab bars in the bathroom?: Yes Shower chair or bench in shower?: Yes Elevated toilet seat or a handicapped toilet?: Yes  TIMED UP AND GO:  Was the test performed?  Yes  Length of time to ambulate 10 feet: 15 sec Gait slow and steady with assistive device  Cognitive Function: 6CIT completed        11/24/2023   10:58 AM 05/31/2022    1:14 PM  6CIT Screen  What Year? 0 points 0 points  What month? 0 points 0 points  What time? 0 points 0 points  Count back from 20 0 points 0 points  Months in reverse 0 points 0 points  Repeat phrase 0 points 0 points  Total Score 0 points 0 points    Immunizations Immunization History  Administered Date(s) Administered   Fluad Quad(high Dose 65+) 04/23/2021, 05/18/2022   Influenza Split 05/18/2007, 06/07/2012, 04/19/2019, 04/21/2020   Influenza, High Dose Seasonal PF 04/25/2013, 07/06/2014, 04/22/2015, 04/28/2016, 04/29/2017, 05/02/2018, 04/19/2019   Influenza-Unspecified 05/07/2023   PFIZER Comirnaty(Gray Top)Covid-19 Tri-Sucrose Vaccine 06/03/2022   PFIZER(Purple Top)SARS-COV-2 Vaccination 07/14/2019, 08/04/2019, 03/21/2020   Pfizer Covid-19 Vaccine Bivalent Booster 77yrs & up 04/23/2021, 12/23/2021   Pfizer(Comirnaty)Fall Seasonal Vaccine 12 years and older 05/07/2023   Pneumococcal Conjugate-13 04/22/2015   Pneumococcal Polysaccharide-23 08/02/2006   Tdap 02/03/2011, 05/22/2021   Zoster Recombinant(Shingrix) 05/08/2021, 07/14/2021   Zoster, Live 02/03/2011   Zoster, Unspecified 02/03/2011    Screening Tests Health Maintenance  Topic Date Due   COVID-19 Vaccine (8 - 2024-25 season) 11/04/2023   INFLUENZA VACCINE  02/03/2024   Medicare Annual Wellness (AWV)  11/23/2024   DTaP/Tdap/Td (3 - Td or Tdap) 05/23/2031   Pneumonia Vaccine 52+  Years old  Completed   DEXA SCAN  Completed   Zoster Vaccines- Shingrix  Completed   HPV VACCINES  Aged Out   Meningococcal B Vaccine  Aged Out    Health Maintenance  Health Maintenance Due  Topic Date Due   COVID-19 Vaccine (8 - 2024-25 season) 11/04/2023   Health Maintenance Items Addressed: See Nurse Notes  Additional Screening:  Vision Screening: Recommended annual ophthalmology exams for early detection of glaucoma and other disorders of the eye.  Dental Screening: Recommended annual dental exams for proper oral hygiene  Community Resource Referral / Chronic Care Management: CRR required this visit?  No   CCM required this visit?  No   Plan:    I have personally reviewed and noted the following in the patient's chart:   Medical and social history Use of alcohol, tobacco or illicit drugs  Current medications and supplements including opioid prescriptions. Patient is not currently taking opioid prescriptions. Functional ability and status Nutritional status Physical  activity Advanced directives List of other physicians Hospitalizations, surgeries, and ER visits in previous 12 months Vitals Screenings to include cognitive, depression, and falls Referrals and appointments  In addition, I have reviewed and discussed with patient certain preventive protocols, quality metrics, and best practice recommendations. A written personalized care plan for preventive services as well as general preventive health recommendations were provided to patient.   Bruno Capri, LPN   4/54/0981   After Visit Summary: (MyChart) Due to this being a telephonic visit, the after visit summary with patients personalized plan was offered to patient via MyChart   Notes: Nothing significant to report at this time.

## 2023-11-24 NOTE — Progress Notes (Signed)
 Phone [phone] In person visit   Subjective:   Abigail Cohen is a 83 y.o. year old very pleasant female patient who presents for/with See problem oriented charting Chief Complaint  Patient presents with   Medical Management of Chronic Issues   Hypertension   Hyperlipidemia   measles    Wants to check immunity.    lump in groin    Pt c/o lump in left groin area.   Constipation    Can not get right combo of miralax.    Past Medical History-  Patient Active Problem List   Diagnosis Date Noted   nonobstructive CAD- follows with Dr. Rolm Clos cardiology  05/04/2021    Priority: High   Abdominal aortic aneurysm (AAA) (HCC) 08/28/2019    Priority: High   Hyperlipidemia 10/30/2020    Priority: Medium    Essential hypertension 10/30/2020    Priority: Medium    Osteoarthritis 10/30/2020    Priority: Medium    Osteoporosis 08/02/2016    Priority: Medium    Aortic atherosclerosis (HCC) 11/05/2021    Priority: Low   Emphysema lung (HCC) 11/05/2021    Priority: Low   Diverticulosis 11/05/2021    Priority: Low   Allergic rhinitis 10/30/2020    Priority: Low   Left inguinal hernia 06/20/2012    Priority: Low   Primary localized osteoarthrosis of multiple sites 10/21/2022   Shoulder joint pain 10/21/2022   Spinal stenosis of lumbar region 10/21/2022    Medications- reviewed and updated Current Outpatient Medications  Medication Sig Dispense Refill   acetaminophen  (TYLENOL ) 500 MG tablet Take 500 mg by mouth every 6 (six) hours as needed (ARTHRITIS PAIN.).     Alpha Lipoic Acid 200 MG CAPS See admin instructions.     aspirin  EC 81 MG tablet Take 1 tablet (81 mg total) by mouth daily.     BENFOTIAMINE PO Take 300 mg by mouth daily.     CALCIUM  CITRATE PO Take 1-2 tablets by mouth at bedtime.     Cholecalciferol (VITAMIN D3) 10 MCG (400 UNIT) tablet Take 400 Units by mouth daily.     denosumab  (PROLIA ) 60 MG/ML SOSY injection Inject 60 mg into the skin every 6 (six) months.      estradiol (ESTRACE) 0.1 MG/GM vaginal cream Place 1 Applicatorful vaginally 2 (two) times a week.      gabapentin (NEURONTIN) 300 MG capsule Take 300 mg by mouth at bedtime.     ibuprofen (ADVIL) 200 MG tablet 2 tabs     Magnesium  250 MG TABS Take 250 mg by mouth daily.      metoprolol  succinate (TOPROL -XL) 25 MG 24 hr tablet TAKE 1 TABLET(25 MG) BY MOUTH DAILY 90 tablet 2   Multiple Vitamin (MULTIVITAMIN WITH MINERALS) TABS tablet Take 1 tablet by mouth daily.     polyethylene glycol (MIRALAX / GLYCOLAX) 17 g packet 1/2 packet mixed with 8 ounces of fluid     rosuvastatin  (CRESTOR ) 10 MG tablet TAKE 1 TABLET(10 MG) BY MOUTH DAILY 90 tablet 1   Current Facility-Administered Medications  Medication Dose Route Frequency Provider Last Rate Last Admin   [START ON 05/20/2024] denosumab  (PROLIA ) injection 60 mg  60 mg Subcutaneous Q6 months Emilie Harden, MD         Objective:  BP 138/78   Pulse 85   Temp 98.4 F (36.9 C)   Ht 4\' 10"  (1.473 m)   Wt 130 lb (59 kg)   LMP  (LMP Unknown)   SpO2 95%  BMI 27.17 kg/m  Gen: NAD, resting comfortably, short stature 1.3 to 1.5 cm bony enlargement beneath right eyebrow-previously diagnosed by CT as osteoma CV: RRR no murmurs rubs or gallops Lungs: CTAB no crackles, wheeze, rhonchi Abdomen: soft/nontender/nondistended/normal bowel sounds. No rebound or guarding.  Ext: no edema Skin: warm, dry Neuro: Walks with walker    Assessment and Plan   #Osteoma- right forehead - seems to be slightly larger than when initially checked 05/05/21- shed like to chat with ENT for their opinion- referred today  #constipation- trying to find balance with miralax 2/3 every day. Getting loose sometimes. May skip a day if gets loose . Gets constipation 3-4 days if does half capful - no blood in stool or melena or unintentional weight loss -may try half then 2/3 every other day  #Measles status unknown- will check status  #Left groin lump- slight  protrusion and does move. Around a month has noted it.  - reports prior hernia surgery in this area and had mesh- but this is definitely a change for her and will refer back to [facility] - prior Dr. Linell Rhymes- for their expert opinion   #Abdominal aortic aneurysm without rupture with history of open repair-follows with Dr. Rolm Clos of cardiology as well as vascular surgery S:Medication: Aspirin  and statin recommended per cardiology. She is on both -Stable April 2025 ultrasound with repeat planned in 1 year  A/P: thankfully stable- continue to monitor    #Coronary artery disease based on coronary artery calcification seen on CT chest with reassuring stress test and echocardiogram-follows with Dr. Lucy Sack of cardiology #hyperlipidemia- LDL goal under 70 S: Medication:Rosuvastatin  10 mg, aspirin  81mg  Lab Results  Component Value Date   CHOL 134 10/27/2022   HDL 62.50 10/27/2022   LDLCALC 60 10/27/2022   TRIG 56.0 10/27/2022   CHOLHDL 2 10/27/2022  A/P: hyperlipidemia - at goal last year- update today with labs CAD- no chest pain, stable shortness of breath- seems to be doing well- continue to monitor   #hypertension S: medication: Metoprolol  succinate 25 mg XR daily -#s even better at home A/P: reasonable control- continue current medications    # Osteoporosis- sees Dr. Ned Balint S: Last DEXA: 06/24/22  Medication (bisphosphonate or prolia ):  prolia  started 2018. Had been on fosamax 2015-2017 without clear benefit with plan for 1-2 year reclast at the end  Calcium : 1200mg  (through diet ok) recommended  Vitamin D : 1000 units a day recommended  A/P: suspect stable- continu follow up with Dr. Aldona Amel   #Osteoarthritis- follows with Dr. Meredith Stalls of rheumatology. Bilateral knee replacements, hammer toes, bunions, back, scoliosis. No regular rx  -dental prophylaxis from dentist- started out with orthopedist  # former smoker- quit in 2006- 35 years at least. Get UA with yearly labs - wants to do next  visit  #Pessary through ob/gyn and also on estrace cream   # neuropathy- on alpha lipoic acid through Dr. Aldona Amel originally patient reports. also sees podiatry and has hammer toes . some balance issues- does use a cane or walker -gradually progressive- continue to monitor   #emphysema noted on CT abd/pelvis on lower portion of lungs in 2020- stable shortness of breath- not a major issue  #Spinal stenosis- ultimately diagnosed with spinal stenosis- not best surgical candidate- 2023 accupuncture somewhat helpful Mu accupuncture and chinese herbs in 2023-referred by [facility] Dr. Margery Sheets- hasn't needed lately but considering   Recommended follow up: Return in about 6 months (around 05/26/2024) for followup or sooner if needed.Schedule b4 you leave. Future  Appointments  Date Time Provider Department Center  12/13/2023  1:15 PM [place], [place], [place] [facility]  12/19/2023  2:50 PM [facility] 2 [facility]  05/25/2024  1:30 PM [facility] NURSE [facility] None  08/21/2024  1:00 PM Emilie Harden, MD [facility] None  10/03/2024  8:00 AM [facility] 4 [facility] H&V  12/03/2024 10:40 AM [facility] ANNUAL WELLNESS VISIT 1 [facility]   Lab/Order associations:   ICD-10-CM   1. Essential hypertension  I10     2. Hyperlipidemia, unspecified hyperlipidemia type  E78.5 Comprehensive metabolic panel with GFR    CBC with Differential/Platelet    Lipid panel    3. Aortic atherosclerosis (HCC)  I70.0     4. Pulmonary emphysema, unspecified emphysema type (HCC)  J43.9     5. Abdominal aortic aneurysm (AAA) without rupture, unspecified part (HCC)  I71.40     6. Coronary artery disease involving native coronary artery of native heart without angina pectoris  I25.10     7. Screening for diabetes mellitus  Z13.1 Hemoglobin A1c    8. Overweight  E66.3 Hemoglobin A1c    9. Measles, mumps, rubella (MMR) vaccination status unknown  Z78.9 Measles/Mumps/Rubella Immunity    10. Abdominal wall  hernia  K43.9 Ambulatory referral to General Surgery    11. Osteoma  D16.9 Ambulatory referral to ENT      No orders of the defined types were placed in this encounter.   Return precautions advised.  Clarisa Crooked, MD

## 2023-11-26 LAB — MEASLES/MUMPS/RUBELLA IMMUNITY
Mumps IgG: 56.7 [AU]/ml
Rubella: 29.2 {index}
Rubeola IgG: <13.5 [AU]/ml — ABNORMAL LOW

## 2023-11-29 DIAGNOSIS — M48061 Spinal stenosis, lumbar region without neurogenic claudication: Secondary | ICD-10-CM | POA: Diagnosis not present

## 2023-12-01 DIAGNOSIS — M48061 Spinal stenosis, lumbar region without neurogenic claudication: Secondary | ICD-10-CM | POA: Diagnosis not present

## 2023-12-07 DIAGNOSIS — M48061 Spinal stenosis, lumbar region without neurogenic claudication: Secondary | ICD-10-CM | POA: Diagnosis not present

## 2023-12-08 DIAGNOSIS — K4091 Unilateral inguinal hernia, without obstruction or gangrene, recurrent: Secondary | ICD-10-CM | POA: Diagnosis not present

## 2023-12-12 ENCOUNTER — Encounter: Payer: Self-pay | Admitting: Family Medicine

## 2023-12-12 DIAGNOSIS — M48061 Spinal stenosis, lumbar region without neurogenic claudication: Secondary | ICD-10-CM | POA: Diagnosis not present

## 2023-12-13 ENCOUNTER — Ambulatory Visit (INDEPENDENT_AMBULATORY_CARE_PROVIDER_SITE_OTHER): Admitting: Podiatry

## 2023-12-13 ENCOUNTER — Encounter: Payer: Self-pay | Admitting: Podiatry

## 2023-12-13 DIAGNOSIS — B351 Tinea unguium: Secondary | ICD-10-CM

## 2023-12-13 DIAGNOSIS — M79676 Pain in unspecified toe(s): Secondary | ICD-10-CM | POA: Diagnosis not present

## 2023-12-13 NOTE — Progress Notes (Signed)
 She presents today chief complaint of painful elongated toenails. Also relates some sharp pain occassionaly. Has Sciatic and is in PT for balance training.  Objective: Pulses are palpable.  There is no erythema edema cellulitis drainage or odor.  Nails are long thick yellow dystrophic onychomycotic.No reproducible pain.  Assessment: Pain limb secondary to onychomycosis.Sciatica  Plan: Debridement of toenails 1 through 5 bilateral.

## 2023-12-18 ENCOUNTER — Other Ambulatory Visit: Payer: Self-pay | Admitting: Cardiovascular Disease

## 2023-12-19 ENCOUNTER — Ambulatory Visit (HOSPITAL_COMMUNITY)
Admission: RE | Admit: 2023-12-19 | Discharge: 2023-12-19 | Disposition: A | Discharge status: Home / Self Care | Source: Ambulatory Visit | Attending: Cardiology | Admitting: Cardiology

## 2023-12-19 DIAGNOSIS — I340 Nonrheumatic mitral (valve) insufficiency: Secondary | ICD-10-CM | POA: Diagnosis not present

## 2023-12-19 LAB — ECHOCARDIOGRAM COMPLETE
Area-P 1/2: 2.95 cm2
MV M vel: 4.95 m/s
MV Peak grad: 98 mmHg
Radius: 0.4 cm
S' Lateral: 2.2 cm

## 2023-12-20 DIAGNOSIS — M48061 Spinal stenosis, lumbar region without neurogenic claudication: Secondary | ICD-10-CM | POA: Diagnosis not present

## 2023-12-22 ENCOUNTER — Ambulatory Visit: Payer: Self-pay | Admitting: Nurse Practitioner

## 2023-12-22 DIAGNOSIS — M48061 Spinal stenosis, lumbar region without neurogenic claudication: Secondary | ICD-10-CM | POA: Diagnosis not present

## 2023-12-23 NOTE — Telephone Encounter (Signed)
 Follow Up:     Patient is returning a call from today.

## 2023-12-23 NOTE — Progress Notes (Signed)
 Left voice mail for patient to call back for result

## 2023-12-29 DIAGNOSIS — M48061 Spinal stenosis, lumbar region without neurogenic claudication: Secondary | ICD-10-CM | POA: Diagnosis not present

## 2024-01-24 ENCOUNTER — Institutional Professional Consult (permissible substitution) (INDEPENDENT_AMBULATORY_CARE_PROVIDER_SITE_OTHER): Admitting: Otolaryngology

## 2024-01-24 VITALS — BP 130/80 | HR 76

## 2024-01-24 DIAGNOSIS — H6123 Impacted cerumen, bilateral: Secondary | ICD-10-CM

## 2024-01-24 DIAGNOSIS — D164 Benign neoplasm of bones of skull and face: Secondary | ICD-10-CM | POA: Diagnosis not present

## 2024-01-25 DIAGNOSIS — H6123 Impacted cerumen, bilateral: Secondary | ICD-10-CM | POA: Insufficient documentation

## 2024-01-25 DIAGNOSIS — D164 Benign neoplasm of bones of skull and face: Secondary | ICD-10-CM | POA: Insufficient documentation

## 2024-01-25 NOTE — Progress Notes (Signed)
 CC: Right supraorbital osteoma  HPI:  Abigail Cohen is a 83 y.o. 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  The patient first noted a right supraorbital bump 4 to 5 years ago.  According to the patient, it has gradually enlarged in size.  A CT scan in 2022 showed a 1 cm osteoma on her right superior orbital rim.  The area is not tender to touch.  The patient is not symptomatic.  She denies any facial pain or visual change.  Past Medical History:  Diagnosis Date   AAA (abdominal aortic aneurysm) (HCC)    Allergy    Cataract    Essential hypertension h   Family history of anesthesia complication    daughter gets nauseated   Hearing loss    wears hearing aids   Hypertension    Knee joint replacement status    Osteoarthritis    Osteopenia    Osteoporosis    Presence of intracervical pessary    Uterine prolapse    Wears glasses    Wears partial dentures    upper and lower partial    Past Surgical History:  Procedure Laterality Date   ABDOMINAL AORTIC ANEURYSM REPAIR N/A 08/28/2019   Procedure: ANEURYSM ABDOMINAL AORTIC REPAIR OPEN;  Surgeon: Eliza Lonni RAMAN, MD;  Location: [facility] OR;  Service: Vascular;  Laterality: N/A;   COLONOSCOPY     EYE SURGERY     cataracts removal   HERNIA REPAIR  07/07/2012   Dr Merrilyn FONTANA HERNIA REPAIR  07/07/2012   Procedure: HERNIA REPAIR INGUINAL ADULT;  Surgeon: Sherlean JINNY Merrilyn, MD;  Location: [facility];  Service: General;  Laterality: Left;  left inguinal area   JOINT REPLACEMENT     REPLACEMENT TOTAL KNEE BILATERAL  2003   TONSILLECTOMY  1945   WISDOM TOOTH EXTRACTION      Family History  Problem Relation Age of Onset   Heart disease Mother    Heart attack Mother        75s   Arrhythmia Mother    Arthritis Mother    Heart disease Father        33s   Heart attack Father    Cancer Sister        endometrial   Cancer Maternal Grandmother    ADD / ADHD Daughter    Asthma  Daughter     Social History:  reports that she quit smoking about 18 years ago. Her smoking use included cigarettes. She started smoking about 19 years ago. She has never used smokeless tobacco. She reports current alcohol use of about 7.0 standard drinks of alcohol per week. She reports that she does not use drugs.  Allergies:  Allergies  Allergen Reactions   Penicillins     Blood shot eyes, felt poorly.  Tolerates amoxicillin with dentist    Prior to Admission medications   Medication Sig Start Date End Date Taking? Authorizing Provider  acetaminophen  (TYLENOL ) 500 MG tablet Take 500 mg by mouth every 6 (six) hours as needed (ARTHRITIS PAIN.).   Yes [provider]  Alpha Lipoic Acid 200 MG CAPS See admin instructions.   Yes [provider]  aspirin  EC 81 MG tablet Take 1 tablet (81 mg total) by mouth daily. 07/12/19  Yes O'Neal, Darryle Ned, MD  BENFOTIAMINE PO Take 300 mg by mouth daily.   Yes [provider]  CALCIUM  CITRATE PO Take 1-2 tablets by mouth at bedtime.  Yes [provider]  Cholecalciferol (VITAMIN D3) 10 MCG (400 UNIT) tablet Take 400 Units by mouth daily.   Yes [provider]  denosumab  (PROLIA ) 60 MG/ML SOSY injection Inject 60 mg into the skin every 6 (six) months.   Yes [provider]  estradiol (ESTRACE) 0.1 MG/GM vaginal cream Place 1 Applicatorful vaginally 2 (two) times a week.    Yes [provider]  gabapentin (NEURONTIN) 300 MG capsule Take 300 mg by mouth at bedtime. 10/13/21  Yes [provider]  ibuprofen (ADVIL) 200 MG tablet 2 tabs   Yes [provider]  Magnesium  250 MG TABS Take 250 mg by mouth daily.    Yes [provider]  metoprolol  succinate (TOPROL -XL) 25 MG 24 hr tablet TAKE 1 TABLET(25 MG) BY MOUTH DAILY 11/10/23  Yes O'Neal, Darryle Ned, MD  Multiple Vitamin (MULTIVITAMIN WITH MINERALS) TABS tablet Take 1 tablet by mouth daily.   Yes [provider]  polyethylene glycol (MIRALAX / GLYCOLAX) 17 g packet 1/2 packet mixed with 8 ounces of fluid   Yes [provider]  rosuvastatin  (CRESTOR ) 10 MG tablet TAKE 1 TABLET(10 MG) BY MOUTH DAILY 12/20/23  Yes O'Neal, Darryle Ned, MD    Blood pressure 130/80, pulse 76, SpO2 94%. Exam: General: Communicates without difficulty, well nourished, no acute distress. Head: Normocephalic, no evidence injury, no tenderness, facial buttresses intact without stepoff. Face/sinus: No tenderness to palpation and percussion. Facial movement is normal and symmetric.  A 1 cm bony protuberance is noted on her right supraorbital rim.  Eyes: PERRL, EOMI. No scleral icterus, conjunctivae clear. Neuro: CN II exam reveals vision grossly intact.  No nystagmus at any point of gaze. Ears: Auricles well formed without lesions.  Bilateral cerumen impaction.  Nose: External evaluation reveals normal support and skin without lesions.  Dorsum is intact.  Anterior rhinoscopy reveals normal mucosa over anterior aspect of inferior turbinates and intact septum.  No purulence noted. Oral:  Oral cavity and oropharynx are intact, symmetric, without erythema or edema.  Mucosa is moist without lesions. Neck: Full range of motion without pain.  There is no significant lymphadenopathy.  No masses palpable.  Thyroid  bed within normal limits to palpation.  Parotid glands and submandibular glands equal bilaterally without mass.  Trachea is midline. Neuro:  CN 2-12 grossly intact.   Procedure: Bilateral cerumen disimpaction Anesthesia: None Description: Under the operating microscope, the cerumen is carefully removed with a combination of cerumen currette, alligator forceps, and suction catheters.  After the cerumen is removed, the TMs are noted to be normal.  No mass, erythema, or lesions. The patient tolerated the procedure well.    Assessment: 1.  The patient has a 1 cm right supraorbital osteoma.  She is asymptomatic at this time. 2.   Incidental finding of bilateral cerumen impaction.  After the disimpaction procedure, both tympanic membranes and middle ear spaces are noted to be normal.  Plan: 1.  The physical exam findings and the CT images are reviewed with the patient. 2.  Otomicroscopy with bilateral cerumen disimpaction. 3.  Since the patient is currently asymptomatic, the decision is made to proceed with conservative observation. 4.  The patient will return for reevaluation in 6 months.  Dare Sanger W Quiara Killian 01/25/2024, 7:41 AM

## 2024-02-02 DIAGNOSIS — Z4689 Encounter for fitting and adjustment of other specified devices: Secondary | ICD-10-CM | POA: Diagnosis not present

## 2024-02-02 DIAGNOSIS — N898 Other specified noninflammatory disorders of vagina: Secondary | ICD-10-CM | POA: Diagnosis not present

## 2024-02-02 DIAGNOSIS — N952 Postmenopausal atrophic vaginitis: Secondary | ICD-10-CM | POA: Diagnosis not present

## 2024-02-13 ENCOUNTER — Other Ambulatory Visit: Payer: Self-pay | Admitting: Family Medicine

## 2024-02-13 DIAGNOSIS — Z1231 Encounter for screening mammogram for malignant neoplasm of breast: Secondary | ICD-10-CM

## 2024-02-13 DIAGNOSIS — M48062 Spinal stenosis, lumbar region with neurogenic claudication: Secondary | ICD-10-CM | POA: Diagnosis not present

## 2024-03-15 ENCOUNTER — Encounter: Payer: Self-pay | Admitting: Podiatry

## 2024-03-15 ENCOUNTER — Ambulatory Visit: Admitting: Podiatry

## 2024-03-15 DIAGNOSIS — B351 Tinea unguium: Secondary | ICD-10-CM

## 2024-03-15 DIAGNOSIS — M79676 Pain in unspecified toe(s): Secondary | ICD-10-CM | POA: Diagnosis not present

## 2024-03-15 NOTE — Progress Notes (Signed)
 She presents today chief complaint of painful elongated toenails 1 through 5 bilateral.  Objective: Toenails are long thick yellow dystrophic likely mycotic Sharp incurvated particularly the hallux left along the fibular border.  There is no erythema edema salines drainage or no purulence no malodor.  Otherwise toenails are long thick yellow dystrophic likely mycotic.  Assessment: Pain in limb secondary to onychomycosis and onychocryptosis.  Plan: Debridement of toenails 1 through 5 bilateral

## 2024-03-21 ENCOUNTER — Ambulatory Visit
Admission: RE | Admit: 2024-03-21 | Discharge: 2024-03-21 | Disposition: A | Discharge status: Home / Self Care | Source: Ambulatory Visit

## 2024-03-21 DIAGNOSIS — Z1231 Encounter for screening mammogram for malignant neoplasm of breast: Secondary | ICD-10-CM | POA: Diagnosis not present

## 2024-04-24 DIAGNOSIS — Z6826 Body mass index (BMI) 26.0-26.9, adult: Secondary | ICD-10-CM | POA: Diagnosis not present

## 2024-04-24 DIAGNOSIS — M48061 Spinal stenosis, lumbar region without neurogenic claudication: Secondary | ICD-10-CM | POA: Diagnosis not present

## 2024-04-24 DIAGNOSIS — R7689 Other specified abnormal immunological findings in serum: Secondary | ICD-10-CM | POA: Diagnosis not present

## 2024-04-24 DIAGNOSIS — E663 Overweight: Secondary | ICD-10-CM | POA: Diagnosis not present

## 2024-04-24 DIAGNOSIS — M1991 Primary osteoarthritis, unspecified site: Secondary | ICD-10-CM | POA: Diagnosis not present

## 2024-05-02 DIAGNOSIS — Z4689 Encounter for fitting and adjustment of other specified devices: Secondary | ICD-10-CM | POA: Diagnosis not present

## 2024-05-02 DIAGNOSIS — N949 Unspecified condition associated with female genital organs and menstrual cycle: Secondary | ICD-10-CM | POA: Diagnosis not present

## 2024-05-02 DIAGNOSIS — K5900 Constipation, unspecified: Secondary | ICD-10-CM | POA: Diagnosis not present

## 2024-05-21 NOTE — Telephone Encounter (Signed)
 Prolia  VOB initiated via [email or web address]  Next Prolia  inj DUE: 05/25/24

## 2024-05-25 ENCOUNTER — Ambulatory Visit

## 2024-05-25 DIAGNOSIS — M810 Age-related osteoporosis without current pathological fracture: Secondary | ICD-10-CM | POA: Diagnosis not present

## 2024-05-25 MED ORDER — DENOSUMAB 60 MG/ML ~~LOC~~ SOSY: 60.0000 mg | PREFILLED_SYRINGE | Freq: Once | SUBCUTANEOUS | Status: AC

## 2024-05-25 NOTE — Progress Notes (Signed)
 Pt was in office today for her proila inject given in the left arm with no problem. I advise pt to wait in lobby 30 mins in case of a reaction.

## 2024-05-28 ENCOUNTER — Ambulatory Visit: Admitting: Family Medicine

## 2024-05-29 ENCOUNTER — Ambulatory Visit: Admitting: Family Medicine

## 2024-05-29 ENCOUNTER — Encounter: Payer: Self-pay | Admitting: Family Medicine

## 2024-05-29 ENCOUNTER — Ambulatory Visit: Payer: Self-pay | Admitting: Family Medicine

## 2024-05-29 VITALS — BP 110/68 | HR 94 | Temp 97.2°F | Ht <= 58 in | Wt 128.0 lb

## 2024-05-29 DIAGNOSIS — M48061 Spinal stenosis, lumbar region without neurogenic claudication: Secondary | ICD-10-CM

## 2024-05-29 DIAGNOSIS — Z23 Encounter for immunization: Secondary | ICD-10-CM

## 2024-05-29 DIAGNOSIS — M810 Age-related osteoporosis without current pathological fracture: Secondary | ICD-10-CM

## 2024-05-29 DIAGNOSIS — I10 Essential (primary) hypertension: Secondary | ICD-10-CM

## 2024-05-29 DIAGNOSIS — Z131 Encounter for screening for diabetes mellitus: Secondary | ICD-10-CM | POA: Diagnosis not present

## 2024-05-29 DIAGNOSIS — G629 Polyneuropathy, unspecified: Secondary | ICD-10-CM | POA: Insufficient documentation

## 2024-05-29 DIAGNOSIS — E785 Hyperlipidemia, unspecified: Secondary | ICD-10-CM | POA: Diagnosis not present

## 2024-05-29 DIAGNOSIS — E663 Overweight: Secondary | ICD-10-CM

## 2024-05-29 LAB — COMPREHENSIVE METABOLIC PANEL WITH GFR
ALT: 17 U/L (ref 0–35)
AST: 27 U/L (ref 0–37)
Albumin: 4 g/dL (ref 3.5–5.2)
Alkaline Phosphatase: 36 U/L — ABNORMAL LOW (ref 39–117)
BUN: 31 mg/dL — ABNORMAL HIGH (ref 6–23)
CO2: 29 meq/L (ref 19–32)
Calcium: 9 mg/dL (ref 8.4–10.5)
Chloride: 104 meq/L (ref 96–112)
Creatinine, Ser: 1.07 mg/dL (ref 0.40–1.20)
GFR: 47.26 mL/min — ABNORMAL LOW (ref >60.00)
Glucose, Bld: 91 mg/dL (ref 70–99)
Potassium: 4 meq/L (ref 3.5–5.1)
Sodium: 138 meq/L (ref 135–145)
Total Bilirubin: 0.4 mg/dL (ref 0.2–1.2)
Total Protein: 6.6 g/dL (ref 6.0–8.3)

## 2024-05-29 LAB — CBC WITH DIFFERENTIAL/PLATELET
Basophils Absolute: 0 K/uL (ref 0.0–0.1)
Basophils Relative: 0.4 % (ref 0.0–3.0)
Eosinophils Absolute: 0.1 K/uL (ref 0.0–0.7)
Eosinophils Relative: 1.2 % (ref 0.0–5.0)
HCT: 37.4 % (ref 36.0–46.0)
Hemoglobin: 12.6 g/dL (ref 12.0–15.0)
Lymphocytes Relative: 15.4 % (ref 12.0–46.0)
Lymphs Abs: 1.1 K/uL (ref 0.7–4.0)
MCHC: 33.7 g/dL (ref 30.0–36.0)
MCV: 93.5 fl (ref 78.0–100.0)
Monocytes Absolute: 0.5 K/uL (ref 0.1–1.0)
Monocytes Relative: 7 % (ref 3.0–12.0)
Neutro Abs: 5.7 K/uL (ref 1.4–7.7)
Neutrophils Relative %: 76 % (ref 43.0–77.0)
Platelets: 168 K/uL (ref 150.0–400.0)
RBC: 3.99 Mil/uL (ref 3.87–5.11)
RDW: 13.6 % (ref 11.5–15.5)
WBC: 7.4 K/uL (ref 4.0–10.5)

## 2024-05-29 LAB — HEMOGLOBIN A1C: Hgb A1c MFr Bld: 5.4 % (ref 4.6–6.5)

## 2024-05-29 NOTE — Progress Notes (Signed)
 Phone [phone] In person visit   Subjective:   Abigail Cohen is a 83 y.o. 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 Barbaraann cardiology  05/04/2021    Priority: High   Abdominal aortic aneurysm (AAA) 08/28/2019    Priority: High   Neuropathy 05/29/2024    Priority: Medium    Hyperlipidemia 10/30/2020    Priority: Medium    Essential hypertension 10/30/2020    Priority: Medium    Osteoarthritis 10/30/2020    Priority: Medium    Osteoporosis 08/02/2016    Priority: Medium    Aortic atherosclerosis 11/05/2021    Priority: Low   Emphysema lung (HCC) 11/05/2021    Priority: Low   Diverticulosis 11/05/2021    Priority: Low   Allergic rhinitis 10/30/2020    Priority: Low   Left inguinal hernia 06/20/2012    Priority: Low   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     Alpha Lipoic Acid 200 MG CAPS See admin instructions.     aspirin  EC 81 MG tablet Take 1 tablet (81 mg total) by mouth daily.     BENFOTIAMINE PO Take 300 mg by mouth daily.     CALCIUM  CITRATE PO Take 1-2 tablets by mouth at bedtime.     Cholecalciferol (VITAMIN D3) 10 MCG (400 UNIT) tablet Take 400 Units by mouth daily.     denosumab  (PROLIA ) 60 MG/ML SOSY injection Inject 60 mg into the skin every 6 (six) months.     estradiol (ESTRACE) 0.1 MG/GM vaginal cream Place 1 Applicatorful vaginally 2 (two) times a week.       ibuprofen (ADVIL) 200 MG tablet 2 tabs     Magnesium  250 MG TABS Take 250 mg by mouth daily.      metoprolol  succinate (TOPROL -XL) 25 MG 24 hr tablet TAKE 1 TABLET(25 MG) BY MOUTH DAILY 90 tablet 2   Multiple Vitamin (MULTIVITAMIN WITH MINERALS) TABS tablet Take 1 tablet by mouth daily.     polyethylene glycol (MIRALAX / GLYCOLAX) 17 g packet 1/2 packet mixed with 8 ounces of fluid     rosuvastatin  (CRESTOR ) 10 MG tablet TAKE 1 TABLET(10 MG) BY MOUTH DAILY 90 tablet 3   gabapentin (NEURONTIN) 300 MG capsule Take 300 mg by mouth at bedtime.     Current Facility-Administered Medications  Medication Dose Route Frequency Provider Last Rate Last Admin   [START ON 11/22/2024] denosumab  (PROLIA ) injection 60 mg  60 mg Subcutaneous Once Gherghe, Cristina, MD         Objective:  BP 110/68   Pulse 94   Temp (!) 97.2 F (36.2 C) (Temporal)   Ht 4' 10 (1.473 m)   Wt 128 lb (58.1 kg)   LMP  (LMP Unknown)   SpO2 94%   BMI 26.75 kg/m  Gen: NAD, resting comfortably CV: RRR no murmurs rubs or gallops Lungs: CTAB no crackles, wheeze, rhonchi Ext: no edema Skin: warm,  dry Neuro: walks with walker    Assessment and Plan   #labs - wants sent to Dr. Ishmael-  # neuropathy- on alpha lipoic acid through Dr. Trixie originally patient reports. also sees podiatry and has hammer toes . some balance issues- does use a cane or walker. Came off gabapentin from neurosurgery and symptom(s) are worse . Known spinal stenosis -shed like to get neurology opinion- we placed a referral today for this.   #Dermatology evaluation- has new spot on hip that she wants to see Dr. Bonney again  #Coronary artery disease based on coronary artery calcification seen on CT chest with reassuring stress test and echocardiogram-follows with Dr. Vernice of cardiology #hyperlipidemia- LDL goal under 70 S: Medication:Rosuvastatin  10 mg, aspirin  81mg  -no chest pain. Stable shortness of breath  Lab Results  Component Value  Date   CHOL 123 11/24/2023   HDL 62.40 11/24/2023   LDLCALC 48 11/24/2023   TRIG 63.0 11/24/2023   CHOLHDL 2 11/24/2023  A/P: coronary artery disease asymptomatic continue current medications  Lipids at goal - continue current medications   #hypertension S: medication: Metoprolol  succinate 25 mg XR daily -home readings 110s to 120s BP Readings from Last 3 Encounters:  05/29/24 110/68  01/24/24 130/80  11/24/23 138/78  A/P: stable- continue current medicines    # Osteoporosis- sees Dr. Vianne S: Last DEXA: 06/24/22. She's due for repeat  Medication (bisphosphonate or prolia ):  prolia  started 2018. Had been on fosamax 2015-2017 without clear benefit .   Calcium : 1200mg  (through diet ok) recommended  Vitamin D : 1000 units a day recommended- vitamin D  loked good last visit  A/P: bone density now due for repeat- on Prolia  still- update DEXA at [facility] though I will message DrRONITA Trixie about [facility] center issues as prior DEXA was there.   #mildly low platelets- recheck today- other cell lines looked good   #high BUN- around 64 oz of beverage per day. Check levels today.   #constipation- worsening. Miralax 2/3 every day and 1/2 the next day. Tries to bulk fiber. 3-4 days of good days and then can get bad bouts - feels like needs to come out and doesn't- then gets diarrhea after several more days.  - no blood in stool or melena. No unintentional weight loss.   #Forehead polyp/supraorbital 1 or even 1.5 cm osteoma - saw Dr. Karis 01/24/24- observation  -recheck on wax as well  Recommended follow up: Return in about 6 months (around 11/26/2024) for followup or sooner if needed.Schedule b4 you leave. Future Appointments  Date Time Provider Department Center  06/14/2024  1:15 PM [place] T, [place] [facility]  07/31/2024  1:10 PM Karis Clunes, MD [facility] None  08/21/2024  1:00 PM Trixie File, MD [facility] None  10/03/2024  8:00 AM [facility]  11/23/2024  1:30 PM  [facility] NURSE [facility] None  12/03/2024 10:40 AM [facility] ANNUAL WELLNESS VISIT 1 [facility] Abigail Cohen    Lab/Order associations:   ICD-10-CM   1. Essential hypertension  I10 Comprehensive metabolic panel with GFR    CBC with Differential/Platelet    2. Hyperlipidemia, unspecified hyperlipidemia type  E78.5 Comprehensive metabolic panel with GFR    CBC with Differential/Platelet    3. Screening for diabetes mellitus  Z13.1 Hemoglobin A1c    4. Overweight  E66.3 Hemoglobin A1c    5. Osteoporosis without current pathological fracture, unspecified osteoporosis type  M81.0 DG Bone Density    6. Neuropathy  G62.9 Ambulatory referral to Neurology  7. Spinal stenosis of lumbar region, unspecified whether neurogenic claudication present  M48.061 Ambulatory referral to Neurology     No orders of the defined types were placed in this encounter.  Return precautions advised.  Garnette Lukes, MD

## 2024-05-29 NOTE — Patient Instructions (Addendum)
 Flu shot today  Schedule your bone density test at check out desk.  - located [address] across the street from [place] - in the basement - you DO NEED an appointment for the bone density tests.   Please stop by lab before you go If you have mychart- we will send your results within 3 business days of us  receiving them.  If you do not have mychart- we will call you about results within 5 business days of us  receiving them.  *please also note that you will see labs on mychart as soon as they post. I will later go in and write notes on them- will say notes from Dr. Katrinka   If I don't make a note about sending results to Dr. Ishmael please send me back a gentle reminder  Pull back on miralax manage half capful per day unless you start getting hard stools that are delayed again. If loose stool pull back on miralax for a day or two  We have placed a referral for you today to neurology- please call their # if you do not hear within a week (may be listed below or you may see mychart message within a few days with #).   For daughter- can schedule with me after last visit today if desired- would be 4 20 slot likely 4 40    Recommended follow up: Return in about 6 months (around 11/26/2024) for followup or sooner if needed.Schedule b4 you leave.

## 2024-05-29 NOTE — Addendum Note (Signed)
 Addended by: Jatasia Gundrum on: 05/29/2024 03:29 PM   Modules accepted: Orders

## 2024-05-30 NOTE — Telephone Encounter (Signed)
 Last Prolia  inj 05/25/24 Next Prolia  inj due 11/23/24

## 2024-05-30 NOTE — Telephone Encounter (Signed)
 [employer] Stable - Prior Authorization NOT required for [employer]

## 2024-06-04 ENCOUNTER — Other Ambulatory Visit: Payer: Self-pay | Admitting: Family Medicine

## 2024-06-04 DIAGNOSIS — I10 Essential (primary) hypertension: Secondary | ICD-10-CM

## 2024-06-07 ENCOUNTER — Encounter: Payer: Self-pay | Admitting: Neurology

## 2024-06-14 ENCOUNTER — Ambulatory Visit: Admitting: Podiatry

## 2024-06-19 ENCOUNTER — Ambulatory Visit: Admitting: Podiatry

## 2024-06-19 ENCOUNTER — Encounter: Payer: Self-pay | Admitting: Podiatry

## 2024-06-19 DIAGNOSIS — B351 Tinea unguium: Secondary | ICD-10-CM

## 2024-06-19 DIAGNOSIS — M79676 Pain in unspecified toe(s): Secondary | ICD-10-CM | POA: Diagnosis not present

## 2024-06-19 NOTE — Progress Notes (Signed)
 She presents today chief complaint of painful elongated toenails 1 through 5 bilateral.  Objective: Toenails are long thick yellow dystrophic likely mycotic Sharp incurvated particularly the hallux left along the fibular border.  There is no erythema edema salines drainage or no purulence no malodor.  Otherwise toenails are long thick yellow dystrophic likely mycotic.  Assessment: Pain in limb secondary to onychomycosis and onychocryptosis.  Plan: Debridement of toenails 1 through 5 bilateral

## 2024-06-25 ENCOUNTER — Ambulatory Visit (INDEPENDENT_AMBULATORY_CARE_PROVIDER_SITE_OTHER)
Admission: RE | Admit: 2024-06-25 | Discharge: 2024-06-25 | Disposition: A | Discharge status: Home / Self Care | Source: Ambulatory Visit | Attending: Family Medicine | Admitting: Family Medicine

## 2024-06-25 DIAGNOSIS — M810 Age-related osteoporosis without current pathological fracture: Secondary | ICD-10-CM

## 2024-07-03 ENCOUNTER — Other Ambulatory Visit (HOSPITAL_COMMUNITY)

## 2024-07-03 ENCOUNTER — Inpatient Hospital Stay: Admission: RE | Admit: 2024-07-03 | Source: Ambulatory Visit

## 2024-07-03 ENCOUNTER — Inpatient Hospital Stay: Admission: RE | Admit: 2024-07-03

## 2024-07-31 ENCOUNTER — Ambulatory Visit (INDEPENDENT_AMBULATORY_CARE_PROVIDER_SITE_OTHER): Admitting: Otolaryngology

## 2024-08-21 ENCOUNTER — Ambulatory Visit: Payer: Medicare Other | Admitting: Internal Medicine

## 2024-09-04 ENCOUNTER — Ambulatory Visit (INDEPENDENT_AMBULATORY_CARE_PROVIDER_SITE_OTHER): Admitting: Otolaryngology

## 2024-09-10 ENCOUNTER — Ambulatory Visit: Admitting: Neurology

## 2024-09-18 ENCOUNTER — Ambulatory Visit: Admitting: Podiatry

## 2024-10-03 ENCOUNTER — Other Ambulatory Visit (HOSPITAL_COMMUNITY)

## 2024-11-14 ENCOUNTER — Ambulatory Visit: Admitting: Cardiovascular Disease

## 2024-11-23 ENCOUNTER — Ambulatory Visit

## 2024-11-27 ENCOUNTER — Ambulatory Visit: Admitting: Family Medicine

## 2024-12-03 ENCOUNTER — Ambulatory Visit
